# Patient Record
Sex: Female | Born: 1945 | Race: Black or African American | Hispanic: No | Smoking: Former smoker
Health system: Southern US, Community
[De-identification: ages and names within clinical notes are randomized; demographics above are authoritative.]

## PROBLEM LIST (undated history)

## (undated) DIAGNOSIS — E119 Type 2 diabetes mellitus without complications: Secondary | ICD-10-CM

## (undated) DIAGNOSIS — I10 Essential (primary) hypertension: Secondary | ICD-10-CM

## (undated) DIAGNOSIS — R002 Palpitations: Secondary | ICD-10-CM

## (undated) DIAGNOSIS — I499 Cardiac arrhythmia, unspecified: Secondary | ICD-10-CM

## (undated) DIAGNOSIS — M1990 Unspecified osteoarthritis, unspecified site: Secondary | ICD-10-CM

## (undated) DIAGNOSIS — E1149 Type 2 diabetes mellitus with other diabetic neurological complication: Secondary | ICD-10-CM

## (undated) DIAGNOSIS — J9859 Other diseases of mediastinum, not elsewhere classified: Principal | ICD-10-CM

## (undated) DIAGNOSIS — D150 Benign neoplasm of thymus: Secondary | ICD-10-CM

## (undated) DIAGNOSIS — E7800 Pure hypercholesterolemia, unspecified: Secondary | ICD-10-CM

## (undated) HISTORY — DX: Palpitations: R00.2

## (undated) HISTORY — DX: Other diseases of mediastinum, not elsewhere classified: J98.59

## (undated) HISTORY — DX: Type 2 diabetes mellitus without complications: E11.9

## (undated) HISTORY — DX: Type 2 diabetes mellitus with other diabetic neurological complication: E11.49

## (undated) HISTORY — DX: Essential (primary) hypertension: I10

---

## 1999-12-13 ENCOUNTER — Encounter: Admission: RE | Admit: 1999-12-13 | Discharge: 2000-03-12 | Payer: Self-pay | Admitting: Family Medicine

## 2004-05-19 ENCOUNTER — Ambulatory Visit: Payer: Self-pay | Admitting: Internal Medicine

## 2004-06-09 ENCOUNTER — Ambulatory Visit: Payer: Self-pay | Admitting: Internal Medicine

## 2004-06-21 ENCOUNTER — Ambulatory Visit: Payer: Self-pay | Admitting: *Deleted

## 2004-07-10 ENCOUNTER — Ambulatory Visit: Payer: Self-pay | Admitting: Internal Medicine

## 2004-07-14 ENCOUNTER — Ambulatory Visit (HOSPITAL_COMMUNITY): Admission: RE | Admit: 2004-07-14 | Discharge: 2004-07-14 | Payer: Self-pay | Admitting: Internal Medicine

## 2004-07-24 ENCOUNTER — Ambulatory Visit (HOSPITAL_COMMUNITY): Admission: RE | Admit: 2004-07-24 | Discharge: 2004-07-24 | Payer: Self-pay | Admitting: Family Medicine

## 2004-07-27 ENCOUNTER — Ambulatory Visit: Payer: Self-pay | Admitting: Internal Medicine

## 2004-10-20 ENCOUNTER — Ambulatory Visit: Payer: Self-pay | Admitting: Family Medicine

## 2004-11-02 ENCOUNTER — Ambulatory Visit: Payer: Self-pay | Admitting: Internal Medicine

## 2004-12-14 ENCOUNTER — Ambulatory Visit: Payer: Self-pay | Admitting: Internal Medicine

## 2005-02-19 ENCOUNTER — Ambulatory Visit: Payer: Self-pay | Admitting: Internal Medicine

## 2005-09-14 ENCOUNTER — Ambulatory Visit: Payer: Self-pay | Admitting: Internal Medicine

## 2005-10-01 ENCOUNTER — Ambulatory Visit: Payer: Self-pay | Admitting: Internal Medicine

## 2005-10-03 ENCOUNTER — Ambulatory Visit: Payer: Self-pay | Admitting: Internal Medicine

## 2005-10-08 ENCOUNTER — Ambulatory Visit: Payer: Self-pay | Admitting: Internal Medicine

## 2006-01-07 ENCOUNTER — Ambulatory Visit: Payer: Self-pay | Admitting: Internal Medicine

## 2006-03-05 ENCOUNTER — Encounter: Payer: Self-pay | Admitting: Internal Medicine

## 2006-03-05 ENCOUNTER — Ambulatory Visit: Payer: Self-pay | Admitting: Internal Medicine

## 2006-04-16 ENCOUNTER — Ambulatory Visit: Payer: Self-pay | Admitting: Internal Medicine

## 2006-05-01 ENCOUNTER — Ambulatory Visit: Payer: Self-pay | Admitting: Internal Medicine

## 2006-05-06 ENCOUNTER — Ambulatory Visit: Payer: Self-pay | Admitting: Internal Medicine

## 2006-05-09 ENCOUNTER — Ambulatory Visit: Payer: Self-pay | Admitting: Internal Medicine

## 2006-07-02 ENCOUNTER — Ambulatory Visit: Payer: Self-pay | Admitting: Internal Medicine

## 2006-08-19 ENCOUNTER — Ambulatory Visit: Payer: Self-pay | Admitting: Internal Medicine

## 2006-11-01 ENCOUNTER — Ambulatory Visit: Payer: Self-pay | Admitting: Internal Medicine

## 2006-11-12 ENCOUNTER — Ambulatory Visit: Payer: Self-pay | Admitting: Internal Medicine

## 2006-11-14 ENCOUNTER — Ambulatory Visit (HOSPITAL_COMMUNITY): Admission: RE | Admit: 2006-11-14 | Discharge: 2006-11-14 | Payer: Self-pay | Admitting: Internal Medicine

## 2007-02-17 DIAGNOSIS — I10 Essential (primary) hypertension: Secondary | ICD-10-CM

## 2007-02-17 DIAGNOSIS — F17200 Nicotine dependence, unspecified, uncomplicated: Secondary | ICD-10-CM

## 2007-02-17 DIAGNOSIS — E669 Obesity, unspecified: Secondary | ICD-10-CM | POA: Insufficient documentation

## 2007-02-17 DIAGNOSIS — R319 Hematuria, unspecified: Secondary | ICD-10-CM | POA: Insufficient documentation

## 2007-02-17 DIAGNOSIS — E1149 Type 2 diabetes mellitus with other diabetic neurological complication: Secondary | ICD-10-CM | POA: Insufficient documentation

## 2007-02-17 DIAGNOSIS — E119 Type 2 diabetes mellitus without complications: Secondary | ICD-10-CM | POA: Insufficient documentation

## 2007-04-18 ENCOUNTER — Ambulatory Visit: Payer: Self-pay | Admitting: Internal Medicine

## 2007-05-28 ENCOUNTER — Encounter (INDEPENDENT_AMBULATORY_CARE_PROVIDER_SITE_OTHER): Payer: Self-pay | Admitting: *Deleted

## 2007-08-01 ENCOUNTER — Ambulatory Visit: Payer: Self-pay | Admitting: Family Medicine

## 2008-01-07 ENCOUNTER — Ambulatory Visit: Payer: Self-pay | Admitting: Internal Medicine

## 2008-01-07 ENCOUNTER — Encounter: Payer: Self-pay | Admitting: Family Medicine

## 2008-01-07 LAB — CONVERTED CEMR LAB
ALT: 12 units/L (ref 0–35)
AST: 14 units/L (ref 0–37)
Albumin: 3.9 g/dL (ref 3.5–5.2)
Alkaline Phosphatase: 57 units/L (ref 39–117)
BUN: 16 mg/dL (ref 6–23)
CO2: 26 meq/L (ref 19–32)
Calcium: 9.4 mg/dL (ref 8.4–10.5)
Chloride: 104 meq/L (ref 96–112)
Cholesterol: 138 mg/dL (ref 0–200)
Creatinine, Ser: 0.68 mg/dL (ref 0.40–1.20)
Glucose, Bld: 131 mg/dL — ABNORMAL HIGH (ref 70–99)
HDL: 39 mg/dL — ABNORMAL LOW (ref >39)
LDL Cholesterol: 81 mg/dL (ref 0–99)
Potassium: 4.1 meq/L (ref 3.5–5.3)
Sodium: 143 meq/L (ref 135–145)
Total Bilirubin: 0.4 mg/dL (ref 0.3–1.2)
Total CHOL/HDL Ratio: 3.5
Total Protein: 7 g/dL (ref 6.0–8.3)
Triglycerides: 90 mg/dL (ref <150)
VLDL: 18 mg/dL (ref 0–40)

## 2008-02-05 ENCOUNTER — Ambulatory Visit: Payer: Self-pay | Admitting: Internal Medicine

## 2008-02-20 ENCOUNTER — Ambulatory Visit: Payer: Self-pay | Admitting: Internal Medicine

## 2008-04-10 ENCOUNTER — Emergency Department (HOSPITAL_COMMUNITY): Admission: EM | Admit: 2008-04-10 | Discharge: 2008-04-10 | Payer: Self-pay | Admitting: Emergency Medicine

## 2008-04-30 ENCOUNTER — Ambulatory Visit: Payer: Self-pay | Admitting: Internal Medicine

## 2008-09-22 ENCOUNTER — Ambulatory Visit: Payer: Self-pay | Admitting: Internal Medicine

## 2008-09-22 LAB — CONVERTED CEMR LAB
BUN: 15 mg/dL (ref 6–23)
CO2: 22 meq/L (ref 19–32)
Calcium: 9.3 mg/dL (ref 8.4–10.5)
Chloride: 102 meq/L (ref 96–112)
Cholesterol: 147 mg/dL (ref 0–200)
Creatinine, Ser: 0.62 mg/dL (ref 0.40–1.20)
Glucose, Bld: 101 mg/dL — ABNORMAL HIGH (ref 70–99)
HDL: 40 mg/dL (ref >39)
LDL Cholesterol: 89 mg/dL (ref 0–99)
Potassium: 4.2 meq/L (ref 3.5–5.3)
Sodium: 142 meq/L (ref 135–145)
Total CHOL/HDL Ratio: 3.7
Triglycerides: 92 mg/dL (ref <150)
VLDL: 18 mg/dL (ref 0–40)

## 2008-10-07 ENCOUNTER — Ambulatory Visit (HOSPITAL_COMMUNITY): Admission: RE | Admit: 2008-10-07 | Discharge: 2008-10-07 | Payer: Self-pay | Admitting: Ophthalmology

## 2009-01-01 ENCOUNTER — Emergency Department (HOSPITAL_COMMUNITY): Admission: EM | Admit: 2009-01-01 | Discharge: 2009-01-01 | Payer: Self-pay | Admitting: Emergency Medicine

## 2009-03-29 ENCOUNTER — Ambulatory Visit: Payer: Self-pay | Admitting: Internal Medicine

## 2009-04-08 ENCOUNTER — Ambulatory Visit (HOSPITAL_COMMUNITY): Admission: RE | Admit: 2009-04-08 | Discharge: 2009-04-08 | Payer: Self-pay | Admitting: Internal Medicine

## 2009-04-26 ENCOUNTER — Ambulatory Visit (HOSPITAL_COMMUNITY): Admission: RE | Admit: 2009-04-26 | Discharge: 2009-04-26 | Payer: Self-pay | Admitting: Ophthalmology

## 2009-05-09 ENCOUNTER — Ambulatory Visit: Payer: Self-pay | Admitting: Internal Medicine

## 2009-05-09 LAB — CONVERTED CEMR LAB
ALT: 15 units/L (ref 0–35)
AST: 18 units/L (ref 0–37)
Albumin: 4 g/dL (ref 3.5–5.2)
Alkaline Phosphatase: 56 units/L (ref 39–117)
BUN: 24 mg/dL — ABNORMAL HIGH (ref 6–23)
Basophils Absolute: 0 10*3/uL (ref 0.0–0.1)
Basophils Relative: 0 % (ref 0–1)
CO2: 25 meq/L (ref 19–32)
Calcium: 9.6 mg/dL (ref 8.4–10.5)
Chloride: 104 meq/L (ref 96–112)
Cholesterol: 143 mg/dL (ref 0–200)
Creatinine, Ser: 0.76 mg/dL (ref 0.40–1.20)
Eosinophils Absolute: 0.2 10*3/uL (ref 0.0–0.7)
Eosinophils Relative: 3 % (ref 0–5)
Glucose, Bld: 120 mg/dL — ABNORMAL HIGH (ref 70–99)
HCT: 37.9 % (ref 36.0–46.0)
HDL: 38 mg/dL — ABNORMAL LOW (ref >39)
Hemoglobin: 12.2 g/dL (ref 12.0–15.0)
LDL Cholesterol: 87 mg/dL (ref 0–99)
Lymphocytes Relative: 46 % (ref 12–46)
Lymphs Abs: 3 10*3/uL (ref 0.7–4.0)
MCHC: 32.2 g/dL (ref 30.0–36.0)
MCV: 92.2 fL (ref 78.0–100.0)
Monocytes Absolute: 0.5 10*3/uL (ref 0.1–1.0)
Monocytes Relative: 7 % (ref 3–12)
Neutro Abs: 2.8 10*3/uL (ref 1.7–7.7)
Neutrophils Relative %: 44 % (ref 43–77)
Platelets: 316 10*3/uL (ref 150–400)
Potassium: 4.6 meq/L (ref 3.5–5.3)
RBC: 4.11 M/uL (ref 3.87–5.11)
RDW: 15.3 % (ref 11.5–15.5)
Sodium: 141 meq/L (ref 135–145)
Total Bilirubin: 0.4 mg/dL (ref 0.3–1.2)
Total CHOL/HDL Ratio: 3.8
Total Protein: 7.2 g/dL (ref 6.0–8.3)
Triglycerides: 92 mg/dL (ref <150)
VLDL: 18 mg/dL (ref 0–40)
Vit D, 25-Hydroxy: 13 ng/mL — ABNORMAL LOW (ref 30–89)
WBC: 6.5 10*3/uL (ref 4.0–10.5)

## 2009-05-25 ENCOUNTER — Ambulatory Visit: Payer: Self-pay | Admitting: Internal Medicine

## 2009-06-16 ENCOUNTER — Ambulatory Visit (HOSPITAL_COMMUNITY): Admission: RE | Admit: 2009-06-16 | Discharge: 2009-06-16 | Payer: Self-pay | Admitting: Ophthalmology

## 2009-07-28 ENCOUNTER — Ambulatory Visit: Payer: Self-pay | Admitting: Internal Medicine

## 2009-09-10 HISTORY — PX: OTHER SURGICAL HISTORY: SHX169

## 2009-10-24 ENCOUNTER — Ambulatory Visit: Payer: Self-pay | Admitting: Family Medicine

## 2010-05-02 ENCOUNTER — Ambulatory Visit: Payer: Self-pay | Admitting: Internal Medicine

## 2010-07-04 ENCOUNTER — Ambulatory Visit: Payer: Self-pay | Admitting: Internal Medicine

## 2010-07-26 ENCOUNTER — Ambulatory Visit: Payer: Self-pay | Admitting: Internal Medicine

## 2010-12-14 LAB — GLUCOSE, CAPILLARY
Glucose-Capillary: 101 mg/dL — ABNORMAL HIGH (ref 70–99)
Glucose-Capillary: 97 mg/dL (ref 70–99)

## 2010-12-14 LAB — URINALYSIS, ROUTINE W REFLEX MICROSCOPIC
Bilirubin Urine: NEGATIVE
Glucose, UA: NEGATIVE mg/dL
Ketones, ur: NEGATIVE mg/dL
Leukocytes, UA: NEGATIVE
Nitrite: NEGATIVE
Protein, ur: NEGATIVE mg/dL
Specific Gravity, Urine: 1.021 (ref 1.005–1.030)
Urobilinogen, UA: 0.2 mg/dL (ref 0.0–1.0)
pH: 5.5 (ref 5.0–8.0)

## 2010-12-14 LAB — CBC
HCT: 36.7 % (ref 36.0–46.0)
Hemoglobin: 12.3 g/dL (ref 12.0–15.0)
MCHC: 33.4 g/dL (ref 30.0–36.0)
MCV: 93.4 fL (ref 78.0–100.0)
Platelets: 277 10*3/uL (ref 150–400)
RBC: 3.93 MIL/uL (ref 3.87–5.11)
RDW: 14.2 % (ref 11.5–15.5)
WBC: 6.8 10*3/uL (ref 4.0–10.5)

## 2010-12-14 LAB — COMPREHENSIVE METABOLIC PANEL
ALT: 16 U/L (ref 0–35)
AST: 19 U/L (ref 0–37)
Albumin: 3.5 g/dL (ref 3.5–5.2)
Alkaline Phosphatase: 57 U/L (ref 39–117)
BUN: 15 mg/dL (ref 6–23)
CO2: 34 mEq/L — ABNORMAL HIGH (ref 19–32)
Calcium: 10.2 mg/dL (ref 8.4–10.5)
Chloride: 102 mEq/L (ref 96–112)
Creatinine, Ser: 0.73 mg/dL (ref 0.4–1.2)
GFR calc Af Amer: >60 mL/min (ref >60)
GFR calc non Af Amer: >60 mL/min (ref >60)
Glucose, Bld: 191 mg/dL — ABNORMAL HIGH (ref 70–99)
Potassium: 4.3 mEq/L (ref 3.5–5.1)
Sodium: 140 mEq/L (ref 135–145)
Total Bilirubin: 0.6 mg/dL (ref 0.3–1.2)
Total Protein: 6.8 g/dL (ref 6.0–8.3)

## 2010-12-14 LAB — URINE MICROSCOPIC-ADD ON

## 2010-12-14 LAB — DIFFERENTIAL
Basophils Absolute: 0 10*3/uL (ref 0.0–0.1)
Basophils Relative: 0 % (ref 0–1)
Eosinophils Absolute: 0.2 10*3/uL (ref 0.0–0.7)
Eosinophils Relative: 3 % (ref 0–5)
Lymphocytes Relative: 38 % (ref 12–46)
Lymphs Abs: 2.6 10*3/uL (ref 0.7–4.0)
Monocytes Absolute: 0.5 10*3/uL (ref 0.1–1.0)
Monocytes Relative: 8 % (ref 3–12)
Neutro Abs: 3.4 10*3/uL (ref 1.7–7.7)
Neutrophils Relative %: 51 % (ref 43–77)

## 2010-12-25 LAB — URINALYSIS, ROUTINE W REFLEX MICROSCOPIC
Bilirubin Urine: NEGATIVE
Glucose, UA: NEGATIVE mg/dL
Ketones, ur: NEGATIVE mg/dL
Leukocytes, UA: NEGATIVE
Nitrite: NEGATIVE
Protein, ur: NEGATIVE mg/dL
Specific Gravity, Urine: 1.018 (ref 1.005–1.030)
Urobilinogen, UA: 1 mg/dL (ref 0.0–1.0)
pH: 5.5 (ref 5.0–8.0)

## 2010-12-25 LAB — BASIC METABOLIC PANEL
BUN: 16 mg/dL (ref 6–23)
CO2: 31 mEq/L (ref 19–32)
Calcium: 10 mg/dL (ref 8.4–10.5)
Chloride: 103 mEq/L (ref 96–112)
Creatinine, Ser: 0.64 mg/dL (ref 0.4–1.2)
GFR calc Af Amer: >60 mL/min (ref >60)
GFR calc non Af Amer: >60 mL/min (ref >60)
Glucose, Bld: 123 mg/dL — ABNORMAL HIGH (ref 70–99)
Potassium: 4.9 mEq/L (ref 3.5–5.1)
Sodium: 141 mEq/L (ref 135–145)

## 2010-12-25 LAB — CBC
HCT: 40.7 % (ref 36.0–46.0)
Hemoglobin: 13.2 g/dL (ref 12.0–15.0)
MCHC: 32.4 g/dL (ref 30.0–36.0)
MCV: 91.3 fL (ref 78.0–100.0)
Platelets: 285 10*3/uL (ref 150–400)
RBC: 4.45 MIL/uL (ref 3.87–5.11)
RDW: 14.3 % (ref 11.5–15.5)
WBC: 7.6 10*3/uL (ref 4.0–10.5)

## 2010-12-25 LAB — URINE MICROSCOPIC-ADD ON

## 2010-12-25 LAB — GLUCOSE, CAPILLARY
Glucose-Capillary: 103 mg/dL — ABNORMAL HIGH (ref 70–99)
Glucose-Capillary: 115 mg/dL — ABNORMAL HIGH (ref 70–99)
Glucose-Capillary: 94 mg/dL (ref 70–99)

## 2011-01-26 NOTE — Op Note (Signed)
Samantha [name], Samantha [name]              ACCOUNT NO.:  [id number]   MEDICAL RECORD NO.:  [id number]          PATIENT TYPE:  AMB   LOCATION:  SDS                          FACILITY:  [facility]   PHYSICIAN:  Salley Scarlet., M.D.DATE OF BIRTH:  09-25-1945   DATE OF PROCEDURE:  DATE OF DISCHARGE:  10/07/2008                               OPERATIVE REPORT   PREOPERATIVE DIAGNOSIS:  Immature cataract, right eye.   POSTOPERATIVE DIAGNOSIS:  Immature cataract, right eye.   OPERATION:  Kelman phacoemulsification cataract, right eye.   ANESTHESIA:  Local using Xylocaine, 2% with Marcaine, 0.75% [place].   JUSTIFICATION OF PROCEDURE:  This is a 65 year old diabetic lady who  complains of blurring of vision with difficulty seeing to read and  drive.  She was evaluated and found to have bilateral posterior  subcapsular cataracts, slightly worse on the right than the left with a  visual acuity best corrected to 20/200 in each eye.  She has a history  of diabetic eye disease and she has had laser surgery for the treatment  of diabetic retinopathy.  Because of worsening in her visual acuity,  cataract extraction with intraocular lens implantation was recommended.  She is admitted at this time for that purpose.   PROCEDURE:  Under the influence of IV sedation, a Van Lint akinesia and  retrobulbar anesthesia was given.  The patient was prepped and draped in  the usual manner.  The lid speculum was inserted under the upper and  lower lid of the right eye and a 4-0 silk traction suture was passed  through the belly of the superior rectus muscle for traction.  A fornix-  based conjunctival flap was turned and hemostasis achieved by using  cautery.  An incision was made in the sclera at the limbus.  This  incision was dissected down into the cornea using a crescent blade.  A  sideport incision was made at the 1:30 o'clock position.  OcuCoat was  injected into the eye through the sideport incision.  The  anterior  chamber was then entered through the corneoscleral tunnel incision at  the 11:30 o'clock position using a 2.75-mm keratome.  An anterior  capsulotomy was done using a bent 25-gauge needle.  The nucleus was  hydrodissected using Xylocaine.  The KPE handpiece was passed into the  eye and the nucleus was emulsified without difficulty.  The residual  cortical material was aspirated.  The posterior capsule was polished  using olive-tip polisher.  The wound was widened slightly to accommodate  a foldable silicone lens.  The lens was seated into the eye behind the  iris without difficulty.  The anterior chamber was reformed and the  pupils was constricted using Miochol.  The lips of the wound were  hydrated and tested to make sure that there was no leak.  After  ascertaining that there is no leak, the conjunctiva was closed over the  wound using thermal cautery.  A 1 mL of Celestone, 0.5 mL of gentamicin  were then injected subconjunctivally.  Maxitrol ophthalmic ointment and  prilocaine ointment were applied along with a patch and  Fox shield.  The  patient tolerated the procedure well and was discharged to the  post anesthesia recovery in a satisfactory condition.  She was  instructed to rest today, to take Tylenol every 4 hours as needed for  pain, and see me in office tomorrow for further evaluation.   DISCHARGE DIAGNOSIS:  Immature cataract, right eye.      Salley Scarlet., M.D.  Electronically Signed     TB/MEDQ  D:  10/18/2008  T:  10/19/2008  Job:  [id number]

## 2011-05-03 ENCOUNTER — Emergency Department (HOSPITAL_COMMUNITY): Payer: Self-pay

## 2011-05-03 ENCOUNTER — Emergency Department (HOSPITAL_COMMUNITY)
Admission: EM | Admit: 2011-05-03 | Discharge: 2011-05-04 | Disposition: A | Discharge status: Home / Self Care | Payer: Self-pay | Attending: Emergency Medicine | Admitting: Emergency Medicine

## 2011-05-03 DIAGNOSIS — E119 Type 2 diabetes mellitus without complications: Secondary | ICD-10-CM | POA: Insufficient documentation

## 2011-05-03 DIAGNOSIS — E7800 Pure hypercholesterolemia, unspecified: Secondary | ICD-10-CM | POA: Insufficient documentation

## 2011-05-03 DIAGNOSIS — I10 Essential (primary) hypertension: Secondary | ICD-10-CM | POA: Insufficient documentation

## 2011-05-03 DIAGNOSIS — R002 Palpitations: Secondary | ICD-10-CM | POA: Insufficient documentation

## 2011-05-03 DIAGNOSIS — I498 Other specified cardiac arrhythmias: Secondary | ICD-10-CM | POA: Insufficient documentation

## 2011-05-03 DIAGNOSIS — R350 Frequency of micturition: Secondary | ICD-10-CM | POA: Insufficient documentation

## 2011-05-03 LAB — COMPREHENSIVE METABOLIC PANEL
ALT: 16 U/L (ref 0–35)
AST: 17 U/L (ref 0–37)
Alkaline Phosphatase: 65 U/L (ref 39–117)
GFR calc Af Amer: >60 mL/min (ref >60)
Glucose, Bld: 153 mg/dL — ABNORMAL HIGH (ref 70–99)
Potassium: 4.4 mEq/L (ref 3.5–5.1)
Sodium: 135 mEq/L (ref 135–145)
Total Protein: 7.5 g/dL (ref 6.0–8.3)

## 2011-05-03 LAB — DIFFERENTIAL
Eosinophils Absolute: 0.1 10*3/uL (ref 0.0–0.7)
Lymphocytes Relative: 25 % (ref 12–46)
Lymphs Abs: 2.8 10*3/uL (ref 0.7–4.0)
Neutrophils Relative %: 66 % (ref 43–77)

## 2011-05-03 LAB — URINALYSIS, ROUTINE W REFLEX MICROSCOPIC
Bilirubin Urine: NEGATIVE
Glucose, UA: NEGATIVE mg/dL
Ketones, ur: NEGATIVE mg/dL
Nitrite: NEGATIVE
pH: 5.5 (ref 5.0–8.0)

## 2011-05-03 LAB — CBC
HCT: 35.8 % — ABNORMAL LOW (ref 36.0–46.0)
MCV: 88.6 fL (ref 78.0–100.0)
Platelets: 309 10*3/uL (ref 150–400)
RBC: 4.04 MIL/uL (ref 3.87–5.11)
WBC: 10.9 10*3/uL — ABNORMAL HIGH (ref 4.0–10.5)

## 2011-05-03 LAB — URINE MICROSCOPIC-ADD ON

## 2011-05-03 LAB — POCT I-STAT TROPONIN I

## 2011-06-27 ENCOUNTER — Encounter: Payer: Self-pay | Admitting: *Deleted

## 2011-06-27 ENCOUNTER — Encounter: Payer: Self-pay | Admitting: Cardiovascular Disease

## 2011-06-28 ENCOUNTER — Ambulatory Visit (INDEPENDENT_AMBULATORY_CARE_PROVIDER_SITE_OTHER): Payer: Self-pay | Admitting: Cardiovascular Disease

## 2011-06-28 ENCOUNTER — Encounter: Payer: Self-pay | Admitting: Cardiovascular Disease

## 2011-06-28 VITALS — BP 127/74 | HR 99 | Ht 62.0 in | Wt 233.0 lb

## 2011-06-28 DIAGNOSIS — I10 Essential (primary) hypertension: Secondary | ICD-10-CM

## 2011-06-28 DIAGNOSIS — R002 Palpitations: Secondary | ICD-10-CM | POA: Insufficient documentation

## 2011-06-28 DIAGNOSIS — R0600 Dyspnea, unspecified: Secondary | ICD-10-CM | POA: Insufficient documentation

## 2011-06-28 DIAGNOSIS — R0989 Other specified symptoms and signs involving the circulatory and respiratory systems: Secondary | ICD-10-CM

## 2011-06-28 DIAGNOSIS — E119 Type 2 diabetes mellitus without complications: Secondary | ICD-10-CM

## 2011-06-28 NOTE — Assessment & Plan Note (Signed)
Likely functional from obesity  Echo

## 2011-06-28 NOTE — Patient Instructions (Signed)
Your physician recommends that you schedule a follow-up appointment in: AS NEEDED Your physician recommends that you continue on your current medications as directed. Please refer to the Current Medication list given to you today.  Your physician has requested that you have an echocardiogram. Echocardiography is a painless test that uses sound waves to create images of your heart. It provides your doctor with information about the size and shape of your heart and how well your heart's chambers and valves are working. This procedure takes approximately one hour. There are no restrictions for this procedure. DX. PALPITATIONS

## 2011-06-28 NOTE — Progress Notes (Signed)
65 yo obese diabetic referred by [facility].  Isolated episode of palpitations in August.  Lasted a few minutes.  No associated SSCP syncope or dyspnea.  Mild chronic dyspnea that sounds functional.  Obese with poor diet and sedentary.  Doesn't do much but watch t.v.  She has no previous history of heart disease.  No previous w/u.  Does not need monitor since she has not had any recurrent episodes of palpitations since August.  Mostly compliant with meds.  Does not check her BS everyday.    ROS: Denies fever, malais, weight loss, blurry vision, decreased visual acuity, cough, sputum, SOB, hemoptysis, pleuritic pain, palpitaitons, heartburn, abdominal pain, melena, lower extremity edema, claudication, or rash.  All other systems reviewed and negative   General: Affect appropriate Healthy:  appears stated age HEENT: normal Neck supple with no adenopathy JVP normal no bruits no thyromegaly Lungs clear with no wheezing and good diaphragmatic motion Heart:  S1/S2 no murmur,rub, gallop or click PMI normal Abdomen: benighn, BS positve, no tenderness, no AAA no bruit.  No HSM or HJR Distal pulses intact with no bruits No edema Neuro non-focal Skin warm and dry No muscular weakness  Medications Current Outpatient Prescriptions  Medication Sig Dispense Refill  . acetaminophen (TYLENOL) 500 MG tablet Take 500 mg by mouth every 6 (six) hours as needed.        Marland Kitchen aspirin 81 MG tablet Take 81 mg by mouth daily.        Marland Kitchen atorvastatin (LIPITOR) 10 MG tablet Take 10 mg by mouth daily.        Marland Kitchen glipiZIDE (GLUCOTROL) 10 MG tablet Take 10 mg by mouth daily.        Marland Kitchen lisinopril-hydrochlorothiazide (PRINZIDE,ZESTORETIC) 20-25 MG per tablet Take 1 tablet by mouth daily.        Marland Kitchen loratadine (CLARITIN) 10 MG tablet Take 10 mg by mouth daily.        . metFORMIN (GLUCOPHAGE) 1000 MG tablet Take 1,000 mg by mouth 2 (two) times daily with a meal.          Allergies Review of patient's allergies  indicates no known allergies.  Family History: No family history on file.  Social History: History   Social History  . Marital Status: Married    Spouse Name: N/A    Number of Children: N/A  . Years of Education: N/A   Occupational History  . Not on file.   Social History Main Topics  . Smoking status: Former Games developer  . Smokeless tobacco: Not on file  . Alcohol Use: Not on file  . Drug Use: Not on file  . Sexually Active: Not on file   Other Topics Concern  . Not on file   Social History Narrative  . No narrative on file    Electrocardiogram:  :04/18/11  Primary office.  ST 100 borderline LVH  Today  NSR 99 [facility]  Assessment and Plan

## 2011-06-28 NOTE — Assessment & Plan Note (Signed)
Functional check echo to make sure EF normal

## 2011-06-28 NOTE — Assessment & Plan Note (Signed)
Isolated episode.  No need for monitor. Echo given dyspnea and diabetes

## 2011-06-28 NOTE — Assessment & Plan Note (Signed)
Discussed low carb diet.  Obese.  Not motivated to loose weight.  Sedentary.  Continue current meds.  Target A1c 6.5 or less

## 2011-06-28 NOTE — Assessment & Plan Note (Signed)
Well controlled.  Continue current medications and low sodium Dash type diet.    

## 2011-07-05 ENCOUNTER — Ambulatory Visit (HOSPITAL_COMMUNITY): Payer: Self-pay | Attending: Cardiovascular Disease | Admitting: Radiology

## 2011-07-05 DIAGNOSIS — E119 Type 2 diabetes mellitus without complications: Secondary | ICD-10-CM | POA: Insufficient documentation

## 2011-07-05 DIAGNOSIS — I10 Essential (primary) hypertension: Secondary | ICD-10-CM | POA: Insufficient documentation

## 2011-07-05 DIAGNOSIS — I059 Rheumatic mitral valve disease, unspecified: Secondary | ICD-10-CM | POA: Insufficient documentation

## 2011-07-05 DIAGNOSIS — R002 Palpitations: Secondary | ICD-10-CM | POA: Insufficient documentation

## 2011-07-10 ENCOUNTER — Other Ambulatory Visit (HOSPITAL_COMMUNITY): Payer: Self-pay | Admitting: Radiology

## 2011-08-06 ENCOUNTER — Encounter: Payer: Self-pay | Admitting: Cardiovascular Disease

## 2011-10-18 ENCOUNTER — Emergency Department (HOSPITAL_COMMUNITY): Payer: Self-pay

## 2011-10-18 ENCOUNTER — Other Ambulatory Visit: Payer: Self-pay

## 2011-10-18 ENCOUNTER — Encounter (HOSPITAL_COMMUNITY): Payer: Self-pay | Admitting: Emergency Medicine

## 2011-10-18 ENCOUNTER — Inpatient Hospital Stay (HOSPITAL_COMMUNITY)
Admission: EM | Admit: 2011-10-18 | Discharge: 2011-10-19 | DRG: 641 | Disposition: A | Discharge status: Home / Self Care | Payer: Self-pay | Attending: Family Medicine | Admitting: Family Medicine

## 2011-10-18 DIAGNOSIS — R002 Palpitations: Secondary | ICD-10-CM | POA: Diagnosis present

## 2011-10-18 DIAGNOSIS — I10 Essential (primary) hypertension: Secondary | ICD-10-CM

## 2011-10-18 DIAGNOSIS — E871 Hypo-osmolality and hyponatremia: Principal | ICD-10-CM | POA: Diagnosis present

## 2011-10-18 DIAGNOSIS — J9859 Other diseases of mediastinum, not elsewhere classified: Secondary | ICD-10-CM

## 2011-10-18 DIAGNOSIS — E861 Hypovolemia: Secondary | ICD-10-CM | POA: Diagnosis present

## 2011-10-18 DIAGNOSIS — R222 Localized swelling, mass and lump, trunk: Secondary | ICD-10-CM

## 2011-10-18 DIAGNOSIS — E118 Type 2 diabetes mellitus with unspecified complications: Secondary | ICD-10-CM | POA: Diagnosis present

## 2011-10-18 DIAGNOSIS — Z79899 Other long term (current) drug therapy: Secondary | ICD-10-CM

## 2011-10-18 DIAGNOSIS — Z87891 Personal history of nicotine dependence: Secondary | ICD-10-CM

## 2011-10-18 DIAGNOSIS — E119 Type 2 diabetes mellitus without complications: Secondary | ICD-10-CM

## 2011-10-18 HISTORY — DX: Other diseases of mediastinum, not elsewhere classified: J98.59

## 2011-10-18 HISTORY — DX: Cardiac arrhythmia, unspecified: I49.9

## 2011-10-18 LAB — HEMOGLOBIN A1C: Hgb A1c MFr Bld: 6.6 % — ABNORMAL HIGH (ref <5.7)

## 2011-10-18 LAB — URINALYSIS, ROUTINE W REFLEX MICROSCOPIC
Bilirubin Urine: NEGATIVE
Nitrite: NEGATIVE
Specific Gravity, Urine: 1.006 (ref 1.005–1.030)
Urobilinogen, UA: 0.2 mg/dL (ref 0.0–1.0)
pH: 6 (ref 5.0–8.0)

## 2011-10-18 LAB — BASIC METABOLIC PANEL
BUN: 24 mg/dL — ABNORMAL HIGH (ref 6–23)
Calcium: 10 mg/dL (ref 8.4–10.5)
Chloride: 88 mEq/L — ABNORMAL LOW (ref 96–112)
GFR calc Af Amer: 62 mL/min — ABNORMAL LOW (ref >90)
GFR calc non Af Amer: 53 mL/min — ABNORMAL LOW (ref >90)
GFR calc non Af Amer: 56 mL/min — ABNORMAL LOW (ref >90)
Glucose, Bld: 152 mg/dL — ABNORMAL HIGH (ref 70–99)
Glucose, Bld: 112 mg/dL — ABNORMAL HIGH (ref 70–99)
Potassium: 4.9 mEq/L (ref 3.5–5.1)
Sodium: 123 mEq/L — ABNORMAL LOW (ref 135–145)
Sodium: 131 mEq/L — ABNORMAL LOW (ref 135–145)

## 2011-10-18 LAB — GLUCOSE, CAPILLARY
Glucose-Capillary: 87 mg/dL (ref 70–99)
Glucose-Capillary: 108 mg/dL — ABNORMAL HIGH (ref 70–99)
Glucose-Capillary: 93 mg/dL (ref 70–99)
Glucose-Capillary: 88 mg/dL (ref 70–99)

## 2011-10-18 LAB — DIFFERENTIAL
Lymphs Abs: 2.2 10*3/uL (ref 0.7–4.0)
Monocytes Relative: 9 % (ref 3–12)
Neutro Abs: 4.2 10*3/uL (ref 1.7–7.7)
Neutrophils Relative %: 59 % (ref 43–77)

## 2011-10-18 LAB — CREATININE, SERUM
Creatinine, Ser: 1.02 mg/dL (ref 0.50–1.10)
GFR calc Af Amer: 66 mL/min — ABNORMAL LOW (ref >90)

## 2011-10-18 LAB — CBC
Hemoglobin: 12.1 g/dL (ref 12.0–15.0)
Platelets: 305 10*3/uL (ref 150–400)
RBC: 4.02 MIL/uL (ref 3.87–5.11)
WBC: 7 10*3/uL (ref 4.0–10.5)

## 2011-10-18 LAB — TSH: TSH: 1.247 u[IU]/mL (ref 0.350–4.500)

## 2011-10-18 LAB — OSMOLALITY, URINE: Osmolality, Ur: 166 mOsm/kg — ABNORMAL LOW (ref 390–1090)

## 2011-10-18 LAB — URINE MICROSCOPIC-ADD ON

## 2011-10-18 MED ORDER — IOHEXOL 300 MG/ML  SOLN
100.0000 mL | Freq: Once | INTRAMUSCULAR | Status: AC | PRN
  Administered 2011-10-18: 100 mL via INTRAVENOUS

## 2011-10-18 MED ORDER — ROSUVASTATIN CALCIUM 10 MG PO TABS
10.0000 mg | ORAL_TABLET | Freq: Every day | ORAL | Status: DC
  Administered 2011-10-18: 10 mg via ORAL
  Filled 2011-10-18 (×2): qty 1

## 2011-10-18 MED ORDER — LORATADINE 10 MG PO TABS
10.0000 mg | ORAL_TABLET | Freq: Every day | ORAL | Status: DC
  Administered 2011-10-18 – 2011-10-19 (×2): 10 mg via ORAL
  Filled 2011-10-18 (×2): qty 1

## 2011-10-18 MED ORDER — SODIUM CHLORIDE 0.9 % IV SOLN
INTRAVENOUS | Status: DC
  Administered 2011-10-18: 125 mL/h via INTRAVENOUS
  Administered 2011-10-18: 75 mL/h via INTRAVENOUS
  Administered 2011-10-19: 03:00:00 via INTRAVENOUS

## 2011-10-18 MED ORDER — HEPARIN SODIUM (PORCINE) 5000 UNIT/ML IJ SOLN
5000.0000 [IU] | Freq: Three times a day (TID) | INTRAMUSCULAR | Status: DC
  Administered 2011-10-18 – 2011-10-19 (×5): 5000 [IU] via SUBCUTANEOUS
  Filled 2011-10-18 (×7): qty 1

## 2011-10-18 MED ORDER — ACETAMINOPHEN 325 MG PO TABS
650.0000 mg | ORAL_TABLET | Freq: Four times a day (QID) | ORAL | Status: DC | PRN
  Administered 2011-10-18: 650 mg via ORAL
  Filled 2011-10-18: qty 2

## 2011-10-18 MED ORDER — ACETAMINOPHEN 500 MG PO TABS: 500.0000 mg | ORAL_TABLET | Freq: Four times a day (QID) | ORAL | Status: DC | PRN

## 2011-10-18 MED ORDER — ACETAMINOPHEN 650 MG RE SUPP: 650.0000 mg | Freq: Four times a day (QID) | RECTAL | Status: DC | PRN

## 2011-10-18 MED ORDER — ASPIRIN 81 MG PO TABS: 81.0000 mg | ORAL_TABLET | Freq: Every day | ORAL | Status: DC

## 2011-10-18 MED ORDER — SODIUM CHLORIDE 0.9 % IV BOLUS (SEPSIS)
500.0000 mL | Freq: Once | INTRAVENOUS | Status: AC
  Administered 2011-10-18: 500 mL via INTRAVENOUS

## 2011-10-18 MED ORDER — TRIAMCINOLONE ACETONIDE 0.1 % EX CREA
1.0000 "application " | TOPICAL_CREAM | Freq: Two times a day (BID) | CUTANEOUS | Status: DC
  Administered 2011-10-18 – 2011-10-19 (×3): 1 via TOPICAL
  Filled 2011-10-18: qty 15

## 2011-10-18 MED ORDER — INSULIN ASPART 100 UNIT/ML ~~LOC~~ SOLN
0.0000 [IU] | Freq: Three times a day (TID) | SUBCUTANEOUS | Status: DC
  Administered 2011-10-19: 2 [IU] via SUBCUTANEOUS
  Filled 2011-10-18: qty 3

## 2011-10-18 MED ORDER — ASPIRIN 81 MG PO CHEW
324.0000 mg | CHEWABLE_TABLET | Freq: Once | ORAL | Status: AC
  Administered 2011-10-18: 324 mg via ORAL
  Filled 2011-10-18: qty 4

## 2011-10-18 MED ORDER — ASPIRIN EC 81 MG PO TBEC
81.0000 mg | DELAYED_RELEASE_TABLET | Freq: Every day | ORAL | Status: DC
  Administered 2011-10-18 – 2011-10-19 (×2): 81 mg via ORAL
  Filled 2011-10-18 (×2): qty 1

## 2011-10-18 NOTE — H&P (Signed)
Samantha [name] is an 66 y.o. female.    Chief Complaint: palpitations, hyponatremia, mediastinal mass  HPI:   Patient is a 66 yo AAF who presents to [facility] with palpitations.  Started yesterday afternoon around 3PM.  Patient was sitting down when palpitations started.  Patient describes it was "rapid heart beat" but denies any associated CP, SOB, diaphoresis, nausea or vomiting.  These palpitations have occurred before and she has been evaluated by Cardiology outpatient.   According to patient's daughter, stress test and Echo were both normal.  ED course: Per EDP, patient was tachycardic initially but this resolved after IVF bolus.  A CTA was ordered which was negative for PE but incidental finding: lobular mediastinal mass.  Additionally, patient found to have hyponatremia (Na 123).  EKG was non-ischemic, troponin negative x 1.  Urine was negative, except moderate blood.  FPTS called to admit for hyponatremia.   Past Medical History  Diagnosis Date  . HYPERTENSION, BENIGN ESSENTIAL   . HEMATURIA, MICROSCOPIC   . DM   . DM W/NEURO MNFST, TYPE II, UNCONTROLLED   . Palpitations     Past Surgical History  Procedure Date  . Kelman phacoemulsification cataract    Family History: patient and daughter think an uncle had cancer, but do not know further details.  Otherwise, DM, HTN, and dementia run in immediate family.  Social History:  Reports that she has quit smoking one year ago, but used to smoke 1/2 PPD.  She reports that she does not use illicit drugs or alcohol.  She lives alone at home, husband recently passed away in 2011/04/20.  Has a daughter who lives in [place].  Allergies: No Known Allergies  Medications Prior to Admission  Medication Dose Route Frequency Provider Last Rate Last Dose  . 0.9 %  sodium chloride infusion   Intravenous Continuous Ivy Tye Savoy, MD      . acetaminophen (TYLENOL) tablet 650 mg  650 mg Oral Q6H PRN Ivy Tye Savoy, MD       Or  . acetaminophen  (TYLENOL) suppository 650 mg  650 mg Rectal Q6H PRN Ivy Tye Savoy, MD      . acetaminophen (TYLENOL) tablet 500 mg  500 mg Oral Q6H PRN Ivy Tye Savoy, MD      . aspirin chewable tablet 324 mg  324 mg Oral Once Cyndra Numbers, MD   324 mg at 10/18/11 0252  . aspirin tablet 81 mg  81 mg Oral Daily Ivy de Lawson Radar, MD      . heparin injection 5,000 Units  5,000 Units Subcutaneous Q8H Ivy de Lawson Radar, MD      . insulin aspart (novoLOG) injection 0-15 Units  0-15 Units Subcutaneous TID WC Ivy Tye Savoy, MD      . iohexol (OMNIPAQUE) 300 MG/ML solution 100 mL  100 mL Intravenous Once PRN Medication Radiologist, MD   100 mL at 10/18/11 0428  . loratadine (CLARITIN) tablet 10 mg  10 mg Oral Daily Ivy de Lawson Radar, MD      . rosuvastatin (CRESTOR) tablet 10 mg  10 mg Oral Daily Ivy de Lawson Radar, MD      . sodium chloride 0.9 % bolus 500 mL  500 mL Intravenous Once Cyndra Numbers, MD   500 mL at 10/18/11 0304  . triamcinolone cream (KENALOG) 0.1 % 1 application  1 application Topical BID Ivy Tye Savoy, MD       Medications Prior to  Admission  Medication Sig Dispense Refill  . acetaminophen (TYLENOL) 500 MG tablet Take 500 mg by mouth every 6 (six) hours as needed. For pain      . aspirin 81 MG tablet Take 81 mg by mouth daily.        Marland Kitchen glipiZIDE (GLUCOTROL) 10 MG tablet Take 10 mg by mouth daily.        Marland Kitchen lisinopril-hydrochlorothiazide (PRINZIDE,ZESTORETIC) 20-25 MG per tablet Take 1 tablet by mouth daily.        Marland Kitchen loratadine (CLARITIN) 10 MG tablet Take 10 mg by mouth daily.        . metFORMIN (GLUCOPHAGE) 1000 MG tablet Take 1,000 mg by mouth 2 (two) times daily with a meal.          Results for orders placed during the hospital encounter of 10/18/11 (from the past 48 hour(s))  CBC     Status: Abnormal   Collection Time   10/18/11  1:52 AM      Component Value Range Comment   WBC 7.0  4.0 - 10.5 (K/uL)    RBC 4.02  3.87 - 5.11 (MIL/uL)    Hemoglobin 12.1  12.0 - 15.0 (g/dL)    HCT 16.1 (*) 09.6 - 46.0  (%)    MCV 86.6  78.0 - 100.0 (fL)    MCH 30.1  26.0 - 34.0 (pg)    MCHC 34.8  30.0 - 36.0 (g/dL)    RDW 04.5  40.9 - 81.1 (%)    Platelets 305  150 - 400 (K/uL)   DIFFERENTIAL     Status: Normal   Collection Time   10/18/11  1:52 AM      Component Value Range Comment   Neutrophils Relative 59  43 - 77 (%)    Neutro Abs 4.2  1.7 - 7.7 (K/uL)    Lymphocytes Relative 31  12 - 46 (%)    Lymphs Abs 2.2  0.7 - 4.0 (K/uL)    Monocytes Relative 9  3 - 12 (%)    Monocytes Absolute 0.6  0.1 - 1.0 (K/uL)    Eosinophils Relative 0  0 - 5 (%)    Eosinophils Absolute 0.0  0.0 - 0.7 (K/uL)    Basophils Relative 0  0 - 1 (%)    Basophils Absolute 0.0  0.0 - 0.1 (K/uL)   BASIC METABOLIC PANEL     Status: Abnormal   Collection Time   10/18/11  1:52 AM      Component Value Range Comment   Sodium 123 (*) 135 - 145 (mEq/L)    Potassium 4.9  3.5 - 5.1 (mEq/L)    Chloride 88 (*) 96 - 112 (mEq/L)    CO2 23  19 - 32 (mEq/L)    Glucose, Bld 152 (*) 70 - 99 (mg/dL)    BUN 24 (*) 6 - 23 (mg/dL)    Creatinine, Ser 9.14  0.50 - 1.10 (mg/dL)    Calcium 78.2  8.4 - 10.5 (mg/dL)    GFR calc non Af Amer 53 (*) >90 (mL/min)    GFR calc Af Amer 62 (*) >90 (mL/min)   POCT I-STAT TROPONIN I     Status: Normal   Collection Time   10/18/11  2:10 AM      Component Value Range Comment   Troponin i, poc 0.00  0.00 - 0.08 (ng/mL)    Comment 3            URINALYSIS, ROUTINE W REFLEX MICROSCOPIC  Status: Abnormal   Collection Time   10/18/11  3:18 AM      Component Value Range Comment   Color, Urine YELLOW  YELLOW     APPearance CLEAR  CLEAR     Specific Gravity, Urine 1.006  1.005 - 1.030     pH 6.0  5.0 - 8.0     Glucose, UA NEGATIVE  NEGATIVE (mg/dL)    Hgb urine dipstick MODERATE (*) NEGATIVE     Bilirubin Urine NEGATIVE  NEGATIVE     Ketones, ur NEGATIVE  NEGATIVE (mg/dL)    Protein, ur NEGATIVE  NEGATIVE (mg/dL)    Urobilinogen, UA 0.2  0.0 - 1.0 (mg/dL)    Nitrite NEGATIVE  NEGATIVE     Leukocytes, UA  NEGATIVE  NEGATIVE    URINE MICROSCOPIC-ADD ON     Status: Normal   Collection Time   10/18/11  3:18 AM      Component Value Range Comment   Squamous Epithelial / LPF RARE  RARE     WBC, UA 0-2  <3 (WBC/hpf)    RBC / HPF 3-6  <3 (RBC/hpf)    Bacteria, UA RARE  RARE    GLUCOSE, CAPILLARY     Status: Normal   Collection Time   10/18/11  5:31 AM      Component Value Range Comment   Glucose-Capillary 87  70 - 99 (mg/dL)   GLUCOSE, CAPILLARY     Status: Normal   Collection Time   10/18/11  7:58 AM      Component Value Range Comment   Glucose-Capillary 88  70 - 99 (mg/dL)    Dg Chest 2 View  01/15/8468  *RADIOLOGY REPORT*  Clinical Data: Palpitations  CHEST - 2 VIEW  Comparison: 05/03/2011 IMPRESSION: No acute process identified.  Original Report Authenticated By: Waneta Martins, M.D.   Ct Angio Chest W/cm &/or Wo Cm  10/18/2011  *RADIOLOGY REPORT*  Clinical Data: Chest pain  CT ANGIOGRAPHY CHEST IMPRESSION: No main or lobar branch pulmonary arterial filling defect. Suboptimal evaluation of the more peripheral branches.  Lobular anterior mediastinal mass measures 3.1 cm.  Differential includes thymic masses, lymphoma, and germ cell tumor.  Tissue sampling should be considered.  Original Report Authenticated By: Waneta Martins, M.D.   ROS  Per HPI  Blood pressure 130/76, pulse 84, temperature 98.3 F (36.8 C), temperature source Oral, resp. rate 20, height 5' 2.5" (1.588 m), weight 229 lb 4.5 oz (104 kg), SpO2 96.00%.  Physical Exam  Constitutional: No distress.  HENT:  Head: Normocephalic and atraumatic.       MM dry  Eyes: Conjunctivae are normal. Pupils are equal, round, and reactive to light.  Neck: Normal range of motion. Neck supple. No JVD present.  Cardiovascular: Normal rate and normal heart sounds.        4/6 SEM  Respiratory: Effort normal and breath sounds normal. She has no wheezes. She has no rales.  GI: Soft. Bowel sounds are normal. She exhibits no distension.  There is no tenderness.  Musculoskeletal:       Trace non-pitting pedal edema  Neurological: She is alert.  Skin: Skin is warm.     Assessment/Plan 66 y/o female with history of palpitations, HTN, DM, found to be hyponatremic:  1) Hyponatremia: likely hypovolemic hyponatremia but concern for SIADH in setting of mediastinal mass found on CTA. - Hydrate with NS @ 75 cc/hour - Calculate FeNa - Check serum and urine osm - Hold Lisinopril-HCTZ - Recheck  BMET at noon  2) DM 2: - Check A1c - SSI  3) HTN: BP well controlled - Will hold Lisinopril-HCTZ  4) Palpitations:  - resolved - Continue to monitor - Will need follow up with outpatient Cardiology  5) Mediastinal mass: no significant weight loss, rashes, fevers, or abnormal CBC to suggest malignancy - Will discuss work up with primary team - Likely defer to PCP for further monitoring  6) PPX: Lovenox SQ  7) FEN/GI: carb-modified diet and NS @ 75 cc/hr.  Consult Nutrition and DM educator.  8) Dispo: pending clinical improvement   DE LA CRUZ,IVY 10/18/2011, 8:37 AM

## 2011-10-18 NOTE — Progress Notes (Signed)
Pt arrived to the unit with admitting dx.palpitations that have resolved prior to arriving to the unit. Pt is alert and oriented x3. She is ambulatory with stand by assist. No skin break down noted. Pt is continent of bowel and bladder. Last BM 2/7. Pt is from home with her daughter. She denies any pain. VS stable. No distress noted. Pt oriented to staff and unit. Will cont to monitor.

## 2011-10-18 NOTE — Progress Notes (Signed)
Patient admitted with hyponatremia & palpitations.  History of DM2.  Takes Glipizide & Metformin at home.  Currently on Novolog Moderate SSI coverage while in hospital.  Patient had concerns about the sores on her legs.  May have bumped her left LE on something.  Was supposed to be taking an oral antibiotic and using a cream on her sores.  Will patient benefit from a wound care consult?  Patient states she checks her CBGs daily at home.  Have encouraged her to continue this practice.  Explained why we have placed patient on SSI while in the hospital.  Patient told me she does not want to take insulin at home.  Reminded patient that insulin is usually started on all patients with Diabetes in the hospital.  Patient agreeable to insulin while in the hospital.  Also told patient that we have drawn an A1C to check her home glucose control.  Patient stated she knows that her goal is 7% or less.    Will follow. Ambrose Finland RN, MSN, CDE Diabetes Coordinator Inpatient Diabetes Program [phone]

## 2011-10-18 NOTE — ED Notes (Signed)
CBG CHECKED BY EMT R Dionisia [id number] 

## 2011-10-18 NOTE — H&P (Signed)
[facility] Attending Admit Note  Patient seen and examined by me, discussed with Dr. Tye Savoy and rest of resident team and I agree with assessment and plan. Patient is a 65yoF who presented to ED with complaint of palpitations and malaise, was found to be hyponatremic and tachycardic; had incidental finding of mediastinal mass on CTA chest which was initially done to exclude PE (negative for PE).  The patient received 500cc IV bolus in the ED.  By my exam the patient does not appear to be volume-depleted, in no apparent distress.  She has moist mucus membranes and no tenting on skin exam.  She does not have peripheral edema or rales on her lung exam.    Assess/Plan: 65yoF admitted with mild hyponatremia and new finding of mediastinal mass on CT scan.  Possible causes include SIADH from intrathoracic mass; polydipsia (patient reports drinking large amounts of water); additional salt wasting from thiazide, which we will hold. Reassessment of lytes later today; gentle hydration with NS solution.  To develop a plan for accessing tissue from mediastinal mass; to speak with cardiothoracic surgery regarding this.  Paula Compton, MD

## 2011-10-18 NOTE — ED Notes (Signed)
PT. REPORTS PALPITATIONS AND SLIGHT CHEST DISCOMFORT ONSET THIS EVENING , NO SOB  , OCCASIONAL DRY COUGH , DENIES NAUSEA OR DIAPHORESIS.

## 2011-10-18 NOTE — ED Provider Notes (Signed)
History     CSN: [id number]  Arrival date & time 10/18/11  0140   First MD Initiated Contact with Patient 10/18/11 [phone]      Chief Complaint  Patient presents with  . Palpitations    (Consider location/radiation/quality/duration/timing/severity/associated sxs/prior treatment) HPI Patient is a 89 ear old female who presents this evening complaining of palpitations. She was sitting still around 8:00 when she developed this. She denies any chest pain with this. Initial heart rate is between 101 10. Patient denies any shortness of breath with this. She's not had any nausea or vomiting. Patient has no history of coronary artery disease, DVT, or pulmonary embolus. Patient denies any variation in her symptoms with deep breaths. She does not have any history of recent coughs or fevers. There are no other associated or modifying factors. Past Medical History  Diagnosis Date  . HYPERTENSION, BENIGN ESSENTIAL   . HEMATURIA, MICROSCOPIC   . DM   . DM W/NEURO MNFST, TYPE II, UNCONTROLLED   . Palpitations     Past Surgical History  Procedure Date  . Kelman phacoemulsification cataract     No family history on file.  History  Substance Use Topics  . Smoking status: Former Games developer  . Smokeless tobacco: Not on file  . Alcohol Use: Not on file    OB History    Grav Para Term Preterm Abortions TAB SAB Ect Mult Living                  Review of Systems  Constitutional: Negative.   HENT: Negative.   Eyes: Negative.   Respiratory: Negative.   Cardiovascular: Positive for palpitations.  Gastrointestinal: Negative.   Genitourinary: Negative.   Musculoskeletal: Negative.   Skin: Negative.   Neurological: Negative.   Hematological: Negative.   Psychiatric/Behavioral: Negative.   All other systems reviewed and are negative.    Allergies  Review of patient's allergies indicates no known allergies.  Home Medications   Current Outpatient Rx  Name Route Sig Dispense Refill  .  ACETAMINOPHEN 500 MG PO TABS Oral Take 500 mg by mouth every 6 (six) hours as needed. For pain    . ASPIRIN 81 MG PO TABS Oral Take 81 mg by mouth daily.      Marland Kitchen GLIPIZIDE 10 MG PO TABS Oral Take 10 mg by mouth daily.      Marland Kitchen LISINOPRIL-HYDROCHLOROTHIAZIDE 20-25 MG PO TABS Oral Take 1 tablet by mouth daily.      Marland Kitchen LORATADINE 10 MG PO TABS Oral Take 10 mg by mouth daily.      Marland Kitchen METFORMIN HCL 1000 MG PO TABS Oral Take 1,000 mg by mouth 2 (two) times daily with a meal.      . ROSUVASTATIN CALCIUM 10 MG PO TABS Oral Take 10 mg by mouth daily.    . SULFAMETHOXAZOLE-TRIMETHOPRIM 800-160 MG PO TABS Oral Take 1 tablet by mouth 2 (two) times daily. For infection on left leg started 10/11/2011    . TRIAMCINOLONE ACETONIDE 0.1 % EX CREA Topical Apply 1 application topically 2 (two) times daily.      BP 134/58  Pulse 102  Temp(Src) 98.3 F (36.8 C) (Oral)  Resp 15  SpO2 100%  Physical Exam  Nursing note and vitals reviewed. GEN: Well-developed, well-nourished female in no distress HEENT: Atraumatic, normocephalic. Oropharynx clear without erythema EYES: PERRLA BL, no scleral icterus. NECK: Trachea midline, no meningismus CV: Tachycardic with regular  rhythm. No murmurs, rubs, or gallops. No peripheral edema noted  PULM: No respiratory distress.  No crackles, wheezes, or rales. GI: soft, non-tender. No guarding, rebound, or tenderness. + bowel sounds  Neuro: cranial nerves 2-12 intact, no abnormalities of strength or sensation, A and O x 3 MSK: Patient moves all 4 extremities symmetrically, no deformity, edema, or injury noted Psych: no abnormality of mood  ED Course  Procedures (including critical care time)   Date: 10/18/2011  Rate: 109  Rhythm: sinus tachycardia  QRS Axis: normal  Intervals: normal  ST/T Wave abnormalities: normal  Conduction Disutrbances:none  Narrative Interpretation:   Old EKG Reviewed: none available  Labs Reviewed  CBC - Abnormal; Notable for the following:     HCT 34.8 (*)    All other components within normal limits  BASIC METABOLIC PANEL - Abnormal; Notable for the following:    Sodium 123 (*)    Chloride 88 (*)    Glucose, Bld 152 (*)    BUN 24 (*)    GFR calc non Af Amer 53 (*)    GFR calc Af Amer 62 (*)    All other components within normal limits  URINALYSIS, ROUTINE W REFLEX MICROSCOPIC - Abnormal; Notable for the following:    Hgb urine dipstick MODERATE (*)    All other components within normal limits  DIFFERENTIAL  POCT I-STAT TROPONIN I  URINE MICROSCOPIC-ADD ON  GLUCOSE, CAPILLARY   Dg Chest 2 View  10/18/2011  *RADIOLOGY REPORT*  Clinical Data: Palpitations  CHEST - 2 VIEW  Comparison: 05/03/2011  Findings: Mild hypoaeration, results in interstitial crowding.  No focal consolidation.  No pleural effusion or pneumothorax. Cardiomediastinal contours within normal limits.  Slight right hilar prominence is unchanged. Multilevel degenerative changes.  No acute osseous abnormality.  IMPRESSION: No acute process identified.  Original Report Authenticated By: Waneta Martins, M.D.   Ct Angio Chest W/cm &/or Wo Cm  10/18/2011  *RADIOLOGY REPORT*  Clinical Data: Chest pain  CT ANGIOGRAPHY CHEST  Technique:  Multidetector CT imaging of the chest using the standard protocol during bolus administration of intravenous contrast. Multiplanar reconstructed images including MIPs were obtained and reviewed to evaluate the vascular anatomy.  Contrast:  100 ml Omnipaque-300  Comparison: 06/16/2012 radiograph  Findings: Suboptimal contrast bolus timing and streak artifact secondary to patient body habitus limits evaluation of the peripheral branches.  No main or lobar branch pulmonary arterial filling defect identified.  Normal caliber aorta with scattered atherosclerotic calcification.  Lobular anterior mediastinal mass, measures 3.1 cm.  No internal fat or calcification identified.  No invasion of the adjacent structures however it does abut the costal  cartilage of the left second rib.  Normal heart size.  Coronary artery calcification.  No pleural or pericardial effusion.  No intrathoracic lymphadenopathy elsewhere. Enlarged, lobular thyroid gland.  Limited images through the upper abdomen show no acute abnormality.  Central airways are patent.  No focal consolidation.  No pneumothorax. No suspicious nodularity.  Multilevel degenerative changes of the imaged spine.  The marrow is somewhat heterogeneous however a focal lesion is not definitively identified.  No acute fracture.  IMPRESSION: No main or lobar branch pulmonary arterial filling defect. Suboptimal evaluation of the more peripheral branches.  Lobular anterior mediastinal mass measures 3.1 cm.  Differential includes thymic masses, lymphoma, and germ cell tumor.  Tissue sampling should be considered.  Original Report Authenticated By: Waneta Martins, M.D.     1. Hyponatremia   2. Mediastinal mass       MDM Patient was evaluated by myself for  her palpitations. Patient did not have any chest pain however she was a diabetic in her 63s with this sensation. Sinus tachycardia was shown on EKG with no acute ischemic changes. Patient was given an aspirin or main her workup is completed. CBC was unremarkable with no signs of anemia or leukocytosis. Patient did have a new hyponatremia with sodium of 123. Chest x-ray was unremarkable. Given that patient had a sinus tachycardia in conjunction with hyponatremia I did perform CT and geode to ensure that she did not have a PE. No pulmonary embolus was noted the patient does have an anterior mediastinal mass which is new today. Patient did receive 1 L normal saline IV bolus and her sinus tachycardia resolved. However, in light of her hyponatremia as well as this new finding of mass patient will be admitted to family medicine for further management.          Cyndra Numbers, MD 10/18/11 [phone]

## 2011-10-18 NOTE — ED Notes (Addendum)
Pt stated that she felt like hear was racing earlier this evening. Currently HR was 90 manual. No chest pain, SOB, N/V, or dizziness. Pt also stated that abx were making he stomach upset. Pt is on abx because of left leg wound. No respiratory distress. Will continue to monitor.

## 2011-10-18 NOTE — Progress Notes (Signed)
CARDIOTHORACIC SURGERY CONSULTATION REPORT  PCP is No primary provider on file. Attending physician is Barbaraann Barthel, MD Referring Provider is Edd Arbour, MD   Reason for consultation:  Anterior mediastinal mass  HPI:  Patient is a 66 year old morbidly obese African American female followed at [facility] with history of hypertension and type 2 diabetes mellitus. The patient reportedly also has history of palpitations in the past. She presented to [facility] emergency department with palpitations earlier today. Patient denies any associated chest discomfort or shortness of breath. EKG performed in the emergency room Reveals sinus tachycardia without acute ischemic changes. The patient apparently has had some premature ventricular contractions. Cardiac enzymes were negative. The patient underwent CT angiogram of the chest to rule out pulmonary embolus. There was no sign of pulmonary embolus but a small anterior mediastinal mass was identified. Cardiothoracic surgical consultation has been requested.  Past Medical History  Diagnosis Date  . HYPERTENSION, BENIGN ESSENTIAL   . HEMATURIA, MICROSCOPIC   . DM   . DM W/NEURO MNFST, TYPE II, UNCONTROLLED   . Palpitations   . Dysrhythmia     " heart Palpatations "  . Lung mass     Past Surgical History  Procedure Date  . Kelman phacoemulsification cataract     History reviewed. No pertinent family history.  Social History History  Substance Use Topics  . Smoking status: Former Smoker    Quit date: 09/16/2010  . Smokeless tobacco: Never Used  . Alcohol Use: Yes     social    Prior to Admission medications   Medication Sig Start Date End Date Taking? Authorizing Provider  acetaminophen (TYLENOL) 500 MG tablet Take 500 mg by mouth every 6 (six) hours as needed. For pain   Yes Historical Provider, MD  aspirin 81 MG tablet Take 81 mg by mouth daily.     Yes Historical Provider, MD  glipiZIDE (GLUCOTROL) 10 MG  tablet Take 10 mg by mouth daily.     Yes Historical Provider, MD  lisinopril-hydrochlorothiazide (PRINZIDE,ZESTORETIC) 20-25 MG per tablet Take 1 tablet by mouth daily.     Yes Historical Provider, MD  loratadine (CLARITIN) 10 MG tablet Take 10 mg by mouth daily.     Yes Historical Provider, MD  metFORMIN (GLUCOPHAGE) 1000 MG tablet Take 1,000 mg by mouth 2 (two) times daily with a meal.     Yes Historical Provider, MD  rosuvastatin (CRESTOR) 10 MG tablet Take 10 mg by mouth daily.   Yes Historical Provider, MD  sulfamethoxazole-trimethoprim (BACTRIM DS,SEPTRA DS) 800-160 MG per tablet Take 1 tablet by mouth 2 (two) times daily. For infection on left leg started 10/11/2011   Yes Historical Provider, MD  triamcinolone cream (KENALOG) 0.1 % Apply 1 application topically 2 (two) times daily.   Yes Historical Provider, MD    Current Facility-Administered Medications  Medication Dose Route Frequency Provider Last Rate Last Dose  . 0.9 %  sodium chloride infusion   Intravenous Continuous Edd Arbour, MD 125 mL/hr at 10/18/11 1926 125 mL/hr at 10/18/11 1926  . acetaminophen (TYLENOL) tablet 650 mg  650 mg Oral Q6H PRN Ivy Tye Savoy, MD       Or  . acetaminophen (TYLENOL) suppository 650 mg  650 mg Rectal Q6H PRN Ivy Tye Savoy, MD      . aspirin chewable tablet 324 mg  324 mg Oral Once Cyndra Numbers, MD   324 mg at 10/18/11 0252  . aspirin  EC tablet 81 mg  81 mg Oral Daily Christella Hartigan, PHARMD   81 mg at 10/18/11 1042  . heparin injection 5,000 Units  5,000 Units Subcutaneous Q8H Ivy Tye Savoy, MD   5,000 Units at 10/18/11 1633  . insulin aspart (novoLOG) injection 0-15 Units  0-15 Units Subcutaneous TID WC Ivy de Lawson Radar, MD      . iohexol (OMNIPAQUE) 300 MG/ML solution 100 mL  100 mL Intravenous Once PRN Medication Radiologist, MD   100 mL at 10/18/11 0428  . loratadine (CLARITIN) tablet 10 mg  10 mg Oral Daily Barnabas Lister, MD   10 mg at 10/18/11 1043  . rosuvastatin (CRESTOR) tablet 10 mg  10  mg Oral Daily Ivy de Lawson Radar, MD      . sodium chloride 0.9 % bolus 500 mL  500 mL Intravenous Once Cyndra Numbers, MD   500 mL at 10/18/11 0304  . triamcinolone cream (KENALOG) 0.1 % 1 application  1 application Topical BID Barnabas Lister, MD   1 application at 10/18/11 1044  . DISCONTD: acetaminophen (TYLENOL) tablet 500 mg  500 mg Oral Q6H PRN Ivy Tye Savoy, MD      . DISCONTD: aspirin tablet 81 mg  81 mg Oral Daily Ivy de Lawson Radar, MD        No Known Allergies  Review of Systems:  General:  normal appetite, normal energy   Respiratory:  no cough, no wheezing, no hemoptysis, no pain with inspiration or cough, no shortness of breath   Cardiac:   no chest pain or tightness, + exertional SOB, no resting SOB, no PND, no orthopnea, + LE edema, + palpitations, no syncope  GI:   no difficulty swallowing, no hematochezia, no hematemesis, no melena, no constipation, no diarrhea   GU:   no dysuria, no urgency, no frequency   Musculoskeletal: + arthralgia right knee, + recent skin infection left lower leg, doesn't ambulate much at all but can walk without assistance  Vascular:  no pain suggestive of claudication   Neuro:   no symptoms suggestive of TIA's, no seizures, no headaches, + peripheral neuropathy   Endocrine:  Type II DM, checks CBG's at home and claims to be in good control  HEENT:   no recent vision changes  Psych:   no anxiety, no depression    Physical Exam:   BP 118/73  Pulse 80  Temp(Src) 98.2 F (36.8 C) (Oral)  Resp 20  Ht 5' 2.5" (1.588 m)  Wt 104 kg (229 lb 4.5 oz)  BMI 41.27 kg/m2  SpO2 98%  General:  Morbidly obese female in no distress  HEENT:  Poor dentition  Neck:   no JVD, no bruits, no adenopathy   Chest:   clear to auscultation, symmetrical breath sounds, no wheezes, no rhonchi   CV:   RRR, no murmur   Abdomen:  soft, non-tender, no masses   Extremities:  warm, well-perfused, pulses not palpable, no LE edema, chronic skin changes both LE's with skin breakdown  left anterior leg  Rectal/GU  Deferred  Neuro:   Grossly non-focal and symmetrical throughout  Skin:   Clean and dry, no rashes, no breakdown  Diagnostic Tests:  CT angiogram of the chest performed earlier today is reviewed. This demonstrates a lobulated 3 cm anterior mediastinal mass that has benign appearing characteristics and is most consistent with benign thymoma. There is no radiographic sign of direct invasion into associated structures nearby.  There is no internal fat nor calcification within the mass. There is no associated lymphadenopathy. There no pulmonary parenchymal lesions.  There was no pulmonary embolism.   Impression:  3 cm anterior mediastinal mass with radiographic characteristics on CT scan most consistent with benign thymoma. This patient presents with palpitations and was noted to have hyponatremia on routine laboratory studies. There are no other abnormalities on the chest CT scan suspicious for the presence of an underlying malignant process.  This patient is morbidly obese and suffers from long-standing hypertension and type 2 diabetes mellitus. Risks of any type of surgical intervention would be increased because of her comorbid medical problems, but I suspect she would do fine with elective surgery for resection of anterior mediastinal mass once her electrolyte abnormalities have been eliminated. If surgery is to be planned I do think she should have formal cardiology consultation for preoperative clearance. Alternatively, if the patient is reluctant to consider surgical resection of her anterior mediastinal mass, we could obtain PET CT scan to make certain the mass is not hypermetabolic and/or proceed with transthoracic needle biopsy for tissue diagnosis. Finally, simple repeat follow up CT scan in 3 months could be performed as a least aggressive approach to further management of the mass.   I would favor surgical resection as the most definitive means to establish  diagnosis and definitively treat this mass.   Plan:   For completeness sake we will order quantitative beta hCG and alpha-fetoprotein. We will continue to follow and discuss options further with Ms. Mehlman. At this point she seems reluctant to consider elective surgery. If she desires to proceed with surgery she would need cardiology consultation for preoperative clearance. Alternatively, once her hyponatremia has been sorted out and resolved we could follow her as an outpatient and schedule PET CT scan as an outpatient. All of her questions been addressed.   Salvatore Decent. Cornelius Moras, MD 10/18/2011 7:39 PM

## 2011-10-18 NOTE — ED Notes (Signed)
Gave Report to Gaylene Brooks. No questions or concerns.

## 2011-10-18 NOTE — Progress Notes (Signed)
12:41 PM 10/18/2011 Samantha Arbour MD  Patient seen and examined.  She is asymptomatic at this time.  No complaints, does not say she has palpitations at this time.  Filed Vitals:   10/18/11 0615 10/18/11 0630 10/18/11 0657 10/18/11 0801  BP: 135/65 138/71 138/71 130/76  Pulse: 98 102 96 84  Temp:    98.3 F (36.8 C)  TempSrc:    Oral  Resp:  19 22 20   Height:    5' 2.5" (1.588 m)  Weight:    229 lb 4.5 oz (104 kg)  SpO2: 98% 94% 97% 96%  Lungs:  Normal respiratory effort, chest expands symmetrically. Lungs are clear to auscultation, no crackles or wheezes. Heart - Regular rate and rhythm.  No murmurs, gallops or rubs.    Extremities:  No cyanosis, edema, or deformity noted. Abdomen: soft and non-tender without masses, organomegaly or hernias noted.  No guarding or rebound  A/P Patient is a 66 y/o aaf with palpitations and negative EKG as well as hyponatremia.  1. Palpitaitons: EKG normal sinus.  Vital signs normal.  troponins negative.  No telemetry changes. PVC's vs. psvt  2.  Hyponatremia Patient has corrected hypoosmolar serum level of 245 -> urine osmol >100 -> will exclude hypothyroisism and adrenal insufficiency with TSH and Cortisol -> urine sodium 32 -> hypovolemia vs. SIADH vs. Osmol reset syndrome - will follow change in sodium after 2 liters of NS 0.9% IVF.  3. Anterior thoracic mass - Terrible T's - thymoma, teratoma, thyroid, lymphoma - will discuss planning with CTS.  Samantha Arbour MD 12:49 PM

## 2011-10-19 DIAGNOSIS — J9859 Other diseases of mediastinum, not elsewhere classified: Secondary | ICD-10-CM

## 2011-10-19 DIAGNOSIS — E871 Hypo-osmolality and hyponatremia: Secondary | ICD-10-CM

## 2011-10-19 LAB — COMPREHENSIVE METABOLIC PANEL
AST: 22 U/L (ref 0–37)
Albumin: 3.4 g/dL — ABNORMAL LOW (ref 3.5–5.2)
Calcium: 9.5 mg/dL (ref 8.4–10.5)
Chloride: 107 mEq/L (ref 96–112)
Creatinine, Ser: 0.92 mg/dL (ref 0.50–1.10)
Total Bilirubin: 0.2 mg/dL — ABNORMAL LOW (ref 0.3–1.2)
Total Protein: 6.7 g/dL (ref 6.0–8.3)

## 2011-10-19 LAB — CORTISOL: Cortisol, Plasma: 13.9 ug/dL

## 2011-10-19 LAB — HCG, QUANTITATIVE, PREGNANCY: hCG, Beta Chain, Quant, S: 1 m[IU]/mL (ref <5)

## 2011-10-19 LAB — GLUCOSE, CAPILLARY
Glucose-Capillary: 87 mg/dL (ref 70–99)
Glucose-Capillary: 97 mg/dL (ref 70–99)

## 2011-10-19 NOTE — Discharge Summary (Signed)
Physician Discharge Summary  Patient ID: Samantha [name] MRN: [id number] DOB: Jul 22, 1946 Age: 66 y.o.  Admit date: 10/18/2011 Discharge date: 10/19/2011  PCP: [facility]  Consultants:Clarence Cornelius Moras, MD. [facility].   Discharge Diagnosis: Active Problems:  Palpitations (resolved)  Mediastinal mass  Hyponatremia (resolved) Secondary Problems  HTN  Type 2 DM  History of Tobacco Use   Hospital Course 66 yo AAF followed at [facility] with history of hypertension and type 2 diabetes mellitus who presents to ED with palpitations, but no dyspnea or CP. Was found to be hyponatremic which resolved with fluids and was also had CT to rule out PE and incidental mediastinal mass was found.  1. Palpitations: Had palpitations on admission., but denied SOB, CP or dyspnea. EKG showed sinus tachycardia without acute ischemic changes, troponins were negative x 1 and there was no telemetry changes. Also had a CT to rule out PE and a mediastinal mass was found. She was given fluids and her palpitations resolved.   2. Hyponatremia: Patient had serum sodium of 123 on admission with corrected hypoosmolar serum level of 250 and urine osmolality over 100. Differential at that point included hypothyroidism, adrenal insufficiency, hypovolemia or SIADH. TSH and Cortisol were normal. Urine sodium was 32. Patient was given NS at a rate of 2 L per day. On HD1 her serum sodium had risen to 132 and at discharge sodium was 140 indicating hypovolemia as the likely etiology of her initial hyponatremia.   3. Anterior thoracic mass (3.1 cm): Incidental finding on CT. Concerns are for thymoma, teratoma, thyroid cancer or lymphoma. BHcg and a-fetoprotein were normal. CT surgery was consulted and would prefer complete excision for diagnosis as next step. Patient was not amenable to surgery at this time, but further conversation would be advised. Other options per CT surgery would be need biopsy to attempt  diagnosis, PET scan to check for hypermetabolic state or most conservative repeat CT in three months to reevaluate for change. Patient will follow-up with CT surgery later this month. CT surgery desires cardiology evaluation for patient if patient is to go to surgery.  4. Type 2 DM: HbA1c was 6.6. Patient was placed on SSI. Was noted to have skin changes on lower left leg. Appeared well-demarcated, shiny and atrophic. No erythema, itching or swelling. 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 Patient was initially on Bactrim and Triamcinolone for this. Bactrim was stopped as no sign of infection at this time. Triamcinolone was continued.   Procedures/Imaging:  Dg Chest 2 View  10/18/2011  *RADIOLOGY REPORT*  Clinical Data: Palpitations  CHEST - 2 VIEW  Comparison: 05/03/2011  Findings: Mild hypoaeration, results in interstitial crowding.  No focal consolidation.  No pleural effusion or pneumothorax. Cardiomediastinal contours within normal limits.  Slight right hilar prominence is unchanged. Multilevel degenerative changes.  No acute osseous abnormality.  IMPRESSION: No acute process identified.  Original Report Authenticated By: Waneta Martins, M.D.   Ct Angio Chest W/cm &/or Wo Cm  10/18/2011  *RADIOLOGY REPORT*  Clinical Data: Chest pain  CT ANGIOGRAPHY CHEST  Technique:  Multidetector CT imaging of the chest using the standard protocol during bolus administration of intravenous contrast. Multiplanar reconstructed images including MIPs were obtained and reviewed to evaluate the vascular anatomy.  Contrast:  100 ml Omnipaque-300  Comparison: 06/16/2012 radiograph  Findings: Suboptimal contrast bolus timing and streak artifact secondary to patient body habitus limits evaluation of the peripheral branches.  No main or lobar branch pulmonary arterial filling defect identified.  Normal caliber aorta with scattered atherosclerotic calcification.  Lobular anterior mediastinal mass,  measures 3.1 cm.  No internal fat or calcification identified.  No invasion of the adjacent structures however it does abut the costal cartilage of the left second rib.  Normal heart size.  Coronary artery calcification.  No pleural or pericardial effusion.  No intrathoracic lymphadenopathy elsewhere. Enlarged, lobular thyroid gland.  Limited images through the upper abdomen show no acute abnormality.  Central airways are patent.  No focal consolidation.  No pneumothorax. No suspicious nodularity.  Multilevel degenerative changes of the imaged spine.  The marrow is somewhat heterogeneous however a focal lesion is not definitively identified.  No acute fracture.  IMPRESSION: No main or lobar branch pulmonary arterial filling defect. Suboptimal evaluation of the more peripheral branches.  Lobular anterior mediastinal mass measures 3.1 cm.  Differential includes thymic masses, lymphoma, and germ cell tumor.  Tissue sampling should be considered.  Original Report Authenticated By: Waneta Martins, M.D.    Labs  CBC  Lab 10/18/11 0152  WBC 7.0  HGB 12.1  HCT 34.8*  PLT 305   BMET  Lab 10/19/11 0620 10/18/11 1215 10/18/11 0845 10/18/11 0152  NA 140 131* -- 123*  K 4.8 4.8 -- 4.9  CL 107 96 -- 88*  CO2 24 25 -- 23  BUN 16 19 -- 24*  CREATININE 0.92 1.03 1.02 --  CALCIUM 9.5 10.0 -- 10.2  PROT 6.7 -- -- --  BILITOT 0.2* -- -- --  ALKPHOS 59 -- -- --  ALT 15 -- -- --  AST 22 -- -- --  GLUCOSE 87 112* -- 152*   Results for orders placed during the hospital encounter of 10/18/11 (from the past 72 hour(s))  CBC     Status: Abnormal   Collection Time   10/18/11  1:52 AM      Component Value Range Comment   WBC 7.0  4.0 - 10.5 (K/uL)    RBC 4.02  3.87 - 5.11 (MIL/uL)    Hemoglobin 12.1  12.0 - 15.0 (g/dL)    HCT 82.9 (*) 56.2 - 46.0 (%)    MCV 86.6  78.0 - 100.0 (fL)    MCH 30.1  26.0 - 34.0 (pg)    MCHC 34.8  30.0 - 36.0 (g/dL)    RDW 13.0  86.5 - 78.4 (%)    Platelets 305  150 - 400  (K/uL)   DIFFERENTIAL     Status: Normal   Collection Time   10/18/11  1:52 AM      Component Value Range Comment   Neutrophils Relative 59  43 - 77 (%)    Neutro Abs 4.2  1.7 - 7.7 (K/uL)    Lymphocytes Relative 31  12 - 46 (%)    Lymphs Abs 2.2  0.7 - 4.0 (K/uL)    Monocytes Relative 9  3 - 12 (%)    Monocytes Absolute 0.6  0.1 - 1.0 (K/uL)    Eosinophils Relative 0  0 - 5 (%)    Eosinophils Absolute 0.0  0.0 - 0.7 (K/uL)    Basophils Relative 0  0 - 1 (%)    Basophils Absolute 0.0  0.0 - 0.1 (K/uL)   BASIC METABOLIC PANEL     Status: Abnormal   Collection Time   10/18/11  1:52 AM      Component Value Range Comment   Sodium 123 (*) 135 - 145 (mEq/L)    Potassium 4.9  3.5 - 5.1 (  mEq/L)    Chloride 88 (*) 96 - 112 (mEq/L)    CO2 23  19 - 32 (mEq/L)    Glucose, Bld 152 (*) 70 - 99 (mg/dL)    BUN 24 (*) 6 - 23 (mg/dL)    Creatinine, Ser 1.61  0.50 - 1.10 (mg/dL)    Calcium 09.6  8.4 - 10.5 (mg/dL)    GFR calc non Af Amer 53 (*) >90 (mL/min)    GFR calc Af Amer 62 (*) >90 (mL/min)   POCT I-STAT TROPONIN I     Status: Normal   Collection Time   10/18/11  2:10 AM      Component Value Range Comment   Troponin i, poc 0.00  0.00 - 0.08 (ng/mL)    Comment 3            URINALYSIS, ROUTINE W REFLEX MICROSCOPIC     Status: Abnormal   Collection Time   10/18/11  3:18 AM      Component Value Range Comment   Color, Urine YELLOW  YELLOW     APPearance CLEAR  CLEAR     Specific Gravity, Urine 1.006  1.005 - 1.030     pH 6.0  5.0 - 8.0     Glucose, UA NEGATIVE  NEGATIVE (mg/dL)    Hgb urine dipstick MODERATE (*) NEGATIVE     Bilirubin Urine NEGATIVE  NEGATIVE     Ketones, ur NEGATIVE  NEGATIVE (mg/dL)    Protein, ur NEGATIVE  NEGATIVE (mg/dL)    Urobilinogen, UA 0.2  0.0 - 1.0 (mg/dL)    Nitrite NEGATIVE  NEGATIVE     Leukocytes, UA NEGATIVE  NEGATIVE    URINE MICROSCOPIC-ADD ON     Status: Normal   Collection Time   10/18/11  3:18 AM      Component Value Range Comment   Squamous  Epithelial / LPF RARE  RARE     WBC, UA 0-2  <3 (WBC/hpf)    RBC / HPF 3-6  <3 (RBC/hpf)    Bacteria, UA RARE  RARE    GLUCOSE, CAPILLARY     Status: Normal   Collection Time   10/18/11  5:31 AM      Component Value Range Comment   Glucose-Capillary 87  70 - 99 (mg/dL)   GLUCOSE, CAPILLARY     Status: Normal   Collection Time   10/18/11  7:58 AM      Component Value Range Comment   Glucose-Capillary 88  70 - 99 (mg/dL)   HEMOGLOBIN E4V     Status: Abnormal   Collection Time   10/18/11  8:45 AM      Component Value Range Comment   Hemoglobin A1C 6.6 (*) <5.7 (%)    Mean Plasma Glucose 143 (*) <117 (mg/dL)   CREATININE, SERUM     Status: Abnormal   Collection Time   10/18/11  8:45 AM      Component Value Range Comment   Creatinine, Ser 1.02  0.50 - 1.10 (mg/dL)    GFR calc non Af Amer 57 (*) >90 (mL/min)    GFR calc Af Amer 66 (*) >90 (mL/min)   OSMOLALITY     Status: Normal   Collection Time   10/18/11  8:45 AM      Component Value Range Comment   Osmolality 275  275 - 300 (mOsm/kg)   TSH     Status: Normal   Collection Time   10/18/11  8:45 AM  Component Value Range Comment   TSH 1.247  0.350 - 4.500 (uIU/mL)   T4, FREE     Status: Normal   Collection Time   10/18/11  8:45 AM      Component Value Range Comment   Free T4 1.10  0.80 - 1.80 (ng/dL)   OSMOLALITY, URINE     Status: Abnormal   Collection Time   10/18/11  9:52 AM      Component Value Range Comment   Osmolality, Ur 166 (*) 390 - 1090 (mOsm/kg)   SODIUM, URINE, RANDOM     Status: Normal   Collection Time   10/18/11  9:52 AM      Component Value Range Comment   Sodium, Ur 32     CREATININE, URINE, RANDOM     Status: Normal   Collection Time   10/18/11  9:52 AM      Component Value Range Comment   Creatinine, Urine 12.40     GLUCOSE, CAPILLARY     Status: Abnormal   Collection Time   10/18/11 11:45 AM      Component Value Range Comment   Glucose-Capillary 108 (*) 70 - 99 (mg/dL)   BASIC METABOLIC PANEL     Status:  Abnormal   Collection Time   10/18/11 12:15 PM      Component Value Range Comment   Sodium 131 (*) 135 - 145 (mEq/L)    Potassium 4.8  3.5 - 5.1 (mEq/L)    Chloride 96  96 - 112 (mEq/L)    CO2 25  19 - 32 (mEq/L)    Glucose, Bld 112 (*) 70 - 99 (mg/dL)    BUN 19  6 - 23 (mg/dL)    Creatinine, Ser 1.61  0.50 - 1.10 (mg/dL)    Calcium 09.6  8.4 - 10.5 (mg/dL)    GFR calc non Af Amer 56 (*) >90 (mL/min)    GFR calc Af Amer 65 (*) >90 (mL/min)   GLUCOSE, CAPILLARY     Status: Normal   Collection Time   10/18/11  4:32 PM      Component Value Range Comment   Glucose-Capillary 93  70 - 99 (mg/dL)   GLUCOSE, CAPILLARY     Status: Normal   Collection Time   10/18/11 10:07 PM      Component Value Range Comment   Glucose-Capillary 88  70 - 99 (mg/dL)    Comment 1 Notify RN      Comment 2 Documented in Chart     COMPREHENSIVE METABOLIC PANEL     Status: Abnormal   Collection Time   10/19/11  6:20 AM      Component Value Range Comment   Sodium 140  135 - 145 (mEq/L) DELTA CHECK NOTED   Potassium 4.8  3.5 - 5.1 (mEq/L)    Chloride 107  96 - 112 (mEq/L)    CO2 24  19 - 32 (mEq/L)    Glucose, Bld 87  70 - 99 (mg/dL)    BUN 16  6 - 23 (mg/dL)    Creatinine, Ser 0.45  0.50 - 1.10 (mg/dL)    Calcium 9.5  8.4 - 10.5 (mg/dL)    Total Protein 6.7  6.0 - 8.3 (g/dL)    Albumin 3.4 (*) 3.5 - 5.2 (g/dL)    AST 22  0 - 37 (U/L)    ALT 15  0 - 35 (U/L)    Alkaline Phosphatase 59  39 - 117 (U/L)    Total Bilirubin  0.2 (*) 0.3 - 1.2 (mg/dL)    GFR calc non Af Amer 64 (*) >90 (mL/min)    GFR calc Af Amer 75 (*) >90 (mL/min)   HCG, QUANTITATIVE, PREGNANCY     Status: Normal   Collection Time   10/19/11  6:20 AM      Component Value Range Comment   hCG, Beta Chain, Quant, S 1  <5 (mIU/mL)   GLUCOSE, CAPILLARY     Status: Normal   Collection Time   10/19/11  7:48 AM      Component Value Range Comment   Glucose-Capillary 87  70 - 99 (mg/dL)    Comment 1 Documented in Chart      Comment 2 Notify RN       Discharge Exam Filed Vitals:   10/19/11 0933  BP: 98/60  Pulse: 69  Temp: 97.9 F (36.6 C)  Resp: 18  General: No apparent distress. Sitting comfortably on bed. Cooperative. HEENT: Normocephalic, atraumatic. EOMI. Mouth mucosa moist. Poor dentition. No JVD. Pulm: Normal WOB. CTAB. CV: RRR. No murmur, rubs or gallops. Abdomen: Soft. NT. ND. No masses. No rebound or guarding.  Extremities: Warm. Skin changes left lower extremity - well demarcated, shiny, nonerythematous. Neuro: CN2-12 grossly intact. AOx3.    Patient condition at time of discharge/disposition: stable Disposition - home   Follow up issues: 1. Mediastinal mass - Should follow-up with PCP and CT surgery to have further conversation regarding management of this mass and potential steps for diagnosis.  2. Hyponatremia - May recheck BMP outpatient to ensure sodium within normal limits. 3. Diabetes - Follow-up for outpatient monitoring and improvement in skin changes on legs.  Discharge follow up:  Follow-up Information    Follow up with Edd Arbour, MD. Schedule an appointment as soon as possible for a visit in 1 week.   Contact information:   [address] [place] [address] [phone]       Follow up with Purcell Nails, MD on 11/05/2011. (12:15 pm)    Contact information:   [address] [place] [address] [phone]         Discharge Orders    Future Appointments: Provider: Department: Dept Phone: Center:   11/05/2011 12:30 PM Purcell Nails, MD [facility] [phone] [id number]      Discharge Medications   Beverly Milch, MS4 Family Practice Teaching Service 10/19/2011 10:21 AM  [facility] Attending Note  Patient seen and examined by me, discussed with resident team and agree with plans to discharge patient.  We are holding her HCTZ and recommend recheck of electrolytes at follow up appointment at [facility].  She is uncertain about her  decision regarding resection of the mediastinal mass; plans to follow up with Dr. Cornelius Moras to further discuss surgical resection vs. Serial CT follow up.  Paula Compton, MD

## 2011-10-19 NOTE — Progress Notes (Signed)
Subjective: Feels much better this morning. Denies any further palpitations. Plans to discharge with follow-up for mediastinal mass outpatient, but after conversation with sister and friend has decided that she would prefer to continue with original recommendation for complete excision by CT surgery while inpatient.  Objective: Vital signs in last 24 hours: Temp:  [97.7 F (36.5 C)-98.7 F (37.1 C)] 97.9 F (36.6 C) (02/08 0933) Pulse Rate:  [69-80] 69  (02/08 0933) Resp:  [18-20] 18  (02/08 0933) BP: (97-118)/(60-73) 98/60 mmHg (02/08 0933) SpO2:  [96 %-98 %] 96 % (02/08 0933) Weight:  [104.6 kg (230 lb 9.6 oz)] 104.6 kg (230 lb 9.6 oz) (02/07 2300) Weight change:  Last BM Date: 10/18/11  Intake/Output from previous day: 02/07 0701 - 02/08 0700 In: 3154.2 [P.O.:840; I.V.:2314.2] Out: -  Intake/Output this shift: Total I/O In: 240 [P.O.:240] Out: -   Physical Exam: General: No apparent distress. Sitting comfortably on bed. Cooperative.  HEENT: Normocephalic, atraumatic. EOMI. Mouth mucosa moist. Poor dentition. No JVD.  Pulm: Normal WOB. CTAB.  CV: RRR. No murmur, rubs or gallops.  Abdomen: Soft. NT. ND. No masses. No rebound or guarding.  Extremities: Warm. Skin changes left lower extremity - well demarcated, shiny, nonerythematous.  Neuro: CN2-12 grossly intact. AOx3.    Lab Results:  [facility] 10/18/11 0152  WBC 7.0  HGB 12.1  HCT 34.8*  PLT 305   BMET  Basename 10/19/11 0620 10/18/11 1215  NA 140 131*  K 4.8 4.8  CL 107 96  CO2 24 25  GLUCOSE 87 112*  BUN 16 19  CREATININE 0.92 1.03  CALCIUM 9.5 10.0    Studies/Results: Dg Chest 2 View  10/18/2011  *RADIOLOGY REPORT*  Clinical Data: Palpitations  CHEST - 2 VIEW  Comparison: 05/03/2011  Findings: Mild hypoaeration, results in interstitial crowding.  No focal consolidation.  No pleural effusion or pneumothorax. Cardiomediastinal contours within normal limits.  Slight right hilar prominence is unchanged.  Multilevel degenerative changes.  No acute osseous abnormality.  IMPRESSION: No acute process identified.  Original Report Authenticated By: Waneta Martins, M.D.   Ct Angio Chest W/cm &/or Wo Cm  10/18/2011  *RADIOLOGY REPORT*  Clinical Data: Chest pain  CT ANGIOGRAPHY CHEST  Technique:  Multidetector CT imaging of the chest using the standard protocol during bolus administration of intravenous contrast. Multiplanar reconstructed images including MIPs were obtained and reviewed to evaluate the vascular anatomy.  Contrast:  100 ml Omnipaque-300  Comparison: 06/16/2012 radiograph  Findings: Suboptimal contrast bolus timing and streak artifact secondary to patient body habitus limits evaluation of the peripheral branches.  No main or lobar branch pulmonary arterial filling defect identified.  Normal caliber aorta with scattered atherosclerotic calcification.  Lobular anterior mediastinal mass, measures 3.1 cm.  No internal fat or calcification identified.  No invasion of the adjacent structures however it does abut the costal cartilage of the left second rib.  Normal heart size.  Coronary artery calcification.  No pleural or pericardial effusion.  No intrathoracic lymphadenopathy elsewhere. Enlarged, lobular thyroid gland.  Limited images through the upper abdomen show no acute abnormality.  Central airways are patent.  No focal consolidation.  No pneumothorax. No suspicious nodularity.  Multilevel degenerative changes of the imaged spine.  The marrow is somewhat heterogeneous however a focal lesion is not definitively identified.  No acute fracture.  IMPRESSION: No main or lobar branch pulmonary arterial filling defect. Suboptimal evaluation of the more peripheral branches.  Lobular anterior mediastinal mass measures 3.1 cm.  Differential includes  thymic masses, lymphoma, and germ cell tumor.  Tissue sampling should be considered.  Original Report Authenticated By: Waneta Martins, M.D.    Medications: I  have reviewed the patient's current medications.  Assessment/Plan: 66 yo AAF followed at [facility] with history of hypertension and type 2 diabetes mellitus who presents to ED with palpitations, but no dyspnea or CP. Was found to be hyponatremic which resolved with. Also had CT to rule out PE and incidental mediastinal mass was found.  1. Palpitations: Had palpitations on admission, but denied SOB, CP or dyspnea. Given fluids and now resolved. - EKG unremarkable and negative cardiac enzymes - Had CT to rule out PE and a mediastinal mass was found. See below for management.  2. Hyponatremia: Patient had serum sodium of 123 on admission. - Corrected hypoosmolar serum level of 250 and urine osmolality over 100. Differential at that point included hypothyroidism, adrenal insufficiency, hypovolemia or SIADH.  - TSH and Cortisol were normal.  - Urine sodium was 32.  - Patient was given NS at a rate of 2 L per day. Sodium now 140. Fluids stopped. - Will continue to monitor.  3. Anterior thoracic mass (3.1 cm): Incidental finding on CT. Concerns are for thymoma, teratoma, thyroid cancer or lymphoma.  - BHcg and a-fetoprotein were normal.  - CT surgery was consulted thanks for assistance. Would prefer complete excision for diagnosis as next step. Patient was not amenable to surgery initially and was to be discharged today. At time of discharge noted conversation with sister and friend and decided she does wants surgery with complete excision. - Will plan for cardiac eval and discuss with CT surgery possibility of surgery while inpatient  - Discussed other options with patient per CT surgery: need biopsy to attempt diagnosis, PET scan to check for hypermetabolic state or most conservative repeat CT in three months to reevaluate for change.  4. Type 2 DM: HbA1c was 6.6.  - Patient was placed on SSI.  - Skin changes on lower left leg. Well-demarcated, shiny and atrophic. No erythema, itching  or swelling. 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 Home Bactrim was stopped as no sign of infection at this time. Triamcinolone was continued.  5. FEN/GI: Diabetic diet.  6. PPx: Heparin  7. Dispo: Pending cardiac eval and CT surgery.   LOS: 1 day   Beverly Milch, [facility] Family Practice Teaching Service 10/19/2011, 2:04 PM  PGY-2 UPDATE:  I spoke to patient after discussing patient's case with medical student.  Patient has not made a definitive decision about CT surgery.  Patient still had several questions about the risks and benefits of surgery despite her conversation with Dr. Cornelius Moras yesterday.  Patient is aware of her outpatient CT appointment scheduled for 11/05/11.  I told patient that it is very important that she brings a family member or friend to appointment with her.  Patient is agreeable to go home today.  From a medical standpoint, patient is medically stable for discharge with close follow up with CT surgery outpatient for further work up up mediastinal mass.  Patient will call daughter to pick up her now.  DE LA CRUZ,IVY 10/19/2011 2:40 PM

## 2011-11-05 ENCOUNTER — Encounter: Payer: Self-pay | Admitting: Thoracic Surgery (Cardiothoracic Vascular Surgery)

## 2011-11-05 ENCOUNTER — Ambulatory Visit (INDEPENDENT_AMBULATORY_CARE_PROVIDER_SITE_OTHER): Payer: Self-pay | Admitting: Thoracic Surgery (Cardiothoracic Vascular Surgery)

## 2011-11-05 ENCOUNTER — Telehealth: Payer: Self-pay | Admitting: Cardiovascular Disease

## 2011-11-05 VITALS — BP 145/80 | HR 97 | Resp 16 | Ht 62.5 in | Wt 230.0 lb

## 2011-11-05 DIAGNOSIS — J9859 Other diseases of mediastinum, not elsewhere classified: Secondary | ICD-10-CM

## 2011-11-05 DIAGNOSIS — R222 Localized swelling, mass and lump, trunk: Secondary | ICD-10-CM

## 2011-11-05 DIAGNOSIS — Z01818 Encounter for other preprocedural examination: Secondary | ICD-10-CM

## 2011-11-05 NOTE — Telephone Encounter (Signed)
Pt having Heart Surgery and they need clearance all notes are in EPIC

## 2011-11-05 NOTE — Telephone Encounter (Signed)
LEFT MESSAGE PT NEEDS LEXISCAN  2 DAY PROTCOL THIS WEEK  FOR PRE OP PER DR NISHAN  AND ALSO NEEDS F/U  THIS WEEK OR NEXT WITH DR Eden Emms .Zack Seal

## 2011-11-05 NOTE — Progress Notes (Signed)
[address]            [name] [address]          [phone]     CARDIOTHORACIC SURGERY OFFICE NOTE  PCP is No primary provider on file.   Chief Complaint  Patient presents with  . Follow-up    from i/p consult...mediastinal mass    HPI:  Patient is a 66 year old morbidly obese African American female with poorly controlled type 2 diabetes mellitus hypertension and recurrent palpitations who was recently hospitalized at [facility] for symptoms of palpitations. 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  I saw the patient in consultation on 10/19/2011.  Thyroid hormone levels were normal, as were quantitative beta HCG and serum alfa fetoprotein levels.  We discussed possible options for further evaluation and treatment, including surgical resection versus PET/CT scan with continued follow up if the lesion was not hypermetabolic on PET in an effort to avoid the risks of surgery.  A time the patient was not interested in considering surgery but wanted to think matters over.  She returns for further followup today.  Past Medical History  Diagnosis Date  . HYPERTENSION, BENIGN ESSENTIAL   . HEMATURIA, MICROSCOPIC   . DM   . DM W/NEURO MNFST, TYPE II, UNCONTROLLED   . Palpitations   . Dysrhythmia     " heart Palpatations "  . Mediastinal mass 10/18/2011    noted on CT angiogram of chest    Past Surgical History  Procedure Date  . Kelman phacoemulsification cataract     No family history on file.  Social History History  Substance Use Topics  . Smoking status: Former Smoker    Quit date: 09/16/2010  . Smokeless tobacco: Never Used  . Alcohol Use: Yes     social    Current Outpatient Prescriptions  Medication Sig Dispense Refill  . acetaminophen (TYLENOL) 500 MG tablet Take 500 mg by mouth every 6 (six)  hours as needed. For pain      . aspirin 81 MG tablet Take 81 mg by mouth daily.        Marland Kitchen glipiZIDE (GLUCOTROL) 10 MG tablet Take 10 mg by mouth daily.        Marland Kitchen loratadine (CLARITIN) 10 MG tablet Take 10 mg by mouth daily.        . metFORMIN (GLUCOPHAGE) 1000 MG tablet Take 1,000 mg by mouth 2 (two) times daily with a meal.        . rosuvastatin (CRESTOR) 10 MG tablet Take 10 mg by mouth daily.      Marland Kitchen triamcinolone cream (KENALOG) 0.1 % Apply 1 application topically 2 (two) times daily.        No Known Allergies  Review of Systems:  General:  normal appetite, normal energy   Respiratory:  no cough, no wheezing, no hemoptysis, no pain with inspiration or cough, + exertional shortness of breath   Cardiac:   no chest pain or tightness, + exertional SOB, no resting SOB, no PND, no orthopnea, no LE edema, + palpitations, no syncope  GI:   no difficulty swallowing, no hematochezia, no hematemesis, no melena, no constipation, no diarrhea   GU:   no dysuria, no urgency, no frequency   Musculoskeletal: no arthritis,  no arthralgia   Vascular:  no pain suggestive of claudication   Neuro:   no symptoms suggestive of TIA's, no seizures, no headaches, no peripheral neuropathy  Endocrine:  Reports difficulty with blood sugar control  HEENT:  no loose teeth or painful teeth,  no recent vision changes  Psych:   no anxiety, no depression    Physical Exam:   BP 145/80  Pulse 97  Resp 16  Ht 5' 2.5" (1.588 m)  Wt 230 lb (104.327 kg)  BMI 41.40 kg/m2  SpO2 97%  General:  Morbidly obese    HEENT:  Unremarkable   Neck:   no JVD, no bruits, no adenopathy   Chest:   clear to auscultation, symmetrical breath sounds, no wheezes, no rhonchi   CV:   RRR, no murmur   Abdomen:  soft, non-tender, no masses   Extremities:  warm, adequately perfused, pulses not palpable  Rectal/GU  Deferred  Neuro:   Grossly non-focal and symmetrical throughout  Skin:   Clean and dry, no rashes, no breakdown   Diagnostic  Tests:  n/a   Impression:  Patient has a 3 cm anterior mediastinal mass with 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 She has multiple other medical problems including hypertension and put controlled type 2 diabetes mellitus. She suffers from morbid obesity and somewhat limited physical mobility.  Plan:  We again reviewed possible options with regards to the recently discovered anterior mediastinal mass. The patient is now interested in considering elective surgical resection as the most definitive means to make certain this is not a malignant process in to avoid the need for long-term followup. Because of her age and comorbidities, I feel that she should be seen by cardiology for clearance prior to any elective surgery. She has seen Dr. Eden Emms in the past because of her history of palpitations and had an echocardiogram last fall that was normal. We will ask him to see her back briefly in consultation to consider whether or not preoperative stress test should be obtained prior to surgery requiring general anesthesia. We'll plan to see the patient back in 3 weeks for further followup.    Salvatore Decent. Cornelius Moras, MD 11/05/2011 1:17 PM

## 2011-11-08 NOTE — Telephone Encounter (Signed)
SEE APPT'S LEXISCAN SCHEDULED AND FOLLOW UP PER  DR NISHAN./CY

## 2011-11-12 ENCOUNTER — Ambulatory Visit (HOSPITAL_COMMUNITY): Payer: Self-pay | Attending: Internal Medicine | Admitting: Radiology

## 2011-11-12 DIAGNOSIS — Z01818 Encounter for other preprocedural examination: Secondary | ICD-10-CM

## 2011-11-12 DIAGNOSIS — R0989 Other specified symptoms and signs involving the circulatory and respiratory systems: Secondary | ICD-10-CM

## 2011-11-12 MED ORDER — TECHNETIUM TC 99M TETROFOSMIN IV KIT
33.0000 | PACK | Freq: Once | INTRAVENOUS | Status: AC | PRN
  Administered 2011-11-12: 33 via INTRAVENOUS

## 2011-11-14 ENCOUNTER — Ambulatory Visit (HOSPITAL_COMMUNITY): Payer: Self-pay | Attending: Cardiology | Admitting: Radiology

## 2011-11-14 DIAGNOSIS — I10 Essential (primary) hypertension: Secondary | ICD-10-CM

## 2011-11-14 DIAGNOSIS — Z87891 Personal history of nicotine dependence: Secondary | ICD-10-CM | POA: Insufficient documentation

## 2011-11-14 DIAGNOSIS — R0600 Dyspnea, unspecified: Secondary | ICD-10-CM

## 2011-11-14 DIAGNOSIS — R0602 Shortness of breath: Secondary | ICD-10-CM

## 2011-11-14 DIAGNOSIS — Z01810 Encounter for preprocedural cardiovascular examination: Secondary | ICD-10-CM | POA: Insufficient documentation

## 2011-11-14 DIAGNOSIS — E119 Type 2 diabetes mellitus without complications: Secondary | ICD-10-CM

## 2011-11-14 DIAGNOSIS — R079 Chest pain, unspecified: Secondary | ICD-10-CM

## 2011-11-14 DIAGNOSIS — R0989 Other specified symptoms and signs involving the circulatory and respiratory systems: Secondary | ICD-10-CM | POA: Insufficient documentation

## 2011-11-14 DIAGNOSIS — Z01818 Encounter for other preprocedural examination: Secondary | ICD-10-CM

## 2011-11-14 DIAGNOSIS — R0609 Other forms of dyspnea: Secondary | ICD-10-CM | POA: Insufficient documentation

## 2011-11-14 DIAGNOSIS — R002 Palpitations: Secondary | ICD-10-CM

## 2011-11-14 MED ORDER — TECHNETIUM TC 99M TETROFOSMIN IV KIT
30.0000 | PACK | Freq: Once | INTRAVENOUS | Status: AC | PRN
  Administered 2011-11-14: 30 via INTRAVENOUS

## 2011-11-14 MED ORDER — REGADENOSON 0.4 MG/5ML IV SOLN
0.4000 mg | Freq: Once | INTRAVENOUS | Status: AC
  Administered 2011-11-14: 0.4 mg via INTRAVENOUS

## 2011-11-14 NOTE — Progress Notes (Signed)
[facility] SITE 3 NUCLEAR MED [address] [place] [address] [phone]  Cardiology Nuclear Med Study  Samantha [name] is a 66 y.o. female [id number] 1946-04-11   Nuclear Med Background Indication for Stress Test:  Evaluation for Ischemia and Pending Surgical Clearance:3cm mediastinal mass- Dr. Molinda Bailiff History:  Echo-EF 55-60%, mild MR Cardiac Risk Factors: History of Smoking, Hypertension and NIDDM  Symptoms:  DOE and Palpitations   Nuclear Pre-Procedure Caffeine/Decaff Intake:  None NPO After: 7:00am   Lungs:  Clear IV 0.9% NS with Angio Cath:  22g  IV Site: R Antecubital  IV Started by:  Stanton Kidney, EMT-P  Chest Size (in):  40 Cup Size: D  Height: 5' 2.5" (1.588 m)  Weight:  226 lb (102.513 kg)  BMI:  Body mass index is 40.68 kg/(m^2). Tech Comments:  CBG= 105 @ 6:24 am, per patient.    Nuclear Med Study 1 or 2 day study: 1 day  Stress Test Type:  Lexiscan  Reading MD: Olga Millers, MD  Order Authorizing Provider:  Charlton Haws, MD  Resting Radionuclide: Technetium 64m Tetrofosmin  Resting Radionuclide Dose: 33.0 mCi on 11/12/11   Stress Radionuclide:  Technetium 24m Tetrofosmin  Stress Radionuclide Dose: 32.7 mCi on 11/14/11           Stress Protocol Rest HR: 78 Stress HR: 111  Rest BP: 139/73 Stress BP: 137/67  Exercise Time (min): n/a METS: n/a   Predicted Max HR: 156 bpm % Max HR: 71.15 bpm Rate Pressure Product: 95638   Dose of Adenosine (mg):  n/a Dose of Lexiscan: 0.4 mg  Dose of Atropine (mg): n/a Dose of Dobutamine: n/a mcg/kg/min (at max HR)  Stress Test Technologist: Bonnita Levan, RN  Nuclear Technologist:  Domenic Polite, CNMT     Rest Procedure:  Myocardial perfusion imaging was performed at rest 45 minutes following the intravenous administration of Technetium 66m Tetrofosmin. Rest ECG: NSR  Stress Procedure:  The patient received IV Lexiscan 0.4 mg over 15-seconds.  Technetium 28m Tetrofosmin injected at 30-seconds.   There were no significant changes with Lexiscan.  Quantitative spect images were obtained after a 45 minute delay. Stress ECG: No significant ST segment change suggestive of ischemia.  QPS Raw Data Images:  Acquisition technically good; normal left ventricular size. Stress Images:  There is decreased uptake in the apex. Rest Images:  There is decreased uptake in the apex. Subtraction (SDS):  No evidence of ischemia. Transient Ischemic Dilatation (Normal <1.22):  1.04 Lung/Heart Ratio (Normal <0.45):  0.31  Quantitative Gated Spect Images QGS EDV:  57 ml QGS ESV:  10 ml QGS cine images:  NL LV Function; NL Wall Motion QGS EF: 83%  Impression Exercise Capacity:  Lexiscan with no exercise. BP Response:  Normal blood pressure response. Clinical Symptoms:  No cardiac symptoms reported. ECG Impression:  No significant ST segment change suggestive of ischemia. Comparison with Prior Nuclear Study: No images to compare  Overall Impression:  Normal stress nuclear study with a small, mild fixed apical defect suggestive of thinning but no ischemia.  Olga Millers

## 2011-11-22 ENCOUNTER — Emergency Department (HOSPITAL_COMMUNITY)
Admission: EM | Admit: 2011-11-22 | Discharge: 2011-11-22 | Disposition: A | Discharge status: Home / Self Care | Payer: Self-pay | Source: Home / Self Care | Attending: Emergency Medicine | Admitting: Emergency Medicine

## 2011-11-22 ENCOUNTER — Telehealth (HOSPITAL_COMMUNITY): Payer: Self-pay | Admitting: *Deleted

## 2011-11-22 ENCOUNTER — Encounter (HOSPITAL_COMMUNITY): Payer: Self-pay | Admitting: Emergency Medicine

## 2011-11-22 DIAGNOSIS — K5641 Fecal impaction: Secondary | ICD-10-CM

## 2011-11-22 HISTORY — DX: Pure hypercholesterolemia, unspecified: E78.00

## 2011-11-22 MED ORDER — LACTULOSE 10 GM/15ML PO SOLN: 20.0000 g | Freq: Three times a day (TID) | ORAL | Status: DC

## 2011-11-22 NOTE — ED Provider Notes (Signed)
Chief Complaint  Patient presents with  . Constipation    History of Present Illness:   Ms. Stifter is a 66 year old female who complains today of constipation. Her last bowel movement was 3 or 4 days ago when it was normal. Ever since then she's had rectal pain and had difficulty passing a bowel movement. She feels like she has stool in her rectum but just can't pass it. She tried an enema and stool softeners and prune juice without much results. She denies a history blood in the stool. She's had no abdominal pain or bloating. No nausea or vomiting. She was found to have a mass on a chest x-ray and think she may have to have surgery for this. She's going to see her cardiologist Dr. Eden Emms and a thoracic surgeon, Dr. Barry Dienes about this. She hasn't had any problems in the past with constipation or other GI problems. She states her diet is fairly good, she gets fruits, vegetables, and fluids. She feels she could be better about getting regular exercise.  Review of Systems:  Other than noted above, the patient denies any of the following symptoms: Constitutional:  No fever, chills, fatigue, weight loss or anorexia. Lungs:  No cough or shortness of breath. Heart:  No chest pain, palpitations, syncope or edema. Abdomen:  No nausea, vomiting, hematememesis, melena, diarrhea, or hematochezia. GU:  No dysuria, frequency, urgency, or hematuria. Gyn:  No vaginal discharge, itching, abnormal bleeding or pelvic pain. Skin:  No rash or itching.  PMFSH:  Past medical history, family history, social history, meds, and allergies were reviewed.  Physical Exam:   Vital signs:  BP 144/69  Pulse 114  Temp(Src) 98.1 F (36.7 C) (Oral)  Resp 22  SpO2 98% Gen:  Alert, oriented, in no distress. Lungs:  Breath sounds clear and equal bilaterally.  No wheezes, rales or rhonchi. Heart:  Regular rhythm.  No gallops or murmers.  Abdomen:  Soft, flat, nondistended and nontender. No organomegaly or mass. Bowel sounds are  normally active. Rectal exam: External exam was unremarkable. Digital rectal exam reveals a large fecal impaction. No other masses were felt. Skin:  Clear, warm and dry.  No rash.  Course in Urgent Care Center:   The rectum was well lubricated with KY gel and the impaction was digitally removed. The patient did experience some discomfort after this. On completion of the exam only soft stool was palpated in the rectum.   Assessment:   Diagnoses that have been ruled out:  None  Diagnoses that are still under consideration:  None  Final diagnoses:  Fecal impaction    Plan:   1.  The following meds were prescribed:   New Prescriptions   LACTULOSE (CHRONULAC) 10 GM/15ML SOLUTION    Take 30 mLs (20 g total) by mouth 3 (three) times daily.   2.  The patient was instructed in symptomatic care and handouts were given. 3.  The patient was told to return if becoming worse in any way, if no better in 3 or 4 days, and given some red flag symptoms that would indicate earlier return.  Follow up:  The patient was told to follow up suggested the patient followup with her primary care physician. I also suggested that she try to get extra fluids, fiber, and regular exercise.     Reuben Likes, MD 11/22/11 [phone]

## 2011-11-22 NOTE — ED Notes (Signed)
History of constipation.  Patient reports she feels constipated now, onset yesterday.  Reports last bm was Monday or Tuesday.  Rectal pain, pain with defecating.

## 2011-11-22 NOTE — ED Notes (Signed)
Pt. called in and said she has not had a BM yet. I asked when she took the Lactulose and she said she took it at 1400. I told her it may not have time to work yet. I asked if they got a lot of stool out when she was here and she said yes but still thinks she has more. She said she took 2 tsp. I told her she needs to taking 2 tablespoons. She did not have a tablespoon but found one now. I told her to take one tablespoon now and that would be close enough to 2 tablespoons for her first dose, then take 2 more tablespoons at 2200 and 0600.  She said she thinks she did take one more teaspoon. I told her that would still only equal 1 tablespoon ( the big spoon), so she could take 1 tablespoon now. Instructions repeated 2 more times.  Pt. voiced understanding. [name] 11/22/2011

## 2011-11-22 NOTE — Discharge Instructions (Signed)
Fecal Impaction A fecal impaction happens when there is a large, firm amount of stool (poop) that cannot be passed. The impacted stool is usually in the rectum, which is the lowest part of the large bowel. The impacted stool can block the colon and cause significant problems. CAUSES  The longer stool stays in the rectum, the harder it gets. Anything that slows down your bowel movements can lead to fecal impaction. These conditions include:  Constipation (having firm hard stools). This can be a longstanding (chronic) problem, or can happen suddenly (acutely).   Painful conditions of the rectum, such as hemorrhoids or anal fissures. The pain of these conditions can make you try to avoid having bowel movements.   Narcotic pain medications cause constipation, which can sometimes be severe. If you take narcotic pain medication, you should also talk with your caregiver about preventing constipation.   Not getting enough fluids.   Inactivity and bed rest over long periods of time.  SYMPTOMS  Some symptoms of fecal impaction include:  Lack of normal bowel movements or changes in bowel patterns.   Sense of fullness in the rectum, but unable to pass stool.   Pain or cramps in the stomach or abdominal area (often after meals).   Thin, watery discharge from the rectum.  Without treatment, a fecal impaction can block the colon and cause severe abdominal pain or colon tears (perforation). This may lead to surgery.  DIAGNOSIS  Fecal impaction is suspected based upon your symptoms and upon a physical examination. This will include an exam of your rectum, which can confirm the diagnosis. Sometimes x-rays or lab testing may be needed to confirm the diagnosis and be sure there are no other problems.  TREATMENT   Initially an impaction can be removed manually. Your caregiver, using a gloved finger, can remove hard stool from your rectum.   Medication is sometimes needed. A suppository or enema can be  given in the rectum to soften the stool. This can stimulate a bowel movement. Medicines can also be given by mouth (orally).   Surgery may be needed if the colon has torn (perforated) due to blockage. This is very rare.  HOME CARE INSTRUCTIONS   Develop regular bowel habits. This may be something such as getting in the habit of having a bowel movement after your morning cup of coffee or after eating. Be sure to allow yourself enough time on the toilet.   Maintain a high fiber diet.   Drink plenty of fluids each day. This is especially true for the elderly and especially during cold weather when thirst may not be as strong. Try to take in at least eight, 8 ounce glasses of water daily unless instructed otherwise.   Exercise regularly.   If you begin to get constipated, increase the amount of fiber in your diet. Eat plenty of fruits, vegetables, whole wheat breads, bran, oatmeal and similar products.   Natural fiber laxatives such as Metamucil are also very helpful.   Speak with your caregiver if you suspect medications may be causing constipation.  SEEK MEDICAL CARE IF:   You have ongoing constipation or a hard time passing your stools.   You have ongoing rectal pain.   You require enemas or suppositories more than twice a week.  SEEK IMMEDIATE MEDICAL CARE IF:   There are continued problems or you develop abdominal pain.   You develop rectal bleeding.   You develop black or tarry stools or feel lightheaded.  MAKE SURE YOU:  Understand these instructions.   Will watch your condition.   Will get help right away if you are not doing well or get worse.  Document Released: 05/19/2004 Document Revised: 08/16/2011 Document Reviewed: 05/06/2008 [facility] Patient Information 2012 [place], [place].Constipation in Adults Constipation is having fewer than 2 bowel movements per week. Usually, the stools are hard. As we grow older, constipation is more common. If you try to fix constipation  with laxatives, the problem may get worse. This is because laxatives taken over a long period of time make the colon muscles weaker. A low-fiber diet, not taking in enough fluids, and taking some medicines may make these problems worse. MEDICATIONS THAT MAY CAUSE CONSTIPATION  Water pills (diuretics).   Calcium channel blockers (used to control blood pressure and for the heart).   Certain pain medicines (narcotics).   Anticholinergics.   Anti-inflammatory agents.   Antacids that contain aluminum.  DISEASES THAT CONTRIBUTE TO CONSTIPATION  Diabetes.   Parkinson's disease.   Dementia.   Stroke.   Depression.   Illnesses that cause problems with salt and water metabolism.  HOME CARE INSTRUCTIONS   Constipation is usually best cared for without medicines. Increasing dietary fiber and eating more fruits and vegetables is the best way to manage constipation.   Slowly increase fiber intake to 25 to 38 grams per day. Whole grains, fruits, vegetables, and legumes are good sources of fiber. A dietitian can further help you incorporate high-fiber foods into your diet.   Drink enough water and fluids to keep your urine clear or pale yellow.   A fiber supplement may be added to your diet if you cannot get enough fiber from foods.   Increasing your activities also helps improve regularity.   Suppositories, as suggested by your caregiver, will also help. If you are using antacids, such as aluminum or calcium containing products, it will be helpful to switch to products containing magnesium if your caregiver says it is okay.   If you have been given a liquid injection (enema) today, this is only a temporary measure. It should not be relied on for treatment of longstanding (chronic) constipation.   Stronger measures, such as magnesium sulfate, should be avoided if possible. This may cause uncontrollable diarrhea. Using magnesium sulfate may not allow you time to make it to the bathroom.    SEEK IMMEDIATE MEDICAL CARE IF:   There is bright red blood in the stool.   The constipation stays for more than 4 days.   There is belly (abdominal) or rectal pain.   You do not seem to be getting better.   You have any questions or concerns.  MAKE SURE YOU:   Understand these instructions.   Will watch your condition.   Will get help right away if you are not doing well or get worse.  Document Released: 05/25/2004 Document Revised: 08/16/2011 Document Reviewed: 07/31/2011 [facility] Patient Information 2012 [place], [place].

## 2011-11-23 ENCOUNTER — Encounter: Payer: Self-pay | Admitting: Cardiovascular Disease

## 2011-11-23 ENCOUNTER — Ambulatory Visit (INDEPENDENT_AMBULATORY_CARE_PROVIDER_SITE_OTHER): Payer: Self-pay | Admitting: Cardiovascular Disease

## 2011-11-23 ENCOUNTER — Telehealth (HOSPITAL_COMMUNITY): Payer: Self-pay | Admitting: *Deleted

## 2011-11-23 VITALS — BP 138/70 | HR 104 | Wt 229.1 lb

## 2011-11-23 DIAGNOSIS — R222 Localized swelling, mass and lump, trunk: Secondary | ICD-10-CM

## 2011-11-23 DIAGNOSIS — E871 Hypo-osmolality and hyponatremia: Secondary | ICD-10-CM

## 2011-11-23 DIAGNOSIS — E119 Type 2 diabetes mellitus without complications: Secondary | ICD-10-CM

## 2011-11-23 DIAGNOSIS — R011 Cardiac murmur, unspecified: Secondary | ICD-10-CM

## 2011-11-23 DIAGNOSIS — I10 Essential (primary) hypertension: Secondary | ICD-10-CM

## 2011-11-23 DIAGNOSIS — Z01810 Encounter for preprocedural cardiovascular examination: Secondary | ICD-10-CM

## 2011-11-23 DIAGNOSIS — J9859 Other diseases of mediastinum, not elsewhere classified: Secondary | ICD-10-CM

## 2011-11-23 DIAGNOSIS — R002 Palpitations: Secondary | ICD-10-CM

## 2011-11-23 NOTE — Assessment & Plan Note (Signed)
Discussed low carb diet.  Target hemoglobin A1c is 6.5 or less.  Continue current medications.  

## 2011-11-23 NOTE — Assessment & Plan Note (Signed)
Reviewed echo 07/05/11 normal EF AV sclerosis and only mild MR  No need to repeat

## 2011-11-23 NOTE — Patient Instructions (Signed)
Your physician wants you to follow-up in: YEAR WITH DR NISHAN  You will receive a reminder letter in the mail two months in advance. If you don't receive a letter, please call our office to schedule the follow-up appointment.  Your physician recommends that you continue on your current medications as directed. Please refer to the Current Medication list given to you today. 

## 2011-11-23 NOTE — Assessment & Plan Note (Signed)
Reviewed CT  Mass anterior to arch.  Given limited patient inside, obesity and general health may be best to get PET scan and observe.  She is clear to have surgery from a cardiac perspective

## 2011-11-23 NOTE — Assessment & Plan Note (Signed)
Well controlled.  Continue current medications and low sodium Dash type diet.    

## 2011-11-23 NOTE — ED Notes (Signed)
4540 Pt. called on VM and said she was here for impacted stool and no relief yet.  1500  Discussed with Dr. Lorenz Coaster and he said tell pt. she can come back. I called pt. back and she said no stool yet just coming out in drips. She said she took the Lactulose @ 0600 but missed 1400 dose because she was at Dr. Fabio Bering office. He said, "it would be all right for her to have surgery for the mass." I asked her if she told him about the problem and she said yes but he did not say anything about it. I explained how to do an enema. Pt. said she would get one at the pharmacy on the way home and try. [name] 11/23/2011

## 2011-11-23 NOTE — Assessment & Plan Note (Signed)
Benign with normal ECG and normal EF  No ischemia on myovue

## 2011-11-23 NOTE — Assessment & Plan Note (Addendum)
Resolved with fluid in ER.  No meds to make worse.  F/U labs with primary

## 2011-11-23 NOTE — Assessment & Plan Note (Signed)
No cardiovascular contraindications to mediastinal surgery

## 2011-11-23 NOTE — Progress Notes (Signed)
66 yo AAF followed at [facility] with history of hypertension and type 2 diabetes mellitus who presents to ED with palpitations, but no dyspnea or CP. Was found to be hyponatremic which resolved with fluids and was also had CT to rule out PE and incidental mediastinal mass was found Probable thymoma.  Seen by Dr Cornelius Moras.  As far as I can tell has not had PET scan yet.  In ER yesterday for bowel impaction Referred by Dr Cornelius Moras for preoperative evaluation.  No known CAD.  Seen in 2008 with normal echo  Reviewed myovue from 3/6. Normal with no ischemia.  EF 83%  ROS: Denies fever, malais, weight loss, blurry vision, decreased visual acuity, cough, sputum, SOB, hemoptysis, pleuritic pain, palpitaitons, heartburn, abdominal pain, melena, lower extremity edema, claudication, or rash.  All other systems reviewed and negative  General: Affect appropriate Overweight black female HEENT: normal Neck supple with no adenopathy JVP normal no bruits no thyromegaly Lungs clear with no wheezing and good diaphragmatic motion Heart:  S1/S2 benign systolic murmur, no rub, gallop or click PMI normal Abdomen: benighn, BS positve, no tenderness, no AAA no bruit.  No HSM or HJR Distal pulses intact with no bruits Trace bilateral  edema Neuro non-focal Skin warm and dry No muscular weakness   Current Outpatient Prescriptions  Medication Sig Dispense Refill  . acetaminophen (TYLENOL) 500 MG tablet Take 500 mg by mouth every 6 (six) hours as needed. For pain      . aspirin 81 MG tablet Take 81 mg by mouth daily.        Marland Kitchen glipiZIDE (GLUCOTROL) 10 MG tablet Take 10 mg by mouth daily.        Marland Kitchen lactulose (CHRONULAC) 10 GM/15ML solution Take 30 mLs (20 g total) by mouth 3 (three) times daily.  240 mL  0  . loratadine (CLARITIN) 10 MG tablet Take 10 mg by mouth daily.        . metFORMIN (GLUCOPHAGE) 1000 MG tablet Take 1,000 mg by mouth 2 (two) times daily with a meal.        . mineral oil enema Place 1 enema  rectally once. [employer] brand enema      . Nutritional Supplements (EQUATE PO) Take by mouth. Equate stool softner      . rosuvastatin (CRESTOR) 10 MG tablet Take 10 mg by mouth daily.      Marland Kitchen triamcinolone cream (KENALOG) 0.1 % Apply 1 application topically 2 (two) times daily.        Allergies  Review of patient's allergies indicates no known allergies.  Electrocardiogram:  11/16/11  NSR rate high but otherwise normal ECG  Assessment and Plan

## 2011-11-26 ENCOUNTER — Telehealth (HOSPITAL_COMMUNITY): Payer: Self-pay | Admitting: *Deleted

## 2011-11-26 ENCOUNTER — Encounter: Payer: Self-pay | Admitting: Thoracic Surgery (Cardiothoracic Vascular Surgery)

## 2011-11-26 ENCOUNTER — Ambulatory Visit (INDEPENDENT_AMBULATORY_CARE_PROVIDER_SITE_OTHER): Payer: Self-pay | Admitting: Thoracic Surgery (Cardiothoracic Vascular Surgery)

## 2011-11-26 ENCOUNTER — Other Ambulatory Visit: Payer: Self-pay | Admitting: Thoracic Surgery (Cardiothoracic Vascular Surgery)

## 2011-11-26 VITALS — BP 148/74 | HR 86 | Resp 20 | Ht 62.5 in | Wt 234.0 lb

## 2011-11-26 DIAGNOSIS — J9859 Other diseases of mediastinum, not elsewhere classified: Secondary | ICD-10-CM

## 2011-11-26 DIAGNOSIS — R222 Localized swelling, mass and lump, trunk: Secondary | ICD-10-CM

## 2011-11-26 NOTE — Progress Notes (Signed)
                   [address]            [name] [address]          [phone]     CARDIOTHORACIC SURGERY OFFICE NOTE  Referring Provider is Edd Arbour, MD PCP is Georganna Skeans, MD, MD   HPI:  Patient returns for followup of her anterior mediastinal mass. She was last seen here in the office 2 weeks ago. Since then she has been seen by Dr. Eden Emms and cleared for possible elective surgical intervention. She was also seen at urgent care last week because of fecal impaction. She returns to the office today for further followup of her anterior mediastinal mass.  She reports no new problems or complaints, but she is now much more worried about the possibility of having elective surgery.   Current Outpatient Prescriptions  Medication Sig Dispense Refill  . acetaminophen (TYLENOL) 500 MG tablet Take 500 mg by mouth every 6 (six) hours as needed. For pain      . aspirin 81 MG tablet Take 81 mg by mouth daily.        Marland Kitchen glipiZIDE (GLUCOTROL) 10 MG tablet Take 10 mg by mouth daily.        Marland Kitchen loratadine (CLARITIN) 10 MG tablet Take 10 mg by mouth daily.        . metFORMIN (GLUCOPHAGE) 1000 MG tablet Take 1,000 mg by mouth 2 (two) times daily with a meal.        . polyethylene glycol (MIRALAX / GLYCOLAX) packet Take 17 g by mouth daily.      . rosuvastatin (CRESTOR) 10 MG tablet Take 10 mg by mouth daily.      Marland Kitchen triamcinolone cream (KENALOG) 0.1 % Apply 1 application topically 2 (two) times daily.          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  The  most definitive means to rule out a potentially worrisome pathologic process would be to proceed with surgical excision. However, the radiographic appearance of this mass on recent chest CT scan is not particularly worrisome. If this lesion was not hypermetabolic on PET/CT imaging, it would be reasonable to continue to follow the patient conservatively.  At the time of the patient's last office visit she wanted to proceed with elective surgery. Over the last 2 weeks she has changed her mind.   Plan:  We again discussed options briefly here in the office today. Because the patient is now reluctant to proceed with elective surgery, we will send her for PET/CT scan to see if this lesion is hypermetabolic. We will see her back in the office once the scan has been completed.   Salvatore Decent. Cornelius Moras, MD 11/26/2011 4:34 PM

## 2011-11-26 NOTE — ED Notes (Signed)
3/15 1546  The patient called on VM and said they don't have and enema kit at [facility] or [facility]. I called pt. back and told her she could try a medical supply store.  She said she finally got relief on Fri. She is out of the Lactulose and called Dr. Andrey Campanile today. He told her to use Miralax or Colace. She got some Miralax. She asked if she needs to take it with a lot of water with it. I told her to follow the instructions on the bottle. I told her she should drink a lot of water to keep her stool soft.  I also told her she could get dependant on the laxatives, so only use them when she needs it. I told her to increase the fiber in her diet.  Pt. asked what foods have fiber in them. I named whole grains, raw fruit, vegetables nuts, salads. Pt. voiced understanding. Samantha [name] 11/26/2011

## 2011-11-29 ENCOUNTER — Telehealth (HOSPITAL_COMMUNITY): Payer: Self-pay | Admitting: *Deleted

## 2011-11-29 NOTE — ED Notes (Signed)
1343 Pt. called and said the doctor at [facility] told her to use Miralax and Colace for her constipation but is having no results. I told her she needs to call [facility] since that doctor prescribed that regimen for her. Pt. voiced understanding. Samantha [name] 11/29/2011

## 2011-12-06 ENCOUNTER — Other Ambulatory Visit (HOSPITAL_COMMUNITY): Payer: Self-pay

## 2011-12-13 ENCOUNTER — Encounter (HOSPITAL_COMMUNITY)
Admission: RE | Admit: 2011-12-13 | Discharge: 2011-12-13 | Disposition: A | Discharge status: Home / Self Care | Payer: Self-pay | Source: Ambulatory Visit | Attending: Thoracic Surgery (Cardiothoracic Vascular Surgery) | Admitting: Thoracic Surgery (Cardiothoracic Vascular Surgery)

## 2011-12-13 ENCOUNTER — Encounter (HOSPITAL_COMMUNITY): Payer: Self-pay

## 2011-12-13 DIAGNOSIS — E041 Nontoxic single thyroid nodule: Secondary | ICD-10-CM | POA: Insufficient documentation

## 2011-12-13 DIAGNOSIS — M949 Disorder of cartilage, unspecified: Secondary | ICD-10-CM | POA: Insufficient documentation

## 2011-12-13 DIAGNOSIS — R222 Localized swelling, mass and lump, trunk: Secondary | ICD-10-CM | POA: Insufficient documentation

## 2011-12-13 DIAGNOSIS — J9859 Other diseases of mediastinum, not elsewhere classified: Secondary | ICD-10-CM

## 2011-12-13 DIAGNOSIS — M899 Disorder of bone, unspecified: Secondary | ICD-10-CM | POA: Insufficient documentation

## 2011-12-13 DIAGNOSIS — Q619 Cystic kidney disease, unspecified: Secondary | ICD-10-CM | POA: Insufficient documentation

## 2011-12-13 DIAGNOSIS — I2510 Atherosclerotic heart disease of native coronary artery without angina pectoris: Secondary | ICD-10-CM | POA: Insufficient documentation

## 2011-12-13 MED ORDER — FLUDEOXYGLUCOSE F - 18 (FDG) INJECTION
17.0000 | Freq: Once | INTRAVENOUS | Status: AC | PRN
  Administered 2011-12-13: 17 via INTRAVENOUS

## 2011-12-24 ENCOUNTER — Other Ambulatory Visit: Payer: Self-pay | Admitting: Thoracic Surgery (Cardiothoracic Vascular Surgery)

## 2011-12-24 ENCOUNTER — Encounter: Payer: Self-pay | Admitting: Thoracic Surgery (Cardiothoracic Vascular Surgery)

## 2011-12-24 ENCOUNTER — Ambulatory Visit (INDEPENDENT_AMBULATORY_CARE_PROVIDER_SITE_OTHER): Payer: Self-pay | Admitting: Thoracic Surgery (Cardiothoracic Vascular Surgery)

## 2011-12-24 VITALS — BP 141/76 | HR 90 | Resp 16 | Ht 62.5 in | Wt 234.0 lb

## 2011-12-24 DIAGNOSIS — R222 Localized swelling, mass and lump, trunk: Secondary | ICD-10-CM

## 2011-12-24 DIAGNOSIS — J9859 Other diseases of mediastinum, not elsewhere classified: Secondary | ICD-10-CM

## 2011-12-24 DIAGNOSIS — G9589 Other specified diseases of spinal cord: Secondary | ICD-10-CM

## 2011-12-24 NOTE — Progress Notes (Signed)
[address]            [name] [address]          [phone]     CARDIOTHORACIC SURGERY OFFICE NOTE  Referring Provider is Edd Arbour, MD PCP is Georganna Skeans, MD, MD   HPI:  Patient returns to the office today for followup of her small anterior mediastinal mass. She was last seen here in the office on 11/26/2011. At that time she elected to hold off on elective surgical excision and to proceed with PET/CT imaging to see whether or not the lesion was hypermetabolic, hoping to avoid surgical intervention if possible.  The PET CT scan finally got done last week and she now returns for followup today. The patient reports that she feels fine. She has had no chest pain. She's had no shortness of breath. She's had no palpitations. She denies any pain in her neck.   Current Outpatient Prescriptions  Medication Sig Dispense Refill  . acetaminophen (TYLENOL) 500 MG tablet Take 500 mg by mouth every 6 (six) hours as needed. For pain      . aspirin 81 MG tablet Take 81 mg by mouth daily.        Marland Kitchen glipiZIDE (GLUCOTROL) 10 MG tablet Take 10 mg by mouth daily.        Marland Kitchen loratadine (CLARITIN) 10 MG tablet Take 10 mg by mouth daily.        . metFORMIN (GLUCOPHAGE) 1000 MG tablet Take 1,000 mg by mouth 2 (two) times daily with a meal.        . polyethylene glycol (MIRALAX / GLYCOLAX) packet Take 17 g by mouth daily.      . rosuvastatin (CRESTOR) 10 MG tablet Take 10 mg by mouth daily.      Marland Kitchen triamcinolone cream (KENALOG) 0.1 % Apply 1 application topically 2 (two) times daily.          Physical Exam:   BP 141/76  Pulse 90  Resp 16  Ht 5' 2.5" (1.588 m)  Wt 234 lb (106.142 kg)  BMI 42.12 kg/m2  SpO2 97%  General:  Well-appearing  Neck:   Non-tender on palpation of spine, no palpable adenopathy  Chest:   Clear to auscultation  CV:   Regular rate and rhythm  Incisions:  n/a  Abdomen:  Soft and nontender  Extremities:  Warm and  well-perfused  Diagnostic Tests:  *RADIOLOGY REPORT*  Clinical Data: Initial treatment strategy for mediastinal mass.  NUCLEAR MEDICINE PET SKULL BASE TO THIGH  Fasting Blood Glucose: 104  Technique: 17.0 mCi F-18 FDG was injected intravenously. CT data  was obtained and used for attenuation correction and anatomic  localization only. (This was not acquired as a diagnostic CT  examination.) Additional exam technical data entered on  technologist worksheet.  Comparison: [facility] CT chest dated 10/18/2011  Findings:  Neck: 1.4 x 1.1 cm right level IIA lymph node (series 2/image 15),  max SUV 7.2. 1.7 x 2.4 cm left inferior thyroid nodule (series  2/image 44), non-FDG-avid.  Chest: 3.4 x 3.1 cm lobulated anterior mediastinal mass (series  2/image 68), max SUV 5.9. No hypermetabolic mediastinal or hilar  nodes. No suspicious pulmonary nodules on the CT scan. Coronary  atherosclerosis.  Abdomen/Pelvis: No abnormal hypermetabolic activity within the  liver, pancreas, adrenal glands, or spleen. No hypermetabolic  lymph nodes in the abdomen or pelvis. 2.7  cm right lower pole cyst  (series 2/image 138), non-FDG-avid.  [place]: Hypermetabolic focus in the right posterior elements of  the cervical spine with max SUV 5.4, with suspected underlying  lytic lesion in the right superior facet of C5 (series 2/image 28).  IMPRESSION:  3.4 x 3.1 cm lobulated anterior mediastinal mass, max SUV 5.9.  1.4 x 1.1 cm right level IIA lymph node, max SUV 7.2, worrisome for  nodal metastasis.  Suspected lytic lesion in the right superior facet of C5, max SUV  5.9, worrisome for osseous metastasis.  Differential considerations for the above findings include  lymphoma, metastatic thymic neoplasm, or less likely thymoma with a  separate head/neck cancer (with nonvisualized primary).  Age advanced coronary atherosclerosis.  Original Report Authenticated By: Charline Bills,  M.D.    Impression:  Unfortunately the patient's anterior mediastinal mass is densely hypermetabolic on PET imaging and suspicious for malignancy. Furthermore, there is a hypermetabolic lytic lesion noted in the superior facet of the fifth cervical vertebra, worrisome for osseous metastasis. There is also an area of slightly increased uptake in the right lower neck were if some for nodal metastasis.   Plan:  I reviewed results of the PET scan with the patient and her daughter here in the office today. Given the suspicious appearance and the concern for possible metastatic disease, it seems most appropriate to proceed with transthoracic needle biopsy of the anterior mediastinal And a rightmass. The location of this mass should make a fairly accessible for transthoracic needle biopsy, including possible core needle biopsy to rule out lymphoma. Chamberlain procedure or partial sternotomy could be considered if needle biopsy is nondiagnostic. We will also get an MRI of the cervical spine to further evaluate the lytic lesion in C5.  We will see her back in one week to review results of both.   Salvatore Decent. Cornelius Moras, MD 12/24/2011 4:50 PM

## 2011-12-25 ENCOUNTER — Ambulatory Visit
Admission: RE | Admit: 2011-12-25 | Discharge: 2011-12-25 | Disposition: A | Discharge status: Home / Self Care | Payer: No Typology Code available for payment source | Source: Ambulatory Visit | Attending: Thoracic Surgery (Cardiothoracic Vascular Surgery) | Admitting: Thoracic Surgery (Cardiothoracic Vascular Surgery)

## 2011-12-25 ENCOUNTER — Other Ambulatory Visit: Payer: Self-pay | Admitting: Thoracic Surgery (Cardiothoracic Vascular Surgery)

## 2011-12-25 DIAGNOSIS — G9589 Other specified diseases of spinal cord: Secondary | ICD-10-CM

## 2011-12-25 DIAGNOSIS — R222 Localized swelling, mass and lump, trunk: Secondary | ICD-10-CM

## 2011-12-25 MED ORDER — GADOBENATE DIMEGLUMINE 529 MG/ML IV SOLN
19.0000 mL | Freq: Once | INTRAVENOUS | Status: AC | PRN
  Administered 2011-12-25: 19 mL via INTRAVENOUS

## 2011-12-26 ENCOUNTER — Other Ambulatory Visit: Payer: Self-pay | Admitting: Radiology

## 2011-12-31 ENCOUNTER — Ambulatory Visit (HOSPITAL_COMMUNITY)
Admission: RE | Admit: 2011-12-31 | Discharge: 2011-12-31 | Disposition: A | Discharge status: Home / Self Care | Payer: Self-pay | Source: Ambulatory Visit | Attending: Thoracic Surgery (Cardiothoracic Vascular Surgery) | Admitting: Thoracic Surgery (Cardiothoracic Vascular Surgery)

## 2011-12-31 ENCOUNTER — Encounter (HOSPITAL_COMMUNITY): Payer: Self-pay

## 2011-12-31 DIAGNOSIS — R222 Localized swelling, mass and lump, trunk: Secondary | ICD-10-CM

## 2011-12-31 DIAGNOSIS — D150 Benign neoplasm of thymus: Secondary | ICD-10-CM | POA: Insufficient documentation

## 2011-12-31 DIAGNOSIS — I10 Essential (primary) hypertension: Secondary | ICD-10-CM | POA: Insufficient documentation

## 2011-12-31 DIAGNOSIS — E7800 Pure hypercholesterolemia, unspecified: Secondary | ICD-10-CM | POA: Insufficient documentation

## 2011-12-31 DIAGNOSIS — R0989 Other specified symptoms and signs involving the circulatory and respiratory systems: Secondary | ICD-10-CM | POA: Insufficient documentation

## 2011-12-31 DIAGNOSIS — R0609 Other forms of dyspnea: Secondary | ICD-10-CM | POA: Insufficient documentation

## 2011-12-31 DIAGNOSIS — E119 Type 2 diabetes mellitus without complications: Secondary | ICD-10-CM | POA: Insufficient documentation

## 2011-12-31 LAB — CBC
HCT: 39.4 % (ref 36.0–46.0)
Hemoglobin: 12.7 g/dL (ref 12.0–15.0)
MCH: 29.1 pg (ref 26.0–34.0)
MCHC: 32.2 g/dL (ref 30.0–36.0)
MCV: 90.4 fL (ref 78.0–100.0)
RDW: 13.3 % (ref 11.5–15.5)

## 2011-12-31 LAB — GLUCOSE, CAPILLARY
Glucose-Capillary: 124 mg/dL — ABNORMAL HIGH (ref 70–99)
Glucose-Capillary: 106 mg/dL — ABNORMAL HIGH (ref 70–99)

## 2011-12-31 LAB — APTT: aPTT: 38 s — ABNORMAL HIGH (ref 24–37)

## 2011-12-31 LAB — PROTIME-INR: INR: 1.04 (ref 0.00–1.49)

## 2011-12-31 MED ORDER — SODIUM CHLORIDE 0.9 % IV SOLN: Freq: Once | INTRAVENOUS | Status: DC

## 2011-12-31 MED ORDER — FENTANYL CITRATE 0.05 MG/ML IJ SOLN
INTRAMUSCULAR | Status: AC | PRN
  Administered 2011-12-31 (×2): 50 ug via INTRAVENOUS

## 2011-12-31 MED ORDER — MIDAZOLAM HCL 5 MG/5ML IJ SOLN
INTRAMUSCULAR | Status: AC | PRN
  Administered 2011-12-31: 2 mg via INTRAVENOUS

## 2011-12-31 MED ORDER — FENTANYL CITRATE 0.05 MG/ML IJ SOLN
INTRAMUSCULAR | Status: AC
  Filled 2011-12-31: qty 4

## 2011-12-31 MED ORDER — MIDAZOLAM HCL 2 MG/2ML IJ SOLN
INTRAMUSCULAR | Status: AC
  Filled 2011-12-31: qty 6

## 2011-12-31 NOTE — H&P (Signed)
Samantha [name] is an 66 y.o. female.   Chief Complaint: Dyspnea; mediastinal mass Scheduled for mediastinal mass biopsy HPI: HTN; DM; high cholesterol  Past Medical History  Diagnosis Date  . HYPERTENSION, BENIGN ESSENTIAL   . HEMATURIA, MICROSCOPIC   . DM   . DM W/NEURO MNFST, TYPE II, UNCONTROLLED   . Palpitations   . Dysrhythmia     " heart Palpatations "  . Mediastinal mass 10/18/2011    noted on CT angiogram of chest  . Constipation   . High cholesterol     Past Surgical History  Procedure Date  . Kelman phacoemulsification cataract     No family history on file. Social History:  reports that she quit smoking about 15 months ago. She has never used smokeless tobacco. She reports that she does not drink alcohol or use illicit drugs.  Allergies:  Allergies  Allergen Reactions  . Lisinopril Other (See Comments)    unknown     (Not in a hospital admission)  Results for orders placed during the hospital encounter of 12/31/11 (from the past 48 hour(s))  APTT     Status: Abnormal   Collection Time   12/31/11  9:28 AM      Component Value Range Comment   aPTT 38 (*) 24 - 37 (seconds)   CBC     Status: Normal   Collection Time   12/31/11  9:28 AM      Component Value Range Comment   WBC 8.3  4.0 - 10.5 (K/uL)    RBC 4.36  3.87 - 5.11 (MIL/uL)    Hemoglobin 12.7  12.0 - 15.0 (g/dL)    HCT 13.0  86.5 - 78.4 (%)    MCV 90.4  78.0 - 100.0 (fL)    MCH 29.1  26.0 - 34.0 (pg)    MCHC 32.2  30.0 - 36.0 (g/dL)    RDW 69.6  29.5 - 28.4 (%)    Platelets 333  150 - 400 (K/uL)   PROTIME-INR     Status: Normal   Collection Time   12/31/11  9:28 AM      Component Value Range Comment   Prothrombin Time 13.8  11.6 - 15.2 (seconds)    INR 1.04  0.00 - 1.49     No results found.  Review of Systems  Constitutional: Negative for fever.  Respiratory: Positive for shortness of breath. Negative for cough.   Cardiovascular: Negative for chest pain.  Gastrointestinal: Negative  for nausea and vomiting.  Neurological: Negative for dizziness.    Blood pressure 156/75, pulse 86, temperature 96.8 F (36 C), temperature source Oral, resp. rate 20, height 5' 2.5" (1.588 m), weight 230 lb (104.327 kg), SpO2 100.00%. Physical Exam  Constitutional: She is oriented to person, place, and time. She appears well-developed and well-nourished.  Cardiovascular: Normal rate, regular rhythm and normal heart sounds.   No murmur heard. Respiratory: Effort normal and breath sounds normal. She has no wheezes.  GI: Soft. Bowel sounds are normal. There is no tenderness.  Musculoskeletal: Normal range of motion.  Neurological: She is alert and oriented to person, place, and time. No cranial nerve deficit.  Skin: Skin is warm.  Psychiatric: She has a normal mood and affect. Her behavior is normal. Judgment and thought content normal.     Assessment/Plan Dyspnea; mediastinal mass seen on CT Scheduled now for mass biopsy Pt aware of procedure benefits and risks and agreeable to proceed. Consent signed.  Kendallyn Lippold A 12/31/2011, 10:23 AM

## 2011-12-31 NOTE — ED Notes (Signed)
O2 at 2L [place]

## 2011-12-31 NOTE — Progress Notes (Signed)
Mid chest bandaid clean, dry, intact

## 2011-12-31 NOTE — ED Notes (Signed)
CBG 106 

## 2011-12-31 NOTE — Discharge Instructions (Signed)
Biopsy  Care After  Refer to this sheet in the next few weeks. These instructions provide you with information on caring for yourself after your procedure. Your caregiver may also give you more specific instructions. Your treatment has been planned according to current medical practices, but problems sometimes occur. Call your caregiver if you have any problems or questions after your procedure.  If you had a fine needle biopsy, you may have soreness at the biopsy site for 1 to 2 days. If you had an open biopsy, you may have soreness at the biopsy site for 3 to 4 days.  HOME CARE INSTRUCTIONS    You may resume normal diet and activities as directed.   Change bandages (dressings) as directed. If your wound was closed with a skin glue (adhesive), it will wear off and begin to peel in 7 days.   Only take over-the-counter or prescription medicines for pain, discomfort, or fever as directed by your caregiver.   Ask your caregiver when you can bathe and get your wound wet.  SEEK IMMEDIATE MEDICAL CARE IF:    You have increased bleeding (more than a small spot) from the biopsy site.   You notice redness, swelling, or increasing pain at the biopsy site.   You have pus coming from the biopsy site.   You have a fever.   You notice a bad smell coming from the biopsy site or dressing.   You have a rash, have difficulty breathing, or have any allergic problems.  MAKE SURE YOU:    Understand these instructions.   Will watch your condition.   Will get help right away if you are not doing well or get worse.  Document Released: 03/16/2005 Document Revised: 08/16/2011 Document Reviewed: 02/22/2011  ExitCare Patient Information 2012 [employer].

## 2011-12-31 NOTE — Procedures (Signed)
Ant med mass Bx Core No comp

## 2012-01-04 ENCOUNTER — Encounter: Payer: Self-pay | Admitting: Thoracic Surgery (Cardiothoracic Vascular Surgery)

## 2012-01-04 ENCOUNTER — Other Ambulatory Visit: Payer: Self-pay

## 2012-01-04 ENCOUNTER — Ambulatory Visit: Payer: Self-pay | Admitting: Thoracic Surgery (Cardiothoracic Vascular Surgery)

## 2012-01-04 ENCOUNTER — Ambulatory Visit (INDEPENDENT_AMBULATORY_CARE_PROVIDER_SITE_OTHER): Payer: Self-pay | Admitting: Thoracic Surgery (Cardiothoracic Vascular Surgery)

## 2012-01-04 VITALS — BP 158/80 | HR 92 | Resp 20 | Ht 62.5 in | Wt 230.0 lb

## 2012-01-04 DIAGNOSIS — J9859 Other diseases of mediastinum, not elsewhere classified: Secondary | ICD-10-CM

## 2012-01-04 DIAGNOSIS — Z9889 Other specified postprocedural states: Secondary | ICD-10-CM | POA: Insufficient documentation

## 2012-01-04 DIAGNOSIS — R222 Localized swelling, mass and lump, trunk: Secondary | ICD-10-CM

## 2012-01-04 NOTE — Progress Notes (Signed)
[address]            [name] [address]          [phone]     CARDIOTHORACIC SURGERY OFFICE NOTE  Referring Provider is Edd Arbour, MD PCP is Georganna Skeans, MD, MD   HPI:  Patient returns for followup of her anterior mediastinal mass.  Since her last office visit she underwent transthoracic core needle biopsy of her mass. She also had MRI of the cervical spine. She returns to the office to discuss these results. She reports no new problems or complaints.   Current Outpatient Prescriptions  Medication Sig Dispense Refill  . acetaminophen (TYLENOL) 500 MG tablet Take 500 mg by mouth every 6 (six) hours as needed. For pain      . aspirin 81 MG tablet Take 81 mg by mouth daily.        Marland Kitchen glipiZIDE (GLUCOTROL) 10 MG tablet Take 10 mg by mouth daily.        Marland Kitchen loratadine (CLARITIN) 10 MG tablet Take 10 mg by mouth daily.        . metFORMIN (GLUCOPHAGE) 1000 MG tablet Take 1,000 mg by mouth 2 (two) times daily with a meal.        . rosuvastatin (CRESTOR) 10 MG tablet Take 10 mg by mouth daily.      Marland Kitchen triamcinolone cream (KENALOG) 0.1 % Apply 1 application topically 2 (two) times daily.          Physical Exam:   BP 158/80  Pulse 92  Resp 20  Ht 5' 2.5" (1.588 m)  Wt 230 lb (104.327 kg)  BMI 41.40 kg/m2  SpO2 97%  General:  Obese but well-appearing  Chest:   Clear to auscultation  CV:   Regular rate and rhythm  Incisions:  n/a  Abdomen:  Soft and nontender  Extremities:  Warm and well-perfused  Diagnostic Tests:   MRI CERVICAL SPINE WITHOUT AND WITH CONTRAST 12/25/2011  Technique: Multiplanar and multiecho pulse sequences of the  cervical spine, to include the craniocervical junction and  cervicothoracic junction, were obtained according to standard  protocol without and with intravenous contrast.  Contrast: 19mL MULTIHANCE GADOBENATE DIMEGLUMINE 529 MG/ML IV SOLN  BUN and creatinine were obtained on site at [facility]  at  [address]  Results: BUN 13.0 mg/dL, Creatinine 0.7 mg/dL.  Comparison: PET scan 12/13/2011.  Findings: Mild reversal of the normal cervical lordotic curve.  Disc space narrowing C5-6 and C6-7. Mild endplate reactive  changes.  Abnormal marrow edema inferior articular process of C4 and superior  articular process of C5, with a facet joint effusion, all on the  right. Subchondral geode projects into the inferior articular  process of C4, subcentimeter in size (image 2 of series 9). Post  contrast, there is mild enhancement of the joint capsule as well as  the marrow on the right at the C4-5 facet consistent with  acute/advanced unilateral facet osteoarthritis. The findings are  not suggestive of metastatic disease. No other similar areas of  bone marrow edema or abnormal enhancement are identified.  Normal cord size and signal throughout. No craniocervical junction  abnormality. Mild pannus. No visible epidural or prevertebral  mass or enhancement to suggest intraspinal neoplasm.  The individual disc spaces were examined as follows:  C2-3: Shallow central protrusion is non compressive.  C3-4: Shallow central protrusion is non  compressive.  C4-5: Mild bulge. Right-sided facet arthropathy does not narrow  the foramen.  C5-6: Shallow central protrusion does not compress the cord or  exiting C6 nerve roots.  C6-7: Shallow central protrusion. No neural compression.  C7-T1: No significant abnormality.  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 This imaging appearance does not strongly suggest  metastatic disease, but rather bone marrow edema and subchondral  cystic formation secondary to advanced facet arthropathy.  Mild reversal normal cervical lordotic curve with multiple shallow  disc protrusions which are non compressive.  No areas of abnormal enhancement or osseous destruction are seen to  suggest metastatic disease.    Original Report Authenticated By: Elsie Stain, M.D.  Mediastinum, biopsy - THYMOMA. PLEASE SEE COMMENT. Microscopic Comment The biopsies are composed of abundant small lymphocytes with clusters of epithelioid cells. Immunohistochemical stains were performed using antibodies directly against CD3, CD5, CK and PLAP. 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 There is no evidence of significant cytologic atypia, or necrosis identified in this material. Clinical correlation is highly recommended. Dr. Cornelius Moras was notified on 01/01/12. Dr. Laureen Ochs agrees. 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 I favor proceeding directly to thymectomy for definitive diagnosis and treatment.   Plan:  I've discussed the indications, risks, and potential benefits of thymectomy with the patient here in the office today. She remains reluctant but agreeable to proceed with surgery. She understands and accepts all potential associated risks including but not limited to risk of death, stroke, myocardial infarction, congestive heart failure, respiratory failure, pulmonary embolus, pneumonia, bleeding requiring blood transfusion, arrhythmia, injury to the phrenic nerve on one or both sides, or late recurrence of the underlying disease process. All of her questions been addressed. We plan to proceed with surgery on May 16. The patient will return here to the office for followup on May 13 prior to surgery.   Salvatore Decent. Cornelius Moras, MD 01/04/2012 2:24 PM

## 2012-01-09 ENCOUNTER — Encounter (HOSPITAL_COMMUNITY): Payer: Self-pay | Admitting: Pharmacy Technician

## 2012-01-21 ENCOUNTER — Encounter (HOSPITAL_COMMUNITY)
Admission: RE | Admit: 2012-01-21 | Discharge: 2012-01-21 | Disposition: A | Discharge status: Home / Self Care | Payer: Self-pay | Source: Ambulatory Visit | Attending: Thoracic Surgery (Cardiothoracic Vascular Surgery) | Admitting: Thoracic Surgery (Cardiothoracic Vascular Surgery)

## 2012-01-21 ENCOUNTER — Ambulatory Visit (INDEPENDENT_AMBULATORY_CARE_PROVIDER_SITE_OTHER): Payer: Self-pay | Admitting: Thoracic Surgery (Cardiothoracic Vascular Surgery)

## 2012-01-21 ENCOUNTER — Encounter: Payer: Self-pay | Admitting: Thoracic Surgery (Cardiothoracic Vascular Surgery)

## 2012-01-21 ENCOUNTER — Encounter (HOSPITAL_COMMUNITY): Payer: Self-pay

## 2012-01-21 VITALS — BP 124/70 | HR 82 | Resp 16 | Ht 62.5 in | Wt 230.0 lb

## 2012-01-21 VITALS — BP 153/75 | HR 76 | Temp 97.5°F | Resp 20 | Ht 62.0 in | Wt 223.1 lb

## 2012-01-21 DIAGNOSIS — J9859 Other diseases of mediastinum, not elsewhere classified: Secondary | ICD-10-CM

## 2012-01-21 DIAGNOSIS — R222 Localized swelling, mass and lump, trunk: Secondary | ICD-10-CM

## 2012-01-21 HISTORY — DX: Unspecified osteoarthritis, unspecified site: M19.90

## 2012-01-21 LAB — BLOOD GAS, ARTERIAL
Drawn by: 20636
pCO2 arterial: 40.5 mmHg (ref 35.0–45.0)
pH, Arterial: 7.394 (ref 7.350–7.400)

## 2012-01-21 LAB — URINALYSIS, ROUTINE W REFLEX MICROSCOPIC
Ketones, ur: NEGATIVE mg/dL
Leukocytes, UA: NEGATIVE
Nitrite: NEGATIVE
Urobilinogen, UA: 0.2 mg/dL (ref 0.0–1.0)
pH: 5 (ref 5.0–8.0)

## 2012-01-21 LAB — ABO/RH: ABO/RH(D): AB POS

## 2012-01-21 LAB — PROTIME-INR: INR: 0.95 (ref 0.00–1.49)

## 2012-01-21 LAB — TYPE AND SCREEN

## 2012-01-21 LAB — CBC
Platelets: 295 10*3/uL (ref 150–400)
RDW: 13.3 % (ref 11.5–15.5)
WBC: 8.4 10*3/uL (ref 4.0–10.5)

## 2012-01-21 LAB — COMPREHENSIVE METABOLIC PANEL
AST: 21 U/L (ref 0–37)
Albumin: 3.7 g/dL (ref 3.5–5.2)
Alkaline Phosphatase: 64 U/L (ref 39–117)
Chloride: 104 mEq/L (ref 96–112)
Potassium: 4.2 mEq/L (ref 3.5–5.1)
Total Bilirubin: 0.2 mg/dL — ABNORMAL LOW (ref 0.3–1.2)

## 2012-01-21 LAB — APTT: aPTT: 37 seconds (ref 24–37)

## 2012-01-21 LAB — URINE MICROSCOPIC-ADD ON

## 2012-01-21 NOTE — Progress Notes (Signed)
[address]            [name] [address]          [phone]     CARDIOTHORACIC SURGERY OFFICE NOTE  Referring Provider is Edd Arbour, MD PCP is Georganna Skeans, MD, MD   HPI:  Patient returns for followup with tentative plans to proceed with thymectomy later this week. She was last seen here in the office 2 weeks ago. She reports no new polyps or complaints   Current Outpatient Prescriptions  Medication Sig Dispense Refill  . acetaminophen (TYLENOL) 500 MG tablet Take 500 mg by mouth every 6 (six) hours as needed. For pain      . aspirin 81 MG tablet Take 81 mg by mouth daily.        . Cholecalciferol (D3 ADULT PO) Take 1,000 Units by mouth daily.      Marland Kitchen glipiZIDE (GLUCOTROL) 10 MG tablet Take 10 mg by mouth daily.        Marland Kitchen loratadine (CLARITIN) 10 MG tablet Take 10 mg by mouth daily.        . metFORMIN (GLUCOPHAGE) 1000 MG tablet Take 1,000 mg by mouth 2 (two) times daily with a meal.        . rosuvastatin (CRESTOR) 10 MG tablet Take 10 mg by mouth daily.      Marland Kitchen triamcinolone cream (KENALOG) 0.1 % Apply 1 application topically 2 (two) times daily as needed. For irritation          Physical Exam:   BP 124/70  Pulse 82  Resp 16  Ht 5' 2.5" (1.588 m)  Wt 230 lb (104.327 kg)  BMI 41.40 kg/m2  SpO2 97%  General:  Well-appearing  Chest:   Clear to auscultation  CV:   Regular rate and rhythm  Incisions:  n/a  Abdomen:  Soft and nontender  Extremities:  Warm and well-perfused  Diagnostic Tests:  Results for Samantha [name], Samantha [name] (MRN [id number]) as of 01/21/2012 13:25  Ref. Range 01/21/2012 11:36  Sample type No range found ARTERIAL DRAW  FIO2 No range found 0.21  pH, Arterial Latest Range: 7.350-7.400  7.394  pCO2 arterial Latest Range: 35.0-45.0 mmHg 40.5  pO2, Arterial Latest Range: 80.0-100.0 mmHg 81.8  Bicarbonate Latest Range: 20.0-24.0 mEq/L 24.2 (H)  TCO2 Latest Range: 0-100 mmol/L 25.5  Acid-base deficit Latest Range:  0.0-2.0 mmol/L 0.0  O2 Saturation No range found 96.6  Patient temperature No range found 98.6  Collection site No range found LEFT BRACHIAL  Allens test (pass/fail) Latest Range: PASS  PASS  Sodium Latest Range: 135-145 mEq/L 138  Potassium Latest Range: 3.5-5.1 mEq/L 4.2  Chloride Latest Range: 96-112 mEq/L 104  CO2 Latest Range: 19-32 mEq/L 21  BUN Latest Range: 6-23 mg/dL 15  Creat Latest Range: 0.50-1.10 mg/dL 4.69  Calcium Latest Range: 8.4-10.5 mg/dL 62.9  GFR calc non Af Amer Latest Range: >90 mL/min >90  GFR calc Af Amer Latest Range: >90 mL/min >90  Glucose Latest Range: 70-99 mg/dL 528 (H)  Alkaline Phosphatase Latest Range: 39-117 U/L 64  Albumin Latest Range: 3.5-5.2 g/dL 3.7  AST Latest Range: 0-37 U/L 21  ALT Latest Range: 0-35 U/L 18  Total Protein Latest Range: 6.0-8.3 g/dL 7.8  Total Bilirubin Latest Range: 0.3-1.2 mg/dL 0.2 (L)  WBC Latest Range: 4.0-10.5 K/uL 8.4  RBC Latest Range: 3.87-5.11 MIL/uL 4.32  Hemoglobin Latest Range: 12.0-15.0 g/dL  12.7  HCT Latest Range: 36.0-46.0 % 38.2  MCV Latest Range: 78.0-100.0 fL 88.4  MCH Latest Range: 26.0-34.0 pg 29.4  MCHC Latest Range: 30.0-36.0 g/dL 16.1  RDW Latest Range: 11.5-15.5 % 13.3  Platelets Latest Range: 150-400 K/uL 295  Prothrombin Time Latest Range: 11.6-15.2 seconds 12.9  INR Latest Range: 0.00-1.49  0.95  aPTT Latest Range: 24-37 seconds 37     Impression:  Thymoma  Plan:  Thymectomy on Wednesday, May 15. I've reviewed the indications, risks, and potential benefits of surgery with the patient here in the office today. She understands and accepts also said risks of surgery including but not limited to risk of death, stroke, myocardial infarction, respiratory failure, bleeding requiring blood transfusion, arrhythmia, infection, pneumonia, pulmonary embolus, or late recurrence of underlying disease process. All of her questions been addressed.   Salvatore Decent. Cornelius Moras, MD 01/21/2012 1:24 PM

## 2012-01-21 NOTE — Pre-Procedure Instructions (Signed)
[address] ELE[address] SHIPPEE  01/21/2012   Your procedure is scheduled on:  Wed, May 15 @ 8:30 AM  Report to [facility] at 6:30 AM.  Call this number if you have problems the morning of surgery: [phone]   Remember:   Do not eat food:After Midnight.  May have clear liquids: up to 4 Hours before arrival.(until 2:30 AM)  Clear liquids include soda, tea, black coffee, apple or grape juice, broth,water  Take these medicines the morning of surgery with A SIP OF WATER: Claritin   Do not wear jewelry, make-up or nail polish.  Do not wear lotions, powders, or perfumes.   Do not shave 48 hours prior to surgery.  Do not bring valuables to the hospital.  Contacts, dentures or bridgework may not be worn into surgery.  Leave suitcase in the car. After surgery it may be brought to your room.  For patients admitted to the hospital, checkout time is 11:00 AM the day of discharge.   Special Instructions: CHG Shower Use Special Wash: 1/2 bottle night before surgery and 1/2 bottle morning of surgery.   Please read over the following fact sheets that you were given: Pain Booklet, Coughing and Deep Breathing, Blood Transfusion Information, MRSA Information and Surgical Site Infection Prevention

## 2012-01-21 NOTE — Progress Notes (Addendum)
Pt doesn't have a cardiologist   Stress test in epic from 11/16/11  Echo results in epic from 07/05/11  Dr.Amelia Wilson at [facility] is Medical MD

## 2012-01-21 NOTE — H&P (Signed)
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  I saw the patient in consultation on 10/19/2011.  Thyroid hormone levels were normal, as were quantitative beta HCG and serum alfa fetoprotein levels.  We discussed possible options for further evaluation and treatment, including surgical resection versus PET/CT scan with continued follow up if the lesion was not hypermetabolic on PET in an effort to avoid the risks of surgery.  A time the patient was not interested in considering surgery so we obtained a PET/CT scan that demonstrated hypermetabolic activity in the mass and also raised suspicion regarding the possibility of metastatic disease to her cervical spine.  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  The patient has been cleared for elective surgery by her cardiologist, Dr Eden Emms.  She now presents for elective resection of her anterior mediastinal mass.  Past Medical History  Diagnosis Date  . HEMATURIA, MICROSCOPIC   . DM W/NEURO MNFST, TYPE II, UNCONTROLLED   . Palpitations   . Dysrhythmia     " heart Palpatations "  . Mediastinal mass 10/18/2011    noted on CT angiogram of chest  . Constipation     takes an OTC stool softener and uses an enema every3wks  . High cholesterol     takes Crestor daily  . HYPERTENSION, BENIGN  ESSENTIAL     not on any meds for this  . Arthritis     neck   . DM     takes Glipizide and Metformin daily    Past Surgical History  Procedure Date  . Kelman phacoemulsification cataract 2011    bilateral    Family History  Problem Relation Age of Onset  . Anesthesia problems Neg Hx   . Hypotension Neg Hx   . Malignant hyperthermia Neg Hx   . Pseudochol deficiency Neg Hx     Social History History  Substance Use Topics  . Smoking status: Former Games developer  . Smokeless tobacco: Never Used   Comment: quit in Jan 2012  . Alcohol Use: No    No current facility-administered medications for this encounter.   Current Outpatient Prescriptions  Medication Sig Dispense Refill  . acetaminophen (TYLENOL) 500 MG tablet Take 500 mg by mouth every 6 (six) hours as needed. For pain      . aspirin 81 MG tablet Take 81 mg by mouth daily.        . Cholecalciferol (D3 ADULT PO) Take 1,000 Units by mouth daily.      Marland Kitchen glipiZIDE (GLUCOTROL) 10 MG tablet Take 10 mg by mouth daily.        Marland Kitchen loratadine (CLARITIN) 10 MG tablet Take 10 mg by mouth daily.        . metFORMIN (GLUCOPHAGE) 1000 MG tablet Take 1,000 mg by mouth 2 (two) times daily with a meal.        . rosuvastatin (CRESTOR)  10 MG tablet Take 10 mg by mouth daily.      Marland Kitchen triamcinolone cream (KENALOG) 0.1 % Apply 1 application topically 2 (two) times daily as needed. For irritation        Allergies  Allergen Reactions  . Lisinopril Other (See Comments)    unknown   Review of Systems:             General:                      normal appetite, normal energy               Respiratory:                no cough, no wheezing, no hemoptysis, no pain with inspiration or cough, + exertional shortness of breath               Cardiac:                       no chest pain or tightness, + exertional SOB, no resting SOB, no PND, no orthopnea, no LE edema, + palpitations, no syncope             GI:                                no difficulty  swallowing, no hematochezia, no hematemesis, no melena, no constipation, no diarrhea               GU:                              no dysuria, no urgency, no frequency               Musculoskeletal:         no arthritis, no arthralgia               Vascular:                     no pain suggestive of claudication               Neuro:                         no symptoms suggestive of TIA's, no seizures, no headaches, no peripheral neuropathy             Endocrine:                   Reports difficulty with blood sugar control             HEENT:                       no loose teeth or painful teeth,  no recent vision changes             Psych:                         no anxiety, no depression                Physical Exam:              BP 145/80  Pulse 97  Resp 16  Ht 5' 2.5" (1.588 m)  Wt 230 lb (  104.327 kg)  BMI 41.40 kg/m2  SpO2 97%             General:                      Morbidly obese                HEENT:                       Unremarkable               Neck:                           no JVD, no bruits, no adenopathy               Chest:                         clear to auscultation, symmetrical breath sounds, no wheezes, no rhonchi               CV:                              RRR, no murmur               Abdomen:                    soft, non-tender, no masses               Extremities:                 warm, adequately perfused, pulses not palpable             Rectal/GU                   Deferred             Neuro:                         Grossly non-focal and symmetrical throughout             Skin:                            Clean and dry, no rashes, no breakdown    Diagnostic Tests:  *RADIOLOGY REPORT*   Clinical Data: Initial treatment strategy for mediastinal mass.   NUCLEAR MEDICINE PET SKULL BASE TO THIGH   Fasting Blood Glucose: 104   Technique: 17.0 mCi F-18 FDG was injected intravenously. CT data   was obtained and used for attenuation correction and anatomic     localization only. (This was not acquired as a diagnostic CT   examination.) Additional exam technical data entered on   technologist worksheet.   Comparison: [facility] CT chest dated 10/18/2011   Findings:   Neck: 1.4 x 1.1 cm right level IIA lymph node (series 2/image 15),   max SUV 7.2. 1.7 x 2.4 cm left inferior thyroid nodule (series   2/image 44), non-FDG-avid.   Chest: 3.4 x 3.1 cm lobulated anterior mediastinal mass (series   2/image 68), max SUV 5.9. No hypermetabolic mediastinal or hilar   nodes. No suspicious pulmonary nodules on the CT scan. Coronary   atherosclerosis.   Abdomen/Pelvis: No abnormal hypermetabolic activity within the  liver, pancreas, adrenal glands, or spleen. No hypermetabolic   lymph nodes in the abdomen or pelvis. 2.7 cm right lower pole cyst   (series 2/image 138), non-FDG-avid.   [place]: Hypermetabolic focus in the right posterior elements of   the cervical spine with max SUV 5.4, with suspected underlying   lytic lesion in the right superior facet of C5 (series 2/image 28).   IMPRESSION:   3.4 x 3.1 cm lobulated anterior mediastinal mass, max SUV 5.9.   1.4 x 1.1 cm right level IIA lymph node, max SUV 7.2, worrisome for   nodal metastasis.   Suspected lytic lesion in the right superior facet of C5, max SUV   5.9, worrisome for osseous metastasis.   Differential considerations for the above findings include   lymphoma, metastatic thymic neoplasm, or less likely thymoma with a   separate head/neck cancer (with nonvisualized primary).   Age advanced coronary atherosclerosis.   Original Report Authenticated By: Charline Bills, M.D.   MRI CERVICAL SPINE WITHOUT AND WITH CONTRAST 12/25/2011  Technique: Multiplanar and multiecho pulse sequences of the   cervical spine, to include the craniocervical junction and   cervicothoracic junction, were obtained according to standard   protocol without and with intravenous contrast.   Contrast: 19mL  MULTIHANCE GADOBENATE DIMEGLUMINE 529 MG/ML IV SOLN   BUN and creatinine were obtained on site at [facility] at   [address]   Results: BUN 13.0 mg/dL, Creatinine 0.7 mg/dL.   Comparison: PET scan 12/13/2011.   Findings: Mild reversal of the normal cervical lordotic curve.   Disc space narrowing C5-6 and C6-7. Mild endplate reactive   changes.   Abnormal marrow edema inferior articular process of C4 and superior   articular process of C5, with a facet joint effusion, all on the   right. Subchondral geode projects into the inferior articular   process of C4, subcentimeter in size (image 2 of series 9). Post   contrast, there is mild enhancement of the joint capsule as well as   the marrow on the right at the C4-5 facet consistent with   acute/advanced unilateral facet osteoarthritis. The findings are   not suggestive of metastatic disease. No other similar areas of   bone marrow edema or abnormal enhancement are identified.   Normal cord size and signal throughout. No craniocervical junction   abnormality. Mild pannus. No visible epidural or prevertebral   mass or enhancement to suggest intraspinal neoplasm.   The individual disc spaces were examined as follows:   C2-3: Shallow central protrusion is non compressive.   C3-4: Shallow central protrusion is non compressive.   C4-5: Mild bulge. Right-sided facet arthropathy does not narrow   the foramen.   C5-6: Shallow central protrusion does not compress the cord or   exiting C6 nerve roots.   C6-7: Shallow central protrusion. No neural compression.   C7-T1: No significant abnormality.   IMPRESSION:   Right-sided facet arthropathy at C4-5 correlates with the PET scan   findings. This imaging appearance does not strongly suggest   metastatic disease, but rather bone marrow edema and subchondral   cystic formation secondary to advanced facet arthropathy.   Mild reversal normal cervical lordotic curve with multiple  shallow   disc protrusions which are non compressive.   No areas of abnormal enhancement or osseous destruction are seen to   suggest metastatic disease.   Original Report Authenticated By: Elsie Stain, M.D.  Mediastinum, biopsy - THYMOMA. PLEASE SEE COMMENT. Microscopic  Comment The biopsies are composed of abundant small lymphocytes with clusters of epithelioid cells. Immunohistochemical stains were performed using antibodies directly against CD3, CD5, CK and PLAP. 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 There is no evidence of significant cytologic atypia, or necrosis identified in this material. Clinical correlation is highly recommended. Dr. Cornelius Moras was notified on 01/01/12. Dr. Laureen Ochs agrees. 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   Plan:  Thymectomy.  I've discussed the indications, risks, and potential benefits of surgery at length with the patient. Alternative treatment strategies been discussed. She understands and accepts all potential associated risks of surgery including but not limited to was a death, stroke, myocardial infarction, respiratory failure, pneumonia, pulmonary embolus, bleeding requiring blood transfusion, arrhythmia, infection or failure of wound to heal, injury to the nerves innervating one or both diaphragm, or late recurrence of the underlying disease process. All of her questions been addressed.    Salvatore Decent. Cornelius Moras, MD

## 2012-01-22 MED ORDER — DEXTROSE 5 % IV SOLN
1.5000 g | INTRAVENOUS | Status: AC
  Administered 2012-01-23: 1.5 g via INTRAVENOUS
  Filled 2012-01-22 (×2): qty 1.5

## 2012-01-23 ENCOUNTER — Encounter (HOSPITAL_COMMUNITY): Payer: Self-pay | Admitting: *Deleted

## 2012-01-23 ENCOUNTER — Inpatient Hospital Stay (HOSPITAL_COMMUNITY): Payer: Self-pay

## 2012-01-23 ENCOUNTER — Ambulatory Visit (HOSPITAL_COMMUNITY): Payer: Self-pay | Admitting: Certified Registered"

## 2012-01-23 ENCOUNTER — Encounter (HOSPITAL_COMMUNITY): Payer: Self-pay | Admitting: Thoracic Surgery (Cardiothoracic Vascular Surgery)

## 2012-01-23 ENCOUNTER — Encounter (HOSPITAL_COMMUNITY): Payer: Self-pay | Admitting: Certified Registered"

## 2012-01-23 ENCOUNTER — Encounter (HOSPITAL_COMMUNITY)
Admission: RE | Disposition: A | Discharge status: Skilled Nursing Facility | Payer: Self-pay | Source: Ambulatory Visit | Attending: Thoracic Surgery (Cardiothoracic Vascular Surgery)

## 2012-01-23 ENCOUNTER — Inpatient Hospital Stay (HOSPITAL_COMMUNITY)
Admission: RE | Admit: 2012-01-23 | Discharge: 2012-01-29 | DRG: 803 | Disposition: A | Discharge status: Skilled Nursing Facility | Payer: MEDICAID | Source: Ambulatory Visit | Attending: Thoracic Surgery (Cardiothoracic Vascular Surgery) | Admitting: Thoracic Surgery (Cardiothoracic Vascular Surgery)

## 2012-01-23 DIAGNOSIS — Z79899 Other long term (current) drug therapy: Secondary | ICD-10-CM

## 2012-01-23 DIAGNOSIS — I10 Essential (primary) hypertension: Secondary | ICD-10-CM | POA: Diagnosis present

## 2012-01-23 DIAGNOSIS — Z888 Allergy status to other drugs, medicaments and biological substances status: Secondary | ICD-10-CM

## 2012-01-23 DIAGNOSIS — E871 Hypo-osmolality and hyponatremia: Secondary | ICD-10-CM | POA: Diagnosis not present

## 2012-01-23 DIAGNOSIS — E109 Type 1 diabetes mellitus without complications: Secondary | ICD-10-CM | POA: Diagnosis present

## 2012-01-23 DIAGNOSIS — R222 Localized swelling, mass and lump, trunk: Secondary | ICD-10-CM | POA: Diagnosis present

## 2012-01-23 DIAGNOSIS — D62 Acute posthemorrhagic anemia: Secondary | ICD-10-CM | POA: Diagnosis not present

## 2012-01-23 DIAGNOSIS — D150 Benign neoplasm of thymus: Principal | ICD-10-CM | POA: Diagnosis present

## 2012-01-23 DIAGNOSIS — D4989 Neoplasm of unspecified behavior of other specified sites: Secondary | ICD-10-CM | POA: Diagnosis present

## 2012-01-23 DIAGNOSIS — E7800 Pure hypercholesterolemia, unspecified: Secondary | ICD-10-CM | POA: Diagnosis present

## 2012-01-23 DIAGNOSIS — K5900 Constipation, unspecified: Secondary | ICD-10-CM | POA: Diagnosis present

## 2012-01-23 DIAGNOSIS — Z794 Long term (current) use of insulin: Secondary | ICD-10-CM

## 2012-01-23 DIAGNOSIS — C37 Malignant neoplasm of thymus: Secondary | ICD-10-CM

## 2012-01-23 DIAGNOSIS — Z87891 Personal history of nicotine dependence: Secondary | ICD-10-CM

## 2012-01-23 HISTORY — DX: Benign neoplasm of thymus: D15.0

## 2012-01-23 HISTORY — PX: WEDGE RESECTION: SHX5070

## 2012-01-23 HISTORY — PX: MEDIASTERNOTOMY: SHX5084

## 2012-01-23 HISTORY — DX: Neoplasm of unspecified behavior of other specified sites: D49.89

## 2012-01-23 LAB — GLUCOSE, CAPILLARY
Glucose-Capillary: 115 mg/dL — ABNORMAL HIGH (ref 70–99)
Glucose-Capillary: 175 mg/dL — ABNORMAL HIGH (ref 70–99)

## 2012-01-23 SURGERY — THYMECTOMY
Anesthesia: General | Site: Chest | Wound class: Clean

## 2012-01-23 MED ORDER — HYDROMORPHONE HCL PF 1 MG/ML IJ SOLN
0.2500 mg | INTRAMUSCULAR | Status: DC | PRN
  Administered 2012-01-23 (×2): 0.5 mg via INTRAVENOUS

## 2012-01-23 MED ORDER — LACTATED RINGERS IV SOLN
INTRAVENOUS | Status: DC | PRN
  Administered 2012-01-23 (×2): via INTRAVENOUS

## 2012-01-23 MED ORDER — OXYCODONE HCL 5 MG PO TABS
5.0000 mg | ORAL_TABLET | ORAL | Status: AC | PRN
  Administered 2012-01-23 – 2012-01-24 (×2): 5 mg via ORAL
  Administered 2012-01-24: 10 mg via ORAL
  Administered 2012-01-24: 5 mg via ORAL
  Filled 2012-01-23 (×3): qty 1
  Filled 2012-01-23: qty 2

## 2012-01-23 MED ORDER — PROPOFOL 10 MG/ML IV EMUL
INTRAVENOUS | Status: DC | PRN
  Administered 2012-01-23: 130 mg via INTRAVENOUS

## 2012-01-23 MED ORDER — BISACODYL 5 MG PO TBEC
10.0000 mg | DELAYED_RELEASE_TABLET | Freq: Every day | ORAL | Status: DC
  Administered 2012-01-24 – 2012-01-29 (×5): 10 mg via ORAL
  Filled 2012-01-23 (×5): qty 2

## 2012-01-23 MED ORDER — ACETAMINOPHEN 10 MG/ML IV SOLN
1000.0000 mg | Freq: Four times a day (QID) | INTRAVENOUS | Status: AC
  Administered 2012-01-23 – 2012-01-24 (×4): 1000 mg via INTRAVENOUS
  Filled 2012-01-23 (×4): qty 100

## 2012-01-23 MED ORDER — INSULIN ASPART 100 UNIT/ML ~~LOC~~ SOLN
0.0000 [IU] | SUBCUTANEOUS | Status: DC
  Administered 2012-01-23 – 2012-01-24 (×3): 4 [IU] via SUBCUTANEOUS
  Administered 2012-01-24: 2 [IU] via SUBCUTANEOUS
  Administered 2012-01-24: 4 [IU] via SUBCUTANEOUS

## 2012-01-23 MED ORDER — SODIUM CHLORIDE 0.9 % IJ SOLN
OROMUCOSAL | Status: DC | PRN
  Administered 2012-01-23: 09:00:00 via TOPICAL

## 2012-01-23 MED ORDER — ATORVASTATIN CALCIUM 20 MG PO TABS
20.0000 mg | ORAL_TABLET | Freq: Every day | ORAL | Status: DC
  Filled 2012-01-23 (×3): qty 1

## 2012-01-23 MED ORDER — FENTANYL CITRATE 0.05 MG/ML IJ SOLN
INTRAMUSCULAR | Status: AC
  Administered 2012-01-23: 50 ug via INTRAVENOUS
  Filled 2012-01-23: qty 2

## 2012-01-23 MED ORDER — NEOSTIGMINE METHYLSULFATE 1 MG/ML IJ SOLN
INTRAMUSCULAR | Status: DC | PRN
  Administered 2012-01-23: 3 mg via INTRAVENOUS

## 2012-01-23 MED ORDER — POTASSIUM CHLORIDE 10 MEQ/50ML IV SOLN
10.0000 meq | Freq: Every day | INTRAVENOUS | Status: DC | PRN
  Filled 2012-01-23: qty 50

## 2012-01-23 MED ORDER — GLYCOPYRROLATE 0.2 MG/ML IJ SOLN
INTRAMUSCULAR | Status: DC | PRN
  Administered 2012-01-23: .5 mg via INTRAVENOUS

## 2012-01-23 MED ORDER — MIDAZOLAM HCL 5 MG/5ML IJ SOLN
INTRAMUSCULAR | Status: DC | PRN
  Administered 2012-01-23: 1 mg via INTRAVENOUS

## 2012-01-23 MED ORDER — PHENYLEPHRINE HCL 10 MG/ML IJ SOLN
10.0000 mg | INTRAVENOUS | Status: DC | PRN
  Administered 2012-01-23: 50 ug/min via INTRAVENOUS

## 2012-01-23 MED ORDER — OXYCODONE-ACETAMINOPHEN 5-325 MG PO TABS
1.0000 | ORAL_TABLET | ORAL | Status: DC | PRN
  Administered 2012-01-23 – 2012-01-28 (×3): 2 via ORAL
  Filled 2012-01-23: qty 1
  Filled 2012-01-23: qty 2

## 2012-01-23 MED ORDER — SENNOSIDES-DOCUSATE SODIUM 8.6-50 MG PO TABS
1.0000 | ORAL_TABLET | Freq: Every evening | ORAL | Status: DC | PRN
  Administered 2012-01-24 – 2012-01-28 (×3): 1 via ORAL
  Filled 2012-01-23 (×2): qty 1

## 2012-01-23 MED ORDER — OXYCODONE-ACETAMINOPHEN 5-325 MG PO TABS
1.0000 | ORAL_TABLET | ORAL | Status: DC | PRN
  Administered 2012-01-24: 1 via ORAL
  Administered 2012-01-25: 2 via ORAL
  Filled 2012-01-23 (×2): qty 2
  Filled 2012-01-23: qty 1
  Filled 2012-01-23: qty 2

## 2012-01-23 MED ORDER — ONDANSETRON HCL 4 MG/2ML IJ SOLN
INTRAMUSCULAR | Status: DC | PRN
  Administered 2012-01-23: 4 mg via INTRAVENOUS

## 2012-01-23 MED ORDER — FENTANYL CITRATE 0.05 MG/ML IJ SOLN
25.0000 ug | INTRAMUSCULAR | Status: DC | PRN
  Administered 2012-01-23 – 2012-01-24 (×7): 50 ug via INTRAVENOUS
  Filled 2012-01-23 (×5): qty 2

## 2012-01-23 MED ORDER — LACTATED RINGERS IV SOLN
INTRAVENOUS | Status: DC | PRN
  Administered 2012-01-23: 08:00:00 via INTRAVENOUS

## 2012-01-23 MED ORDER — POTASSIUM CHLORIDE IN NACL 20-0.9 MEQ/L-% IV SOLN
INTRAVENOUS | Status: DC
  Administered 2012-01-23: 50 mL/h via INTRAVENOUS
  Administered 2012-01-24: 20 mL/h via INTRAVENOUS
  Filled 2012-01-23 (×2): qty 1000

## 2012-01-23 MED ORDER — SUFENTANIL CITRATE 50 MCG/ML IV SOLN
INTRAVENOUS | Status: DC | PRN
  Administered 2012-01-23 (×2): 5 ug via INTRAVENOUS
  Administered 2012-01-23: 20 ug via INTRAVENOUS
  Administered 2012-01-23 (×4): 10 ug via INTRAVENOUS

## 2012-01-23 MED ORDER — DEXTROSE 5 % IV SOLN
1.5000 g | Freq: Two times a day (BID) | INTRAVENOUS | Status: AC
  Administered 2012-01-23 – 2012-01-24 (×2): 1.5 g via INTRAVENOUS
  Filled 2012-01-23 (×2): qty 1.5

## 2012-01-23 MED ORDER — 0.9 % SODIUM CHLORIDE (POUR BTL) OPTIME
TOPICAL | Status: DC | PRN
  Administered 2012-01-23: 2000 mL

## 2012-01-23 MED ORDER — VANCOMYCIN HCL IN DEXTROSE 1-5 GM/200ML-% IV SOLN
1000.0000 mg | Freq: Two times a day (BID) | INTRAVENOUS | Status: AC
  Administered 2012-01-23: 1000 mg via INTRAVENOUS
  Filled 2012-01-23: qty 200

## 2012-01-23 MED ORDER — TRAMADOL HCL 50 MG PO TABS
50.0000 mg | ORAL_TABLET | Freq: Four times a day (QID) | ORAL | Status: DC | PRN
  Administered 2012-01-24 – 2012-01-25 (×3): 50 mg via ORAL
  Filled 2012-01-23: qty 1
  Filled 2012-01-23: qty 2
  Filled 2012-01-23: qty 1

## 2012-01-23 MED ORDER — ONDANSETRON HCL 4 MG/2ML IJ SOLN: 4.0000 mg | Freq: Four times a day (QID) | INTRAMUSCULAR | Status: DC | PRN

## 2012-01-23 MED ORDER — ONDANSETRON HCL 4 MG/2ML IJ SOLN: 4.0000 mg | Freq: Once | INTRAMUSCULAR | Status: DC | PRN

## 2012-01-23 MED ORDER — VECURONIUM BROMIDE 10 MG IV SOLR
INTRAVENOUS | Status: DC | PRN
  Administered 2012-01-23: 1 mg via INTRAVENOUS
  Administered 2012-01-23: 7 mg via INTRAVENOUS

## 2012-01-23 SURGICAL SUPPLY — 67 items
APPLIER CLIP 9.375 SM OPEN (CLIP) ×3
APR CLP SM 9.3 20 MLT OPN (CLIP) ×2
BLADE CORE FAN STRYKER (BLADE) ×1 IMPLANT
BLADE STERNUM SYSTEM 6 (BLADE) ×1 IMPLANT
BLADE SURG 11 STRL SS (BLADE) IMPLANT
CANISTER SUCTION 2500CC (MISCELLANEOUS) ×3 IMPLANT
CATH THORACIC 28FR (CATHETERS) IMPLANT
CATH THORACIC 28FR RT ANG (CATHETERS) IMPLANT
CATH THORACIC 36FR (CATHETERS) IMPLANT
CATH THORACIC 36FR RT ANG (CATHETERS) IMPLANT
CLIP APPLIE 9.375 SM OPEN (CLIP) IMPLANT
CLIP TI MEDIUM 24 (CLIP) ×3 IMPLANT
CLIP TI WIDE RED SMALL 24 (CLIP) ×3 IMPLANT
CLOTH BEACON ORANGE TIMEOUT ST (SAFETY) ×3 IMPLANT
CONN ST 1/4X3/8  BEN (MISCELLANEOUS) ×2
CONN ST 1/4X3/8 BEN (MISCELLANEOUS) IMPLANT
CONT SPEC 4OZ CLIKSEAL STRL BL (MISCELLANEOUS) ×4 IMPLANT
CORDS BIPOLAR (ELECTRODE) ×1 IMPLANT
COVER SURGICAL LIGHT HANDLE (MISCELLANEOUS) ×6 IMPLANT
DRAIN CHANNEL 28F RND 3/8 FF (WOUND CARE) ×2 IMPLANT
DRAPE LAPAROSCOPIC ABDOMINAL (DRAPES) ×3 IMPLANT
DRSG COVADERM 4X14 (GAUZE/BANDAGES/DRESSINGS) ×1 IMPLANT
ELECT BLADE 4.0 EZ CLEAN MEGAD (MISCELLANEOUS) ×3
ELECT REM PT RETURN 9FT ADLT (ELECTROSURGICAL) ×3
ELECTRODE BLDE 4.0 EZ CLN MEGD (MISCELLANEOUS) IMPLANT
ELECTRODE REM PT RTRN 9FT ADLT (ELECTROSURGICAL) ×2 IMPLANT
GLOVE BIOGEL PI IND STRL 7.5 (GLOVE) IMPLANT
GLOVE BIOGEL PI INDICATOR 7.5 (GLOVE) ×1
GLOVE BIOGEL PI ORTHO PRO 7.5 (GLOVE) ×2
GLOVE PI ORTHO PRO STRL 7.5 (GLOVE) IMPLANT
GOWN PREVENTION PLUS XLARGE (GOWN DISPOSABLE) ×1 IMPLANT
GOWN STRL NON-REIN LRG LVL3 (GOWN DISPOSABLE) ×9 IMPLANT
GRAFT PTCH CORMATRIX 7X10 4PLY (Prosthesis & Implant Heart) ×1 IMPLANT
HANDLE STAPLE ENDO GIA SHORT (STAPLE) ×1
HEMOSTAT POWDER SURGIFOAM 1G (HEMOSTASIS) ×6 IMPLANT
KIT BASIN OR (CUSTOM PROCEDURE TRAY) ×3 IMPLANT
KIT PLEURAL DRAIN ASPIRA BL (MISCELLANEOUS) ×1 IMPLANT
KIT ROOM TURNOVER OR (KITS) ×3 IMPLANT
KIT SUCTION CATH 14FR (SUCTIONS) IMPLANT
NS IRRIG 1000ML POUR BTL (IV SOLUTION) ×6 IMPLANT
PACK CHEST (CUSTOM PROCEDURE TRAY) ×3 IMPLANT
PAD ARMBOARD 7.5X6 YLW CONV (MISCELLANEOUS) ×6 IMPLANT
RELOAD EGIA 45 MED/THCK PURPLE (STAPLE) ×3 IMPLANT
SPONGE GAUZE 4X4 12PLY (GAUZE/BANDAGES/DRESSINGS) ×3 IMPLANT
STAPLER ENDO GIA 12 SHRT THIN (STAPLE) IMPLANT
STAPLER ENDO GIA 12MM SHORT (STAPLE) ×2 IMPLANT
SUT MNCRL AB 3-0 PS2 18 (SUTURE) ×2 IMPLANT
SUT PDS AB 1 CTX 36 (SUTURE) ×3 IMPLANT
SUT PROLENE 4 0 RB 1 (SUTURE) ×3
SUT PROLENE 4-0 RB1 .5 CRCL 36 (SUTURE) IMPLANT
SUT SILK  1 MH (SUTURE) ×3
SUT SILK 1 MH (SUTURE) IMPLANT
SUT SILK 2 0 SH CR/8 (SUTURE) ×3 IMPLANT
SUT STEEL 6MS V (SUTURE) ×2 IMPLANT
SUT STEEL STERNAL CCS#1 18IN (SUTURE) IMPLANT
SUT STEEL SZ 6 DBL 3X14 BALL (SUTURE) ×2 IMPLANT
SUT VIC AB 1 CTX 18 (SUTURE) ×1 IMPLANT
SUT VIC AB 1 CTX 27 (SUTURE) ×7 IMPLANT
SUT VIC AB 2-0 CTX 36 (SUTURE) ×8 IMPLANT
SUT VIC AB 3-0 SH 8-18 (SUTURE) ×2 IMPLANT
SUT VIC AB 3-0 X1 27 (SUTURE) ×6 IMPLANT
SYSTEM SAHARA CHEST DRAIN RE-I (WOUND CARE) ×3 IMPLANT
TAPE CLOTH SURG 4X10 WHT LF (GAUZE/BANDAGES/DRESSINGS) ×1 IMPLANT
TOWEL OR 17X24 6PK STRL BLUE (TOWEL DISPOSABLE) ×5 IMPLANT
TOWEL OR 17X26 10 PK STRL BLUE (TOWEL DISPOSABLE) ×6 IMPLANT
TRAY FOLEY CATH 14FRSI W/METER (CATHETERS) ×3 IMPLANT
WATER STERILE IRR 1000ML POUR (IV SOLUTION) ×3 IMPLANT

## 2012-01-23 NOTE — Interval H&P Note (Signed)
History and Physical Interval Note:  01/23/2012 8:44 AM  Samantha [name]  has presented today for surgery, with the diagnosis of THYMOMA  The various methods of treatment have been discussed with the patient and family. After consideration of risks, benefits and other options for treatment, the patient has consented to  Procedure(s) (LRB): THYMECTOMY (N/A) PARTIAL MEDIASTERNOTOMY (N/A) as a surgical intervention .  The patients' history has been reviewed, patient examined, no change in status, stable for surgery.  I have reviewed the patients' chart and labs.  Questions were answered to the patient's satisfaction.     Kaya Pottenger H

## 2012-01-23 NOTE — Transfer of Care (Signed)
Immediate Anesthesia Transfer of Care Note  Patient: Samantha [name]  Procedure(s) Performed: Procedure(s) (LRB): THYMECTOMY (N/A) PARTIAL MEDIASTERNOTOMY (N/A) WEDGE RESECTION ()  Patient Location: PACU  Anesthesia Type: General  Level of Consciousness: awake, alert  and oriented  Airway & Oxygen Therapy: Patient Spontanous Breathing and Patient connected to nasal cannula oxygen  Post-op Assessment: Report given to PACU RN, Post -op Vital signs reviewed and stable and Patient moving all extremities  Post vital signs: Reviewed and stable  Complications: No apparent anesthesia complications

## 2012-01-23 NOTE — Preoperative (Signed)
Beta Blockers   Reason not to administer Beta Blockers:Not Applicable 

## 2012-01-23 NOTE — Anesthesia Preprocedure Evaluation (Addendum)
Anesthesia Evaluation  Patient identified by MRN, date of birth, ID band Patient awake    Reviewed: Allergy & Precautions, H&P , NPO status , Patient's Chart, lab work & pertinent test results  Airway Mallampati: II TM Distance: >3 FB Neck ROM: Full    Dental  (+) Edentulous Upper and Edentulous Lower   Pulmonary  breath sounds clear to auscultation        Cardiovascular Rhythm:Regular Rate:Normal     Neuro/Psych    GI/Hepatic   Endo/Other    Renal/GU      Musculoskeletal   Abdominal   Peds  Hematology   Anesthesia Other Findings   Reproductive/Obstetrics                          Anesthesia Physical Anesthesia Plan  ASA: III  Anesthesia Plan: General   Post-op Pain Management:    Induction: Intravenous  Airway Management Planned: Oral ETT  Additional Equipment: Arterial line and CVP  Intra-op Plan:   Post-operative Plan:   Informed Consent: I have reviewed the patients History and Physical, chart, labs and discussed the procedure including the risks, benefits and alternatives for the proposed anesthesia with the patient or authorized representative who has indicated his/her understanding and acceptance.   Dental advisory given  Plan Discussed with: CRNA and Anesthesiologist  Anesthesia Plan Comments:        Anesthesia Quick Evaluation

## 2012-01-23 NOTE — Brief Op Note (Signed)
01/23/2012  11:00 AM  PATIENT:  Sarajane Marek  66 y.o. female  PRE-OPERATIVE DIAGNOSIS:  THYMOMA  POST-OPERATIVE DIAGNOSIS:  THYMOMA  PROCEDURE:  Procedure(s) (LRB): THYMECTOMY (N/A) PARTIAL MEDIAN STERNOTOMY (N/A) WEDGE RESECTION (Left Upper Lobe) COR-MATRIX PATCH RECONSTRUCTION OF PERICARDIUM  SURGEON:    Purcell Nails, MD  ASSISTANTS:  Gershon Crane, PA-C  ANESTHESIA:   Kipp Brood, MD  FINDINGS:  Firm mass in anterior mediastinum c/w thymoma on frozen section  Mass densely adherent to and/or invading pericardium, left pleura and left upper lobe  SPECIMENS:  Thymus with adjacent tissues including pericardium, pleura and wedge resection left upper lobe  Anterior mediastinal (periaortic) lymph node  COMPLICATIONS: none  PATIENT DISPOSITION:   TO PACU IN STABLE CONDITION  Cornelius Marullo H 01/23/2012 11:01 AM

## 2012-01-23 NOTE — OR Nursing (Signed)
10:06am - frozen specimen received by lab.

## 2012-01-23 NOTE — Anesthesia Postprocedure Evaluation (Signed)
  Anesthesia Post-op Note  Patient: Samantha [name]  Procedure(s) Performed: Procedure(s) (LRB): THYMECTOMY (N/A) PARTIAL MEDIASTERNOTOMY (N/A) WEDGE RESECTION ()  Patient Location: PACU  Anesthesia Type: General  Level of Consciousness: awake, alert  and oriented  Airway and Oxygen Therapy: Patient Spontanous Breathing and Patient connected to nasal cannula oxygen  Post-op Pain: mild  Post-op Assessment: Post-op Vital signs reviewed and Patient's Cardiovascular Status Stable  Post-op Vital Signs: stable  Complications: No apparent anesthesia complications

## 2012-01-23 NOTE — Op Note (Signed)
CARDIOTHORACIC SURGERY OPERATIVE NOTE  Date of Procedure: 01/23/2012  Preoperative Diagnosis: Thymoma  Postoperative Diagnosis: Thymoma  Procedure:    Partial Upper Sternotomy  Thymectomy  Wedge Resection Left Upper Lobe  Cor-matrix Patch Reconstruction of the Pericardium  Surgeon: Salvatore Decent. Cornelius Moras, MD  Assistant: Gershon Crane, PA-C  Anesthesia: Kipp Brood, MD  Operative Findings:  Anterior mediastinal mass c/w thymoma on preoperative and intraoperative biopsy  Anterior mediastinal mass densely adherent to and/or directly invading the anterior pericardium, left pleura, and medial surface of left upper lobe    BRIEF CLINICAL NOTE AND INDICATIONS FOR SURGERY  Patient is a 66 year old morbidly obese African American female with poorly controlled type 2 diabetes mellitus hypertension and recurrent palpitations who was recently hospitalized at [facility] for symptoms of palpitations. 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  I saw the patient in consultation on 10/19/2011.  Thyroid hormone levels were normal, as were quantitative beta HCG and serum alfa fetoprotein levels.  We discussed possible options for further evaluation and treatment, including surgical resection versus PET/CT scan with continued follow up if the lesion was not hypermetabolic on PET in an effort to avoid the risks of surgery.  A time the patient was not interested in considering surgery so we obtained a PET/CT scan that demonstrated hypermetabolic activity in the mass and also raised suspicion regarding the possibility of metastatic disease to her cervical spine.  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  The patient has been cleared  for elective surgery by her cardiologist, Dr Eden Emms.  She now presents for elective resection of her anterior mediastinal mass.    DETAILS OF THE OPERATIVE PROCEDURE  The patient is brought to the operating room on the above-mentioned date. Central venous catheter and radial arterial line are placed by the anesthesia team. General endotracheal anesthesia is induced uneventfully. A Foley catheter is placed. Pneumatic sequential compression boots are placed on both lower extremities. Intravenous antibiotics are administered.  The patient's anterior chest and abdomen are prepared and draped in a sterile manner.  A time out procedure is performed.  A partial upper miniature sternotomy is performed. The incision was begun at the sternal notch and extended to the level of the third intercostal space. The sternum is divided in the midline from the sternal notch and wide into the left third intercostal space. A retractor is placed. There is an obvious firm mass located towards the left side in the anterior mediastinum consistent with the preoperative diagnosis of thymoma. Dissection is begun on the right side and all of the soft tissues in the anterior mediastinum are dissected away from the right pleural surface. Dissection is then continued superiorly beginning well above the level of the innominate vein and all soft tissues are swept inferiorly. Once the dissection reaches just below the level of the innominate vein, dissection is then begun lifting all of the tissues from the anterior mediastinum off of the underlying pericardial surface. This continued until the dissection approaches the level of the firm mass located to the left of midline. Inferiorly dissection is continued well below the level of the mass and this is continued backwards until the mass was reached. At this level mass appears to be densely adherent to and/or directly invading the  anterior surface of the pericardium directly anterior to level  of the pulmonary artery. Dissection is stopped in this location now continued on left side superiorly. Again tissues are swept from superior to inferior. It becomes clear that the mass is either directly invading or adherent to the left pleural surface, and subsequently the left pleural space is entered. Upon entry into the pleural space one can appreciate that the medial surface of the left upper lobe is also directly adherent to the mass and/or the mass was directly invading. It was postulated that this could be related to adhesions from the patient's previous needle biopsy  However, the degree of dense attachment seems too great and is therefore felt more suspicious for direct invasion. Therefore, wedge resection of this portion of the medial aspect of the left upper lobe was performed using endoscopic GIA stapling device. Once this has been completed the remainder of the resection is easily performed as the inferior border of the mass is clearly identified, notably well anterior to the phrenic nerve. The pericardial sac was entered and a circular portion of the pericardium is resected with the mass. On the undersurface of the pericardium there does not appear to be invasion through into the pericardial sac and there is no pericardial effusion. During the dissection a single periaortic lymph node is encountered well away from the mass. This is sent as a separate specimen to pathology. Preliminary frozen section histology of the lymph node is benign. Preliminary frozen section histology of the mass is consistent with thymoma.  The left pleural space and the pericardial sac are both drained using 28 French Bard chest tubes. The circular defect in the paracardial is reconstructed using a patch of core matrix. The miniature sternotomy is closed using double strength sternal wire. The soft tissues anterior to the sternum were closed in multiple layers and the skin is closed using a running subcuticular skin  closure.  The patient tolerated the procedure well, is extubated in the operating room and transported to the post anesthesia care in stable condition. There are no intraoperative complications. All sponge instrument and needle counts are verified correct at completion of the operation.    Salvatore Decent. Cornelius Moras MD 01/23/2012 11:49 AM

## 2012-01-23 NOTE — Care Management Note (Unsigned)
    Page 1 of 1   01/23/2012     3:53:05 PM   CARE MANAGEMENT NOTE 01/23/2012  Patient:  Samantha [name], Samantha [name]   Account Number:  [id number]  Date Initiated:  01/23/2012  Documentation initiated by:  Ashanti Ratti  Subjective/Objective Assessment:   PT S/P THYMECTOMY, PARTIAL STERNOTOMY ON 01/23/12.  PTA, PT INDEPENDENT, LIVES ALONE.     Action/Plan:   PT AGREEABLE TO SHORT TERM SNF AT DC IF NEEDED.  SHE HAS DAUGHTER TO ASSIST AT DC, BUT SHE WORKS.  WILL CONSULT CSW FOR POSSIBLE ST-SNF; WILL FOLLOW.   Anticipated DC Date:  01/27/2012   Anticipated DC Plan:  SKILLED NURSING FACILITY  In-house referral  Clinical Social Worker      DC Planning Services  CM consult      Choice offered to / List presented to:             Status of service:  In process, will continue to follow Medicare Important Message given?   (If response is "NO", the following Medicare IM given date fields will be blank) Date Medicare IM given:   Date Additional Medicare IM given:    Discharge Disposition:    Per UR Regulation:    If discussed at Long Length of Stay Meetings, dates discussed:    CommentsRosalita Chessman   [phone]

## 2012-01-23 NOTE — Progress Notes (Signed)
Filed Vitals:   01/23/12 1400 01/23/12 1500 01/23/12 1547 01/23/12 1824  BP: 121/52 132/58  132/58  Pulse: 109 120  120  Temp: 98.5 F (36.9 C)  98.5 F (36.9 C) 98.5 F (36.9 C)  TempSrc: Axillary  Oral Oral  Resp: 20 28  28   Height:    5' 2.5" (1.588 m)  Weight:    104.3 kg (229 lb 15 oz)  SpO2: 96% 100%  100%    Awake and alert CT output low Urine output ok

## 2012-01-24 ENCOUNTER — Inpatient Hospital Stay (HOSPITAL_COMMUNITY): Payer: Self-pay

## 2012-01-24 ENCOUNTER — Encounter (HOSPITAL_COMMUNITY): Payer: Self-pay | Admitting: Thoracic Surgery (Cardiothoracic Vascular Surgery)

## 2012-01-24 LAB — CBC
HCT: 38.4 % (ref 36.0–46.0)
Hemoglobin: 12.1 g/dL (ref 12.0–15.0)
MCH: 28.6 pg (ref 26.0–34.0)
MCHC: 31.5 g/dL (ref 30.0–36.0)

## 2012-01-24 LAB — GLUCOSE, CAPILLARY
Glucose-Capillary: 106 mg/dL — ABNORMAL HIGH (ref 70–99)
Glucose-Capillary: 198 mg/dL — ABNORMAL HIGH (ref 70–99)

## 2012-01-24 LAB — BASIC METABOLIC PANEL
BUN: 17 mg/dL (ref 6–23)
GFR calc non Af Amer: 74 mL/min — ABNORMAL LOW (ref >90)
Glucose, Bld: 92 mg/dL (ref 70–99)
Potassium: 4.4 mEq/L (ref 3.5–5.1)

## 2012-01-24 LAB — POCT I-STAT 3, ART BLOOD GAS (G3+)
Acid-base deficit: 1 mmol/L (ref 0.0–2.0)
Bicarbonate: 25.6 mEq/L — ABNORMAL HIGH (ref 20.0–24.0)
pO2, Arterial: 118 mmHg — ABNORMAL HIGH (ref 80.0–100.0)

## 2012-01-24 MED ORDER — GLIPIZIDE 10 MG PO TABS
10.0000 mg | ORAL_TABLET | Freq: Every day | ORAL | Status: DC
  Administered 2012-01-25 – 2012-01-29 (×5): 10 mg via ORAL
  Filled 2012-01-24 (×6): qty 1

## 2012-01-24 MED ORDER — INSULIN ASPART 100 UNIT/ML ~~LOC~~ SOLN
0.0000 [IU] | SUBCUTANEOUS | Status: DC
  Administered 2012-01-24: 4 [IU] via SUBCUTANEOUS

## 2012-01-24 MED ORDER — METFORMIN HCL 500 MG PO TABS
1000.0000 mg | ORAL_TABLET | Freq: Two times a day (BID) | ORAL | Status: DC
  Administered 2012-01-25 – 2012-01-29 (×9): 1000 mg via ORAL
  Filled 2012-01-24 (×11): qty 2

## 2012-01-24 MED ORDER — ROSUVASTATIN CALCIUM 10 MG PO TABS
10.0000 mg | ORAL_TABLET | Freq: Every day | ORAL | Status: DC
  Administered 2012-01-24 – 2012-01-28 (×5): 10 mg via ORAL
  Filled 2012-01-24 (×6): qty 1

## 2012-01-24 MED ORDER — MUPIROCIN 2 % EX OINT
1.0000 "application " | TOPICAL_OINTMENT | Freq: Two times a day (BID) | CUTANEOUS | Status: AC
  Administered 2012-01-24 – 2012-01-27 (×7): 1 via NASAL
  Filled 2012-01-24: qty 22

## 2012-01-24 MED ORDER — CHLORHEXIDINE GLUCONATE CLOTH 2 % EX PADS
6.0000 | MEDICATED_PAD | Freq: Every day | CUTANEOUS | Status: AC
  Administered 2012-01-24 – 2012-01-26 (×3): 6 via TOPICAL

## 2012-01-24 MED ORDER — INSULIN ASPART 100 UNIT/ML ~~LOC~~ SOLN
0.0000 [IU] | Freq: Three times a day (TID) | SUBCUTANEOUS | Status: DC
  Administered 2012-01-25: 2 [IU] via SUBCUTANEOUS
  Administered 2012-01-25 – 2012-01-26 (×4): 4 [IU] via SUBCUTANEOUS
  Administered 2012-01-26 – 2012-01-27 (×4): 2 [IU] via SUBCUTANEOUS
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
## 2012-01-24 NOTE — Progress Notes (Addendum)
   CARDIOTHORACIC SURGERY PROGRESS NOTE  1 Day Post-Op  S/P Procedure(s) (LRB): THYMECTOMY (N/A) PARTIAL MEDIASTERNOTOMY (N/A) WEDGE RESECTION ()  Subjective: Doing very well.  Mild soreness.  Objective: Vital signs in last 24 hours: Temp:  [96.8 F (36 C)-98.5 F (36.9 C)] 98.3 F (36.8 C) (05/16 0400) Pulse Rate:  [81-120] 106  (05/16 0700) Cardiac Rhythm:  [-] Sinus tachycardia (05/16 0400) Resp:  [8-33] 27  (05/16 0700) BP: (112-161)/(52-78) 115/64 mmHg (05/16 0700) SpO2:  [93 %-100 %] 100 % (05/16 0700) Arterial Line BP: (97-172)/(46-80) 129/58 mmHg (05/16 0700) FiO2 (%):  [35 %] 35 % (05/15 1824) Weight:  [102.4 kg (225 lb 12 oz)-104.3 kg (229 lb 15 oz)] 102.4 kg (225 lb 12 oz) (05/16 0600)  Physical Exam:  Rhythm:   sinus  Breath sounds: clear  Heart sounds:  RRR  Incisions:  Dressings dry  Abdomen:  soft  Extremities:  warm  Chest tube(s):  No air leak, minimal output   Intake/Output from previous day: 05/15 0701 - 05/16 0700 In: 3410 [P.O.:610; I.V.:2250; IV Piggyback:550] Out: 1130 [Urine:670; Blood:75; Chest Tube:385] Intake/Output this shift:    Lab Results:  Basename 01/24/12 0400 01/21/12 1136  WBC 18.7* 8.4  HGB 12.1 12.7  HCT 38.4 38.2  PLT 302 295   BMET:  Basename 01/24/12 0400 01/21/12 1136  NA 140 138  K 4.4 4.2  CL 105 104  CO2 24 21  GLUCOSE 92 170*  BUN 17 15  CREATININE 0.82 0.61  CALCIUM 8.8 10.0    CBG (last 3)   Basename 01/24/12 0400 01/23/12 2351 01/23/12 2028  GLUCAP 106* 186* 166*    CXR:  Looks good  Assessment/Plan: S/P Procedure(s) (LRB): THYMECTOMY (N/A) PARTIAL MEDIASTERNOTOMY (N/A) WEDGE RESECTION ()  Doing well POD1 Mobilize Transfer 3300 Chest tubes to water seal Continue SS insulin, watch CBGs and restart oral agents tomorrow  Purcell Nails 01/24/2012 7:39 AM

## 2012-01-24 NOTE — Progress Notes (Signed)
Status post thymectomy Sinus rhythm saturation 96% Scattered rales on exam No urine output 9 hours after removal of Foley catheter, bladder scan difficult to interpret, will replace Foley Neuro intact

## 2012-01-24 NOTE — Progress Notes (Addendum)
Clinical Social Work Department BRIEF PSYCHOSOCIAL ASSESSMENT 01/24/2012  Patient:  Samantha [name], Samantha [name]     Account Number:  [id number]     Admit date:  01/23/2012  Clinical Social Worker:  Mee Hives  Date/Time:  01/24/2012 11:00 AM  Referred by:  Care Management  Date Referred:  01/23/2012 Referred for  SNF Placement   Other Referral:   Interview type:  Patient Other interview type:    PSYCHOSOCIAL DATA Living Status:  ALONE Admitted from facility:   Level of care:   Primary support name:  Glorious Peach Primary support relationship to patient:  CHILD, ADULT Degree of support available:   Moderate, pt daughter works but is able to check in with pt, per pt report.    CURRENT CONCERNS  Other Concerns:    SOCIAL WORK ASSESSMENT / PLAN CSW referred by [facility] with concerns that pt is deconditioned and may require SNF placement at time of d/c. Pt has only been OOB x1 since procedure and states she needed help. Pt reports she was independent with ADL's PTA. Pt agreeable to SNF placement if this is necessary.   Assessment/plan status:  Psychosocial Support/Ongoing Assessment of Needs Other assessment/ plan:   CSW referred to [facility] for PT order  CSW referred to financial counseling to assist with Medicaid application  CSW will follow up with pt daughter and will look for PT recommendations.   Information/referral to community resources:    PATIENT'S/FAMILY'S RESPONSE TO PLAN OF CARE: Pt receptive and appreciative of CSW intervention

## 2012-01-25 ENCOUNTER — Inpatient Hospital Stay (HOSPITAL_COMMUNITY): Payer: Self-pay

## 2012-01-25 LAB — COMPREHENSIVE METABOLIC PANEL
Albumin: 3 g/dL — ABNORMAL LOW (ref 3.5–5.2)
Alkaline Phosphatase: 72 U/L (ref 39–117)
BUN: 15 mg/dL (ref 6–23)
Chloride: 96 mEq/L (ref 96–112)
GFR calc Af Amer: >90 mL/min (ref >90)
Glucose, Bld: 119 mg/dL — ABNORMAL HIGH (ref 70–99)
Potassium: 4.6 mEq/L (ref 3.5–5.1)
Total Bilirubin: 0.6 mg/dL (ref 0.3–1.2)

## 2012-01-25 LAB — CBC
HCT: 35.7 % — ABNORMAL LOW (ref 36.0–46.0)
Hemoglobin: 11.7 g/dL — ABNORMAL LOW (ref 12.0–15.0)
WBC: 16.3 10*3/uL — ABNORMAL HIGH (ref 4.0–10.5)

## 2012-01-25 LAB — GLUCOSE, CAPILLARY
Glucose-Capillary: 194 mg/dL — ABNORMAL HIGH (ref 70–99)
Glucose-Capillary: 181 mg/dL — ABNORMAL HIGH (ref 70–99)

## 2012-01-25 NOTE — Progress Notes (Signed)
Physical Therapy Treatment Patient Details Name: Samantha [name] MRN: [id number] DOB: August 24, 1946 Today's Date: 01/25/2012 Time: 0865-7846 PT Time Calculation (min): 30 min  PT Assessment / Plan / Recommendation Comments on Treatment Session  Pt s/p thymectomy and wedge resection. CT pulled at beginning of session and pt able to mobilize with increased distance today. Pt educated for sternal precautions again with handout provided and cueing for breathing technique throughout. Pt encouraged to continue HEP and ambulation with nursing staff    Follow Up Recommendations       Barriers to Discharge        Equipment Recommendations       Recommendations for Other Services    Frequency     Plan Discharge plan remains appropriate;Frequency remains appropriate    Precautions / Restrictions Precautions Precautions: Sternal   Pertinent Vitals/Pain No pain reported Pt down to 88% on RA but maintained 94% on 4L with ambulation HR up to 135 with ambulation and 120 at rest    Mobility  Transfers Transfers: Sit to Stand;Stand to Sit Sit to Stand: 3: Mod assist;From chair/3-in-1 Stand to Sit: 4: Min assist;To chair/3-in-1 Details for Transfer Assistance: cueing for hand placement, anterior translation and assist to elevate sacrum from surface Ambulation/Gait Ambulation/Gait Assistance: 4: Min guard Ambulation Distance (Feet): 300 Feet Assistive device: Rolling walker Ambulation/Gait Assistance Details: cueing for posture and position in RW as well as breathing technique, HR up to 135 sinus tach with activity, RN aware Gait Pattern: Step-through pattern;Decreased stride length Stairs: No    Exercises General Exercises - Lower Extremity Long Arc Quad: AROM;Both;20 reps;Seated Hip Flexion/Marching: AROM;Both;20 reps;Seated   PT Diagnosis:    PT Problem List:   PT Treatment Interventions:     PT Goals Acute Rehab PT Goals PT Goal: Sit to Stand - Progress: Progressing toward  goal PT Goal: Stand to Sit - Progress: Progressing toward goal PT Goal: Ambulate - Progress: Progressing toward goal PT Goal: Up/Down Stairs - Progress: Progressing toward goal Additional Goals PT Goal: Additional Goal #1 - Progress: Progressing toward goal  Visit Information  Last PT Received On: 01/25/12 Assistance Needed: +1    Subjective Data  Subjective: oh, i don't remember   Cognition  Overall Cognitive Status: Appears within functional limits for tasks assessed/performed Arousal/Alertness: Awake/alert Orientation Level: Appears intact for tasks assessed Behavior During Session: [facility] for tasks performed    Balance     End of Session PT - End of Session Equipment Utilized During Treatment: Gait belt Activity Tolerance: Patient tolerated treatment well Patient left: in chair;with call bell/phone within reach Nurse Communication: Mobility status    Delorse Lek 01/25/2012, 10:24 AM Delaney Meigs, PT [phone]

## 2012-01-25 NOTE — Progress Notes (Signed)
CSW spoke with pt and with pt daughter regarding disposition. PT recommending SNF as pt is requiring assistance and does not have care at home during the daytime. CSW provided Medicaid application and will follow up with pt and daughter on Monday to review application and have it submitted.   CSW has initiated SNF search in [facility] and will continue to follow for d/c planning.   Baxter Flattery, MSW [phone]

## 2012-01-25 NOTE — Progress Notes (Signed)
Pt tx from 2300 at 1430 to 3311

## 2012-01-25 NOTE — Progress Notes (Signed)
Utilization review completed.  

## 2012-01-25 NOTE — Progress Notes (Signed)
This CSW handing off to [place], CSW for unit 3300 Baxter Flattery, MSW [phone]

## 2012-01-25 NOTE — Progress Notes (Signed)
   CARDIOTHORACIC SURGERY PROGRESS NOTE  2 Days Post-Op  S/P Procedure(s) (LRB): THYMECTOMY (N/A) PARTIAL MEDIASTERNOTOMY (N/A) WEDGE RESECTION ()  Subjective: No complaints.  Rested well last night.  Objective: Vital signs in last 24 hours: Temp:  [97.9 F (36.6 C)-98.8 F (37.1 C)] 98.6 F (37 C) (05/17 0400) Pulse Rate:  [103-141] 107  (05/17 0700) Cardiac Rhythm:  [-] Sinus tachycardia (05/17 0400) Resp:  [21-37] 22  (05/17 0700) BP: (98-130)/(53-85) 98/53 mmHg (05/17 0700) SpO2:  [90 %-100 %] 100 % (05/17 0700) Arterial Line BP: (137-142)/(60-98) 137/98 mmHg (05/16 1000) Weight:  [103.6 kg (228 lb 6.3 oz)] 103.6 kg (228 lb 6.3 oz) (05/17 0400)  Physical Exam:  Rhythm:   sinus  Breath sounds: Clear except few bibasilar crackles  Heart sounds:  RRR  Incisions:  Clean and dry  Abdomen:  soft  Extremities:  warm  Chest tube(s):  No air leak, minimal output   Intake/Output from previous day: 05/16 0701 - 05/17 0700 In: 1590 [P.O.:1080; I.V.:360; IV Piggyback:150] Out: 1540 [Urine:1490; Chest Tube:50] Intake/Output this shift:    Lab Results:  Basename 01/25/12 0400 01/24/12 0400  WBC 16.3* 18.7*  HGB 11.7* 12.1  HCT 35.7* 38.4  PLT 271 302   BMET:  Basename 01/25/12 0400 01/24/12 0400  NA 131* 140  K 4.6 4.4  CL 96 105  CO2 25 24  GLUCOSE 119* 92  BUN 15 17  CREATININE 0.75 0.82  CALCIUM 9.1 8.8    CBG (last 3)   Basename 01/24/12 2021 01/24/12 1537 01/24/12 1129  GLUCAP 198* 118* 162*    CXR:  Looks good  Assessment/Plan: S/P Procedure(s) (LRB): THYMECTOMY (N/A) PARTIAL MEDIASTERNOTOMY (N/A) WEDGE RESECTION ()  Doing well POD2 - still waiting for bed on step down unit for transfer Expected post op acute blood loss anemia, mild, stable Expected post op volume excess, mild Post op hyponatremia, likely due to volume excess Type II diabetes mellitus, glycemic control good   D/C chest tubes  Mobilize  D/C foley again  Path results  c/w benign thymoma although locally adherent to and/or invading the left upper lobe - discussed with patient - may need radiation therapy    Rayelle Armor H 01/25/2012 7:53 AM

## 2012-01-26 LAB — GLUCOSE, CAPILLARY
Glucose-Capillary: 132 mg/dL — ABNORMAL HIGH (ref 70–99)
Glucose-Capillary: 168 mg/dL — ABNORMAL HIGH (ref 70–99)

## 2012-01-26 MED ORDER — LACTULOSE 10 GM/15ML PO SOLN
20.0000 g | Freq: Once | ORAL | Status: AC
  Administered 2012-01-26: 20 g via ORAL
  Filled 2012-01-26: qty 30

## 2012-01-26 MED ORDER — ENOXAPARIN SODIUM 30 MG/0.3ML ~~LOC~~ SOLN
30.0000 mg | Freq: Every day | SUBCUTANEOUS | Status: DC
  Administered 2012-01-26 – 2012-01-28 (×3): 30 mg via SUBCUTANEOUS
  Filled 2012-01-26 (×4): qty 0.3

## 2012-01-26 MED ORDER — OXYCODONE-ACETAMINOPHEN 5-325 MG PO TABS: 1.0000 | ORAL_TABLET | ORAL | Status: AC | PRN

## 2012-01-26 MED ORDER — LORATADINE 10 MG PO TABS
10.0000 mg | ORAL_TABLET | Freq: Every day | ORAL | Status: DC
  Administered 2012-01-26 – 2012-01-29 (×4): 10 mg via ORAL
  Filled 2012-01-26 (×4): qty 1

## 2012-01-26 MED ORDER — ASPIRIN 81 MG PO CHEW
81.0000 mg | CHEWABLE_TABLET | Freq: Every day | ORAL | Status: DC
  Administered 2012-01-26 – 2012-01-29 (×4): 81 mg via ORAL
  Filled 2012-01-26 (×4): qty 1

## 2012-01-26 NOTE — Discharge Instructions (Signed)
Activity: 1.May walk up steps                2.No lifting more than ten pounds for four weeks.                 3.No driving for four weeks.                4.Stop any activity that causes chest pain, shortness of breath, dizziness, sweating or excessive weakness.                5.Avoid straining.                6.Continue with your breathing exercises daily.  Diet: Diabetic diet and Low fat diet  Wound Care: May shower.  Clean wounds with mild soap and water daily. Contact the office at [phone] if any problems arise.

## 2012-01-26 NOTE — Progress Notes (Signed)
Pt transferred to 2017 per MD order. Report called to receiving nurse and all questions answered.

## 2012-01-26 NOTE — Progress Notes (Addendum)
3 Days Post-Op Procedure(s) (LRB): THYMECTOMY (N/A) PARTIAL MEDIASTERNOTOMY (N/A) WEDGE RESECTION ()  Subjective: Patient without complaints except for constipation.  Objective: Vital signs in last 24 hours: Patient Vitals for the past 24 hrs:  BP Temp Temp src Pulse Resp SpO2 Weight  01/26/12 1205 128/57 mmHg 98 F (36.7 C) Oral 104  16  100 % -  01/26/12 0836 108/73 mmHg 98.5 F (36.9 C) Oral 113  24  99 % -  01/26/12 0420 128/53 mmHg 98.5 F (36.9 C) Oral 104  24  96 % -  01/26/12 0003 123/63 mmHg 98.2 F (36.8 C) Oral 110  22  97 % -  01/25/12 2000 134/97 mmHg 98.6 F (37 C) Oral 120  34  96 % -  01/25/12 1608 - 98.3 F (36.8 C) Oral - - - -  01/25/12 1600 103/88 mmHg - - 107  23  98 % -  01/25/12 1449 104/51 mmHg 97.6 F (36.4 C) Oral - - - 233 lb (105.688 kg)    Current Weight  01/25/12 233 lb (105.688 kg)      Intake/Output from previous day: 05/17 0701 - 05/18 0700 In: 800 [P.O.:720; I.V.:80] Out: 1830 [Urine:1830]   Physical Exam:  Cardiovascular: RRR. Pulmonary: Clear to auscultation bilaterally; no rales, wheezes, or rhonchi. Abdomen: Soft, non tender, bowel sounds present. Wound: Dressing is clean and dry.    Lab Results: CBC: Basename 01/25/12 0400 01/24/12 0400  WBC 16.3* 18.7*  HGB 11.7* 12.1  HCT 35.7* 38.4  PLT 271 302   BMET:  Basename 01/25/12 0400 01/24/12 0400  NA 131* 140  K 4.6 4.4  CL 96 105  CO2 25 24  GLUCOSE 119* 92  BUN 15 17  CREATININE 0.75 0.82  CALCIUM 9.1 8.8    PT/INR: No results found for this basename: LABPROT,INR in the last 72 hours ABG:  INR: Will add last result for INR, ABG once components are confirmed Will add last 4 CBG results once components are confirmed  Assessment/Plan: 1.Surgically stable s/p thymectomy and wedge resection of LUL. 2.DM-CBGs 184/132/168.Continue glipizide and Metformin. 3.Pulmonary-Wean O2.Encourage incentive spirometer. 4.LOC constipation. 5.Likely to SNF  Monday.  ZIMMERMAN,DONIELLE MPA-C 01/26/2012   I have seen and examined the patient and agree with the assessment and plan as outlined.  Purcell Nails 01/26/2012 4:47 PM

## 2012-01-26 NOTE — Progress Notes (Signed)
CSW spoke with Dtr and informed there are currently no bed offers.  CSW to f/u as bed offer become available. Milus Banister MSW,LCSW w/e Coverage [phone]

## 2012-01-27 LAB — GLUCOSE, CAPILLARY
Glucose-Capillary: 108 mg/dL — ABNORMAL HIGH (ref 70–99)
Glucose-Capillary: 110 mg/dL — ABNORMAL HIGH (ref 70–99)
Glucose-Capillary: 111 mg/dL — ABNORMAL HIGH (ref 70–99)

## 2012-01-27 MED ORDER — METOPROLOL TARTRATE 12.5 MG HALF TABLET: 12.5000 mg | ORAL_TABLET | Freq: Two times a day (BID) | ORAL | Status: DC

## 2012-01-27 MED ORDER — METOPROLOL TARTRATE 12.5 MG HALF TABLET
12.5000 mg | ORAL_TABLET | Freq: Two times a day (BID) | ORAL | Status: DC
  Administered 2012-01-27 (×2): 12.5 mg via ORAL
  Filled 2012-01-27 (×4): qty 1

## 2012-01-27 NOTE — Progress Notes (Addendum)
4 Days Post-Op Procedure(s) (LRB): THYMECTOMY (N/A) PARTIAL MEDIASTERNOTOMY (N/A) WEDGE RESECTION ()  Subjective: Patient had a bowel movement.  Objective: Vital signs in last 24 hours: Patient Vitals for the past 24 hrs:  BP Temp Temp src Pulse Resp SpO2  01/27/12 0449 129/74 mmHg 98.4 F (36.9 C) Oral 114  20  93 %  01/26/12 2005 109/82 mmHg 98.5 F (36.9 C) Oral 124  18  91 %  01/26/12 1857 148/70 mmHg 98.5 F (36.9 C) - 117  18  95 %  01/26/12 1723 129/67 mmHg 98.3 F (36.8 C) Oral 117  22  96 %  01/26/12 1205 128/57 mmHg 98 F (36.7 C) Oral 104  16  100 %    Current Weight  01/25/12 233 lb (105.688 kg)      Intake/Output from previous day: 05/18 0701 - 05/19 0700 In: 1320 [P.O.:1320] Out: 900 [Urine:900]   Physical Exam:  Cardiovascular: Tachycardic. Pulmonary: Clear to auscultation bilaterally; no rales, wheezes, or rhonchi. Abdomen: Soft, non tender, bowel sounds present. Wound: Dressing is clean and dry.    Lab Results: CBC:  Basename 01/25/12 0400  WBC 16.3*  HGB 11.7*  HCT 35.7*  PLT 271   BMET:   Basename 01/25/12 0400  NA 131*  K 4.6  CL 96  CO2 25  GLUCOSE 119*  BUN 15  CREATININE 0.75  CALCIUM 9.1    PT/INR: No results found for this basename: LABPROT,INR in the last 72 hours ABG:  INR: Will add last result for INR, ABG once components are confirmed Will add last 4 CBG results once components are confirmed  Assessment/Plan: 1.Surgically stable s/p thymectomy and wedge resection of LUL. 2.CV-HR remains in the 100's-110's. Will start low doseLopressor 3.DM-CBGs 129/127/108 .Continue glipizide and Metformin. 4.Pulmonary-Wean O2.Encourage incentive spirometer. 5.Likely to SNF Monday.  ZIMMERMAN,DONIELLE MPA-C 01/27/2012 8:53 AM   I have seen and examined the patient and agree with the assessment and plan as outlined.  Morning Halberg H 01/27/2012 12:11 PM

## 2012-01-27 NOTE — Discharge Summary (Signed)
I agree with the above discharge summary and plan for follow-up.  Taler Kushner H  

## 2012-01-27 NOTE — Discharge Summary (Addendum)
Physician Discharge Summary  Patient ID: Samantha [name] MRN: [id number] DOB/AGE: 1946/05/30 66 y.o.  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 History of hypercholesterolemia 3. History of benign essential hypertension 4. History of diabetes mellitus 5.History of tobacco abuse 6.History of constipation 7.History of palpitations  Discharge Diagnoses:  1.Anterior mediastinal mass ( thymoma) 2. History of hypercholesterolemia 3. History of benign essential hypertension 4. History of diabetes mellitus 5.History of tobacco abuse 6.History of constipation 7.History of palpitations   Procedure (s):  Partial Upper Sternotomy  Thymectomy  Wedge Resection Left Upper Lobe  Cor-matrix Patch Reconstruction of the Pericardium by Dr. Cornelius Moras on 01/23/2012   History of Presenting Illness: This is a 66 year old morbidly obese African American female with poorly controlled type 2 diabetes mellitus, hypertension, and recurrent palpitations who was recently hospitalized at [facility] for symptoms of palpitations. CT angiogram of the chest performed at that time to rule out pulmonary embolus demonstrated the presence of a 3 cm anterior mediastinal mass, most consistent with likely thymoma.She was seen in consultation by Dr. Cornelius Moras on 10/19/2011. Thyroid hormone levels were normal, as were quantitative beta HCG and serum alfa fetoprotein levels. Dr. Cornelius Moras discussed possible options for further evaluation and treatment, including surgical resection versus PET/CT scan with continued follow up, if the lesion was not hypermetabolic on PET, in an effort to avoid the risks of surgery. At the time, the patient was not interested in considering surgery so we obtained a PET/CT scan. This demonstrated hypermetabolic activity in the mass and also raised suspicion regarding the possibility of metastatic disease to her cervical spine;however, MRI  of the spine revealed findings more consistent with degenerative disease rather than osseous metastasis. A transthoracic needle biopsy of the mass was consistent with thymoma. The patient has been cleared for elective surgery by her cardiologist, Dr Eden Emms. Potential risks, consultations, and benefits of the surgery discussed with the patient she agreed to proceed. She was admitted to [facility] cone 01/23/2012 in order to undergo a partial upper sternotomy, thymectomy, wedge resection of left upper lobe, and a Cor-matrix patch reconstruction of the pericardium.   Brief Hospital Course:  She remained afebrile and hemodynamically stable. Her chest tubes were placed to water seal on postoperative day #1. She was felt surgically stable for transfer from the intensive care unit to 3300 for further convalescence. She was restarted on her oral diabetic medicines when she was tolerating a diet. She continued to have good glucose control. She was found have mild acute blood loss anemia. Her last H&H was 11.7 and 35.7. She was also found to have leukocytosis. Her white blood cell count went as high as 18,700.Her last white blood count was down to 16,300 and will be checked again in the morning. She remains afebrile and there are no signs of wound infection. She was requiring a couple liters of oxygen via nasal cannula but was able to be weaned to room air. She's been tolerating a diet has had a bowel movement. Her heart rate remained in the 100s to 110s. As a result, she was started on low-dose Lopressor.Provided she remains afebrile, hemodynamically stable, and pending morning round evaluation, she will be surgically stable for discharge to an SNF on 01/28/2012.   Latest Vital Signs: Blood pressure 129/74, pulse 114, temperature 98.4 F (36.9 C), temperature source Oral, resp. rate 20, height 5' 2.5" (1.588 m), weight 233 lb (105.688 kg), SpO2 93.00%.  Physical Exam: Cardiovascular: Tachycardic.  Pulmonary:  Clear to  auscultation bilaterally; no rales, wheezes, or rhonchi.  Abdomen: Soft, non tender, bowel sounds present.  Wound: Dressing is clean and dry.    Discharge Condition:Stable  Recent laboratory studies:  Lab Results  Component Value Date   WBC 16.3* 01/25/2012   HGB 11.7* 01/25/2012   HCT 35.7* 01/25/2012   MCV 89.3 01/25/2012   PLT 271 01/25/2012   Lab Results  Component Value Date   NA 131* 01/25/2012   K 4.6 01/25/2012   CL 96 01/25/2012   CO2 25 01/25/2012   CREATININE 0.75 01/25/2012   GLUCOSE 119* 01/25/2012      Diagnostic Studies: Dg Chest 2 View  01/25/2012  *RADIOLOGY REPORT*  Clinical Data: Chest tube removal.  Pneumothorax.  PORTABLE CHEST - 1 VIEW  Comparison: 01/25/2012.  Findings: Right IJ central line.  Low volume chest.  Were removal of left thoracostomy tube.  There is no left-sided pneumothorax identified following removal of the chest tube. Monitoring leads are projected over the chest.  Median sternotomy.  Marked bilateral basilar atelectasis associated with low volumes.  The cardiopericardial silhouette and mediastinal contours appear similar to prior.  IMPRESSION: Interval removal of left thoracostomy tube.  No left pneumothorax is identified.  Low volume chest.  Unchanged right IJ central line.  Original Report Authenticated By: Andreas Newport, M.D.    Discharge Medications: Medication List  As of 01/27/2012 11:36 AM   STOP taking these medications         acetaminophen 500 MG tablet         TAKE these medications         aspirin 81 MG tablet   Take 81 mg by mouth daily.      D3 ADULT PO   Take 1,000 Units by mouth daily.      glipiZIDE 10 MG tablet   Commonly known as: GLUCOTROL   Take 10 mg by mouth daily.      loratadine 10 MG tablet   Commonly known as: CLARITIN   Take 10 mg by mouth daily.      metFORMIN 1000 MG tablet   Commonly known as: GLUCOPHAGE   Take 1,000 mg by mouth 2 (two) times daily with a meal.      metoprolol tartrate 25 mg Tabs     Commonly known as: LOPRESSOR   Take 1 tablet (25 mg total) by mouth 2 (two) times daily.      oxyCODONE-acetaminophen 5-325 MG per tablet   Commonly known as: PERCOCET   Take 1-2 tablets by mouth every 4 (four) hours as needed for pain.      rosuvastatin 10 MG tablet   Commonly known as: CRESTOR   Take 10 mg by mouth daily.      triamcinolone cream 0.1 %   Commonly known as: KENALOG   Apply 1 application topically 2 (two) times daily as needed. For irritation            Follow Up Appointments: Follow-up Information    Follow up with Purcell Nails, MD. (PA/LAT CXR to be taken (at [facility] which in same building as Dr. Orvan July office) 45 minutes prior to office appointment;Office will call with an appointment date and time)    Contact information:   [address] [place] [address] [phone]          Signed: Doree Fudge MPA-C 01/27/2012, 11:36 AM

## 2012-01-28 LAB — GLUCOSE, CAPILLARY
Glucose-Capillary: 118 mg/dL — ABNORMAL HIGH (ref 70–99)
Glucose-Capillary: 85 mg/dL (ref 70–99)

## 2012-01-28 LAB — CBC
HCT: 32.7 % — ABNORMAL LOW (ref 36.0–46.0)
Platelets: 296 10*3/uL (ref 150–400)
RDW: 13.5 % (ref 11.5–15.5)
WBC: 9.3 10*3/uL (ref 4.0–10.5)

## 2012-01-28 MED ORDER — METOPROLOL TARTRATE 25 MG PO TABS
25.0000 mg | ORAL_TABLET | Freq: Two times a day (BID) | ORAL | Status: DC
  Administered 2012-01-28 – 2012-01-29 (×2): 25 mg via ORAL
  Filled 2012-01-28 (×4): qty 1

## 2012-01-28 NOTE — Progress Notes (Signed)
Patient has rec'd one SNF bed offer as she is  Self Pay/Medicaid Pending and a letter of guarantee. This SNF does not have bed available today- will continue SNF bed search and advise. Reece Levy, MSW, Theresia Majors [phone]

## 2012-01-28 NOTE — Progress Notes (Signed)
Physical Therapy Treatment Patient Details Name: Samantha [name] MRN: [id number] DOB: 17-Jun-1946 Today's Date: 01/28/2012 Time: 6295-2841 PT Time Calculation (min): 14 min  PT Assessment / Plan / Recommendation Comments on Treatment Session  Pt admitted s/p thymectomy and wedge resection and progressing well. Tolerated greater distance in ambulation this AM. Pt re-educated on sternal precautions. Pt would benefit from PT to help facilitate a safe D/C to SNF.     Follow Up Recommendations  Skilled nursing facility    Barriers to Discharge        Equipment Recommendations  Rolling walker with 5" wheels    Recommendations for Other Services    Frequency Min 3X/week   Plan Discharge plan remains appropriate;Frequency remains appropriate    Precautions / Restrictions Precautions Precautions: Sternal Restrictions Weight Bearing Restrictions: No   Pertinent Vitals/Pain Pt reports 2/10 at chest. Pt repositioned.      Mobility  Bed Mobility Bed Mobility: Not assessed Transfers Transfers: Sit to Stand;Stand to Sit Sit to Stand: 4: Min guard;With upper extremity assist;With armrests;From chair/3-in-1 Stand to Sit: 4: Min guard;With upper extremity assist;With armrests;To chair/3-in-1 Details for Transfer Assistance: Guard for balance. Cues for sequence and safe hand placement.  Ambulation/Gait Ambulation/Gait Assistance: 4: Min guard Ambulation Distance (Feet): 200 Feet Assistive device: Rolling walker Ambulation/Gait Assistance Details: Guard for balance. Cues for sequence and safe hand placement. Had one minor LOB, but self corrected.  Gait Pattern: Step-through pattern;Decreased stride length Stairs: No Wheelchair Mobility Wheelchair Mobility: No    Exercises     PT Diagnosis:    PT Problem List:   PT Treatment Interventions:     PT Goals Acute Rehab PT Goals PT Goal Formulation: With patient Time For Goal Achievement: 02/07/12 Potential to Achieve Goals: Good PT  Goal: Sit to Stand - Progress: Progressing toward goal PT Goal: Stand to Sit - Progress: Progressing toward goal PT Goal: Ambulate - Progress: Progressing toward goal PT Goal: Up/Down Stairs - Progress: Progressing toward goal Additional Goals PT Goal: Additional Goal #1 - Progress: Progressing toward goal  Visit Information  Last PT Received On: 01/26/12 Assistance Needed: +1    Subjective Data  Subjective: "I've already been to the bathroom." Patient Stated Goal: Go home.    Cognition  Overall Cognitive Status: Appears within functional limits for tasks assessed/performed Arousal/Alertness: Awake/alert Orientation Level: Appears intact for tasks assessed Behavior During Session: [facility] for tasks performed    Balance  Balance Balance Assessed: No  End of Session PT - End of Session Equipment Utilized During Treatment: Gait belt Activity Tolerance: Patient tolerated treatment well Patient left: in chair;with call bell/phone within reach Nurse Communication: Mobility status    Oretha Ellis 01/28/2012, 12:53 PM

## 2012-01-28 NOTE — Progress Notes (Signed)
Agree with treatment session.  01/28/2012 Keylah Darwish M Amahri Dengel, PT, DPT [phone]   

## 2012-01-28 NOTE — Progress Notes (Signed)
Patient ambulated 150 ft with rolling walker.  Tolerated well.  Returned to bed.  Will continue to monitor.

## 2012-01-28 NOTE — Progress Notes (Addendum)
                   [address]            [name] [address]          [phone]     5 Days Post-Op  Procedure(s) (LRB): THYMECTOMY (N/A) PARTIAL MEDIASTERNOTOMY (N/A) WEDGE RESECTION () Subjective: Feels well, minimal pain.  Objective  Telemetry SR/Stachy  Temp:  [98.3 F (36.8 C)-98.7 F (37.1 C)] 98.4 F (36.9 C) (05/20 0517) Pulse Rate:  [97-107] 97  (05/20 0517) Resp:  [18-24] 20  (05/20 0517) BP: (114-129)/(75-81) 114/75 mmHg (05/20 0517) SpO2:  [96 %-97 %] 96 % (05/20 0517)   Intake/Output Summary (Last 24 hours) at 01/28/12 0741 Last data filed at 01/27/12 1322  Gross per 24 hour  Intake    480 ml  Output    600 ml  Net   -120 ml       General appearance: alert, cooperative and no distress Heart: regular rate and rhythm and S1, S2 normal Lungs: diminished in bases Abdomen: soft, non-tender, + BS Extremities: no edema Wound: incis. healing well  Lab Results: No results found for this basename: NA:2,K:2,CL:2,CO2:2,GLUCOSE:2,BUN:2,CREATININE:2,CALCIUM:2,MG:2,PHOS:2 in the last 72 hours No results found for this basename: AST:2,ALT:2,ALKPHOS:2,BILITOT:2,PROT:2,ALBUMIN:2 in the last 72 hours No results found for this basename: LIPASE:2,AMYLASE:2 in the last 72 hours  Basename 01/28/12 0530  WBC 9.3  NEUTROABS --  HGB 10.8*  HCT 32.7*  MCV 89.8  PLT 296   No results found for this basename: CKTOTAL:4,CKMB:4,TROPONINI:4 in the last 72 hours No components found with this basename: POCBNP:3 No results found for this basename: DDIMER in the last 72 hours No results found for this basename: HGBA1C in the last 72 hours No results found for this basename: CHOL,HDL,LDLCALC,TRIG,CHOLHDL in the last 72 hours No results found for this basename: TSH,T4TOTAL,FREET3,T3FREE,THYROIDAB in the last 72 hours No results found for this basename: VITAMINB12,FOLATE,FERRITIN,TIBC,IRON,RETICCTPCT in the last 72 hours  Medications: Scheduled    . aspirin   81 mg Oral Daily  . bisacodyl  10 mg Oral Daily  . Chlorhexidine Gluconate Cloth  6 each Topical Daily  . enoxaparin (LOVENOX) injection  30 mg Subcutaneous QHS  . glipiZIDE  10 mg Oral QAC breakfast  . insulin aspart  0-24 Units Subcutaneous TID AC & HS  . loratadine  10 mg Oral Daily  . metFORMIN  1,000 mg Oral BID WC  . metoprolol tartrate  12.5 mg Oral BID  . mupirocin ointment  1 application Nasal BID  . rosuvastatin  10 mg Oral q1800     Radiology/Studies:  No results found.  INR: Will add last result for INR, ABG once components are confirmed Will add last 4 CBG results once components are confirmed  Assessment/Plan: S/P Procedure(s) (LRB): THYMECTOMY (N/A) PARTIAL MEDIASTERNOTOMY (N/A) WEDGE RESECTION ()  Conts to do well. Cont pulm Rx/rehab Will increase lopressor dose Awaits SNF bed, SW is consulted CBG 111-143 range- cont current rx   LOS: 5 days    GOLD,WAYNE E 5/20/20137:41 AM    I have seen and examined the patient and agree with the assessment and plan as outlined.  Ready for d/c to SNF.  I don't see an FL-2 form.  Has one already been signed?  Adilson Grafton H 01/28/2012 8:37 AM

## 2012-01-29 LAB — GLUCOSE, CAPILLARY
Glucose-Capillary: 99 mg/dL (ref 70–99)
Glucose-Capillary: 64 mg/dL — ABNORMAL LOW (ref 70–99)
Glucose-Capillary: 96 mg/dL (ref 70–99)

## 2012-01-29 MED ORDER — METOPROLOL TARTRATE 25 MG PO TABS: 25.0000 mg | ORAL_TABLET | Freq: Two times a day (BID) | ORAL | Status: DC

## 2012-01-29 MED ORDER — MAGNESIUM HYDROXIDE 400 MG/5ML PO SUSP
30.0000 mL | Freq: Every day | ORAL | Status: DC | PRN
  Administered 2012-01-29: 30 mL via ORAL
  Filled 2012-01-29: qty 30

## 2012-01-29 MED ORDER — METOPROLOL TARTRATE 12.5 MG HALF TABLET: 25.0000 mg | ORAL_TABLET | Freq: Two times a day (BID) | ORAL | Status: DC

## 2012-01-29 NOTE — Progress Notes (Signed)
Pt refusing to ambulate at this time; will cont. To monitor. 

## 2012-01-29 NOTE — Progress Notes (Signed)
SNF bed offered with LOG at [facility]- Patient advised and agreeable to this plan. RN to assist with d/c and will await final MD orders. Reece Levy, MSW, Theresia Majors [phone]

## 2012-01-29 NOTE — Clinical Social Work Placement (Signed)
     Clinical Social Work Department CLINICAL SOCIAL WORK PLACEMENT NOTE 01/29/2012  Patient:  Samantha [name], Samantha [name]  Account Number:  [id number] Admit date:  01/23/2012  Clinical Social Worker:  Robin Searing  Date/time:  01/29/2012 12:11 PM  Clinical Social Work is seeking post-discharge placement for this patient at the following level of care:   SKILLED NURSING   (*CSW will update this form in Epic as items are completed)   01/25/2012  Patient/family provided with [facility] Department of Clinical Social Works list of facilities offering this level of care within the geographic area requested by the patient (or if unable, by the patients family).  01/25/2012  Patient/family informed of their freedom to choose among providers that offer the needed level of care, that participate in Medicare, Medicaid or managed care program needed by the patient, have an available bed and are willing to accept the patient.  01/25/2012  Patient/family informed of [facility] ownership interest in [facility], as well as of the fact that they are under no obligation to receive care at this facility.  PASARR submitted to EDS on 01/25/2012 PASARR number received from EDS on 01/28/2012  FL2 transmitted to all facilities in geographic area requested by pt/family on  01/25/2012 FL2 transmitted to all facilities within larger geographic area on   Patient informed that his/her managed care company has contracts with or will negotiate with  certain facilities, including the following:     Patient/family informed of bed offers received:  01/29/2012 Patient chooses bed at [facility] Physician recommends and patient chooses bed at    Patient to be transferred to [facility] on  01/29/2012 Patient to be transferred to facility by ems  The following physician request were entered in Epic:   Additional Comments:

## 2012-01-29 NOTE — Progress Notes (Signed)
BS now 96; will cont. To monitor.

## 2012-01-29 NOTE — Progress Notes (Signed)
Pt ambulated 75 feet in hallway with RN and walker; will cont. To monitor.

## 2012-01-29 NOTE — Progress Notes (Signed)
Pt BS 64 at this time; pt sitting up in chair; pt given orange juice and peanut butter and crackers; will recheck BS in 15 min; will cont. To monitor.

## 2012-01-29 NOTE — Progress Notes (Signed)
Patient for d/c today to SNF bed at [facility] with Medicaid pending/LOG. Patient agreeable to this plan- plan transfer via EMS. Reece Levy, MSW, Theresia Majors [phone]

## 2012-01-29 NOTE — Progress Notes (Signed)
Physical Therapy Note; pt currently meeting with social worker for disability and unable to participate at this time. Will attempt in pm if time allows and pt has not discharged to [employer], PT [phone]

## 2012-01-29 NOTE — Progress Notes (Signed)
Pt c/o constipation; pt given 30ml PO MOM at this time; will cont. To monitor. 

## 2012-01-29 NOTE — Progress Notes (Addendum)
                   [address]            [place] [address]          [phone]     6 Days Post-Op  Procedure(s) (LRB): THYMECTOMY (N/A) PARTIAL MEDIASTERNOTOMY (N/A) WEDGE RESECTION () Subjective: C/O mild constipation  Objective  Telemetry sinus rhythm  Temp:  [97.2 F (36.2 C)-98.5 F (36.9 C)] 98.3 F (36.8 C) (05/21 0436) Pulse Rate:  [83-92] 83  (05/21 0436) Resp:  [18-20] 18  (05/21 0436) BP: (96-144)/(62-86) 144/86 mmHg (05/21 0436) SpO2:  [92 %-96 %] 93 % (05/21 0436)   Intake/Output Summary (Last 24 hours) at 01/29/12 0733 Last data filed at 01/28/12 2100  Gross per 24 hour  Intake    960 ml  Output      1 ml  Net    959 ml       General appearance: alert, cooperative and no distress Heart: regular rate and rhythm and S1, S2 normal Lungs: mildly diminished in bases Abdomen: soft, +BS, non-tender Extremities: no edema Wound: incision healing without signs of infection  Lab Results: No results found for this basename: NA:2,K:2,CL:2,CO2:2,GLUCOSE:2,BUN:2,CREATININE:2,CALCIUM:2,MG:2,PHOS:2 in the last 72 hours No results found for this basename: AST:2,ALT:2,ALKPHOS:2,BILITOT:2,PROT:2,ALBUMIN:2 in the last 72 hours No results found for this basename: LIPASE:2,AMYLASE:2 in the last 72 hours  Basename 01/28/12 0530  WBC 9.3  NEUTROABS --  HGB 10.8*  HCT 32.7*  MCV 89.8  PLT 296   No results found for this basename: CKTOTAL:4,CKMB:4,TROPONINI:4 in the last 72 hours No components found with this basename: POCBNP:3 No results found for this basename: DDIMER in the last 72 hours No results found for this basename: HGBA1C in the last 72 hours No results found for this basename: CHOL,HDL,LDLCALC,TRIG,CHOLHDL in the last 72 hours No results found for this basename: TSH,T4TOTAL,FREET3,T3FREE,THYROIDAB in the last 72 hours No results found for this basename: VITAMINB12,FOLATE,FERRITIN,TIBC,IRON,RETICCTPCT in the last 72  hours  Medications: Scheduled    . aspirin  81 mg Oral Daily  . bisacodyl  10 mg Oral Daily  . Chlorhexidine Gluconate Cloth  6 each Topical Daily  . enoxaparin (LOVENOX) injection  30 mg Subcutaneous QHS  . glipiZIDE  10 mg Oral QAC breakfast  . insulin aspart  0-24 Units Subcutaneous TID AC & HS  . loratadine  10 mg Oral Daily  . metFORMIN  1,000 mg Oral BID WC  . metoprolol tartrate  25 mg Oral BID  . rosuvastatin  10 mg Oral q1800  . DISCONTD: metoprolol tartrate  12.5 mg Oral BID     Radiology/Studies:  No results found.  INR: Will add last result for INR, ABG once components are confirmed Will add last 4 CBG results once components are confirmed  Assessment/Plan: S/P Procedure(s) (LRB): THYMECTOMY (N/A) PARTIAL MEDIASTERNOTOMY (N/A) WEDGE RESECTION ()  1 conts to do well 2 MOM for constipation 3 cont current rx/tx 4 social work looking for bed in snf for short term rehab stay   LOS: 6 days    GOLD,WAYNE E 5/21/20137:33 AM   I have seen and examined the patient and agree with the assessment and plan as outlined.  Chastidy Ranker H 01/29/2012 9:18 AM

## 2012-01-29 NOTE — Discharge Summary (Signed)
I agree with the above discharge summary and plan for follow-up.  The patient's case will be discussed at the Multidisciplinary Thoracic Oncology Conference later this week to discuss whether or not she should be referred for adjuvant radiation therapy.  Samantha [name] H

## 2012-02-19 ENCOUNTER — Other Ambulatory Visit: Payer: Self-pay | Admitting: Thoracic Surgery (Cardiothoracic Vascular Surgery)

## 2012-02-19 DIAGNOSIS — C37 Malignant neoplasm of thymus: Secondary | ICD-10-CM

## 2012-02-22 ENCOUNTER — Other Ambulatory Visit: Payer: Self-pay | Admitting: Thoracic Surgery (Cardiothoracic Vascular Surgery)

## 2012-02-22 DIAGNOSIS — C37 Malignant neoplasm of thymus: Secondary | ICD-10-CM

## 2012-02-25 ENCOUNTER — Ambulatory Visit (INDEPENDENT_AMBULATORY_CARE_PROVIDER_SITE_OTHER): Payer: Self-pay | Admitting: Physician Assistant

## 2012-02-25 ENCOUNTER — Ambulatory Visit
Admission: RE | Admit: 2012-02-25 | Discharge: 2012-02-25 | Disposition: A | Discharge status: Home / Self Care | Payer: Self-pay | Source: Ambulatory Visit | Attending: Thoracic Surgery (Cardiothoracic Vascular Surgery) | Admitting: Thoracic Surgery (Cardiothoracic Vascular Surgery)

## 2012-02-25 ENCOUNTER — Ambulatory Visit: Payer: Self-pay | Admitting: Thoracic Surgery (Cardiothoracic Vascular Surgery)

## 2012-02-25 VITALS — BP 117/69 | HR 94 | Resp 19 | Ht 62.5 in | Wt 228.0 lb

## 2012-02-25 DIAGNOSIS — D150 Benign neoplasm of thymus: Secondary | ICD-10-CM

## 2012-02-25 DIAGNOSIS — C37 Malignant neoplasm of thymus: Secondary | ICD-10-CM

## 2012-02-25 NOTE — Progress Notes (Signed)
  HPI: Patient returns for routine postoperative follow-up having undergone partial upper sternotomy, thymectomy, wedge resection of left upper lobe, and a Cor-matrix patch reconstruction of the pericardium on 01/23/2012. The patient's early postoperative recovery while in the hospital was notable for Leukocytosis with no identifiable source.  She was discharged to a SNF. Since hospital discharge the patient reports that she is doing well.  She denies chest pain and shortness of breath.  She is ambulating without difficulty and her appetite and bowel habits are back to normal.  She does question the need for her diabetes medication and lopressor.  I explained to the patient the continued need for these medications and encouraged her to speak with her PCP who prescribed these medications in regards to further management.  Current Outpatient Prescriptions  Medication Sig Dispense Refill  . aspirin 81 MG tablet Take 81 mg by mouth daily.        . Cholecalciferol (D3 ADULT PO) Take 1,000 Units by mouth daily.      Marland Kitchen glipiZIDE (GLUCOTROL) 10 MG tablet Take 10 mg by mouth daily.        Marland Kitchen loratadine (CLARITIN) 10 MG tablet Take 10 mg by mouth daily.        Marland Kitchen LORazepam (ATIVAN) 1 MG tablet Take 1 mg by mouth every 6 (six) hours as needed.      . metFORMIN (GLUCOPHAGE) 1000 MG tablet Take 1,000 mg by mouth 2 (two) times daily with a meal.        . metoprolol tartrate (LOPRESSOR) 25 MG tablet Take 1 tablet (25 mg total) by mouth 2 (two) times daily.      Marland Kitchen oxyCODONE-acetaminophen (PERCOCET) 5-325 MG per tablet Take 2 tablets by mouth every 4 (four) hours as needed.      . rosuvastatin (CRESTOR) 10 MG tablet Take 10 mg by mouth daily.      Marland Kitchen triamcinolone cream (KENALOG) 0.1 % Apply 1 application topically 2 (two) times daily as needed. For irritation        Physical Exam:  BP 117/69  Pulse 94  Resp 19  Ht 5' 2.5" (1.588 m)  Wt 228 lb (103.42 kg)  BMI 41.04 kg/m2  SpO2 95%  Gen: no apparent  distress Lungs: CTA bilaterally Heart: RRR Abd: soft- non-tender, non-distended Skin: clean and dry, healing well  Diagnostic Tests: CXR: improvement of bilateral pleural effusions  Impression:  Ms. Malachowski is S/P partial upper sternotomy, thymectomy, wedge resection of left upper lobe, and a Cor-matrix patch reconstruction of the pericardium on 01/23/2012. She is doing very well with no acute complaints.     Plan:   Patient was encouraged to continue to increase her activity level as tolerated.  She was instructed that she may begin driving as long as she is no longer using narcotic pain medication.  We will plan to bring her back to clinic if 4 weeks with a chest xray to see Dr. Cornelius Moras

## 2012-03-20 ENCOUNTER — Other Ambulatory Visit: Payer: Self-pay | Admitting: Thoracic Surgery (Cardiothoracic Vascular Surgery)

## 2012-03-20 DIAGNOSIS — C37 Malignant neoplasm of thymus: Secondary | ICD-10-CM

## 2012-03-24 ENCOUNTER — Ambulatory Visit
Admission: RE | Admit: 2012-03-24 | Discharge: 2012-03-24 | Disposition: A | Discharge status: Home / Self Care | Payer: Medicare Other | Source: Ambulatory Visit | Attending: Thoracic Surgery (Cardiothoracic Vascular Surgery) | Admitting: Thoracic Surgery (Cardiothoracic Vascular Surgery)

## 2012-03-24 ENCOUNTER — Encounter: Payer: Self-pay | Admitting: Thoracic Surgery (Cardiothoracic Vascular Surgery)

## 2012-03-24 ENCOUNTER — Ambulatory Visit (INDEPENDENT_AMBULATORY_CARE_PROVIDER_SITE_OTHER): Payer: Self-pay | Admitting: Thoracic Surgery (Cardiothoracic Vascular Surgery)

## 2012-03-24 VITALS — BP 158/89 | HR 92 | Resp 20 | Ht 62.5 in | Wt 226.0 lb

## 2012-03-24 DIAGNOSIS — C37 Malignant neoplasm of thymus: Secondary | ICD-10-CM

## 2012-03-24 DIAGNOSIS — D150 Benign neoplasm of thymus: Secondary | ICD-10-CM

## 2012-03-24 NOTE — Patient Instructions (Signed)
The patient has been reminded to continue to avoid any heavy lifting or strenuous use of arms or shoulders for at least a total of three months from the time of surgery.  

## 2012-03-24 NOTE — Progress Notes (Signed)
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  Postoperatively the patient has done very well. She reports no problems or complaints. She has minimal residual soreness in her chest. Her breathing is good. Appetite is good. She is sleeping well at night.   Current Outpatient Prescriptions  Medication Sig Dispense Refill  . aspirin 81 MG tablet Take 81 mg by mouth daily.        Marland Kitchen glipiZIDE (GLUCOTROL) 10 MG tablet Take 10 mg by mouth daily.        Marland Kitchen loratadine (CLARITIN) 10 MG tablet Take 10 mg by mouth daily.        Marland Kitchen LORazepam (ATIVAN) 1 MG tablet Take 1 mg by mouth every 6 (six) hours as needed.      . metFORMIN (GLUCOPHAGE) 1000 MG tablet Take 1,000 mg by mouth 2 (two) times daily with a meal.        . metoprolol tartrate (LOPRESSOR) 25 MG tablet Take 1 tablet (25 mg total) by mouth 2 (two) times daily.      Marland Kitchen oxyCODONE-acetaminophen (PERCOCET) 5-325 MG per tablet Take 2 tablets by mouth every 4 (four) hours as needed.      . rosuvastatin (CRESTOR) 10 MG tablet Take 10 mg by mouth daily.      Marland Kitchen triamcinolone cream (KENALOG) 0.1 % Apply 1 application topically 2 (two) times daily as needed. 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  Upper normal heart size.  Calcified tortuous aorta.  Pulmonary vascularity normal.  Upper lobe atelectasis on lateral view likely left upper lobe.  No gross infiltrate, pleural effusion or pneumothorax.  Osseous demineralization.  IMPRESSION:  Subsegmental atelectasis left upper lobe.  Otherwise negative exam.  Original Report Authenticated By: Lollie Marrow, M.D.    Impression:  Doing well 2 months s/p thymectomy with wedge resection of left upper lobe for thymoma which was locally adherent to the left lung.  Prior to surgery the patient's case was reviewed in the multidisciplinary thoracic oncology conference. It was consensus that the patient probably did not need any adjuvant treatment at this time but that she will require long-term followup due to the possibility of local recurrence.  Plan:  We will have the patient return in 6 months for followup CT scan. She has been advised to continue to increase her physical activity as tolerated. All of her questions been addressed.  Salvatore Decent. Cornelius Moras, MD 03/24/2012 5:00 PM

## 2012-09-11 ENCOUNTER — Other Ambulatory Visit: Payer: Self-pay | Admitting: Thoracic Surgery (Cardiothoracic Vascular Surgery)

## 2012-10-10 ENCOUNTER — Ambulatory Visit
Admission: RE | Admit: 2012-10-10 | Discharge: 2012-10-10 | Disposition: A | Discharge status: Home / Self Care | Payer: Medicare HMO | Source: Ambulatory Visit | Attending: Thoracic Surgery (Cardiothoracic Vascular Surgery) | Admitting: Thoracic Surgery (Cardiothoracic Vascular Surgery)

## 2012-10-10 ENCOUNTER — Other Ambulatory Visit: Payer: Medicare Other

## 2012-10-10 DIAGNOSIS — D4989 Neoplasm of unspecified behavior of other specified sites: Secondary | ICD-10-CM

## 2012-10-13 ENCOUNTER — Encounter: Payer: Self-pay | Admitting: Thoracic Surgery (Cardiothoracic Vascular Surgery)

## 2012-10-13 ENCOUNTER — Ambulatory Visit (INDEPENDENT_AMBULATORY_CARE_PROVIDER_SITE_OTHER): Payer: Medicare HMO | Admitting: Thoracic Surgery (Cardiothoracic Vascular Surgery)

## 2012-10-13 VITALS — BP 160/80 | HR 92 | Resp 20 | Ht 62.5 in | Wt 245.0 lb

## 2012-10-13 DIAGNOSIS — D4989 Neoplasm of unspecified behavior of other specified sites: Secondary | ICD-10-CM

## 2012-10-13 DIAGNOSIS — D150 Benign neoplasm of thymus: Secondary | ICD-10-CM

## 2012-10-13 DIAGNOSIS — Z09 Encounter for follow-up examination after completed treatment for conditions other than malignant neoplasm: Secondary | ICD-10-CM

## 2012-10-13 NOTE — Progress Notes (Signed)
[address]            [name] [address]          [phone]     CARDIOTHORACIC SURGERY OFFICE NOTE  Referring Provider is Edd Arbour, MD PCP is Georganna Skeans, MD   HPI:  Patient returns for followup status post resection of thymoma via miniature upper sternotomy last May. She was last seen here in the office on 03/24/2012. Since then she has done well.  She states that she is just now starting with a new primary care physician to look after her multiple medical problems. She does not know the name of her new physician. She does not have any residual pain in her chest from her surgery. She denies any shortness of breath. She reports that her blood sugars have been under good control as has her blood pressure. Overall she has no complaints.   Current Outpatient Prescriptions  Medication Sig Dispense Refill  . aspirin 81 MG tablet Take 81 mg by mouth daily.        . cholecalciferol (VITAMIN D) 1000 UNITS tablet Take 1,000 Units by mouth daily.      Marland Kitchen glipiZIDE (GLUCOTROL) 10 MG tablet Take 5 mg by mouth daily.       Marland Kitchen loratadine (CLARITIN) 10 MG tablet Take 10 mg by mouth daily.        . metFORMIN (GLUCOPHAGE) 1000 MG tablet Take 1,000 mg by mouth 2 (two) times daily with a meal.        . rosuvastatin (CRESTOR) 10 MG tablet Take 10 mg by mouth daily.      Marland Kitchen triamcinolone cream (KENALOG) 0.1 % Apply 1 application topically 2 (two) times daily as needed. For irritation          Physical Exam:   BP 160/80  Pulse 92  Resp 20  Ht 5' 2.5" (1.588 m)  Wt 245 lb (111.131 kg)  BMI 44.10 kg/m2  SpO2 97%  General:  Morbidly obese otherwise well-appearing  Chest:   Clear to auscultation  CV:   Regular rate and rhythm  Incisions:  Scar has healed completely and the sternum is stable  Abdomen:  Soft and nontender  Extremities:  Warm and well-perfused  Diagnostic Tests:  *RADIOLOGY REPORT*  Clinical Data: Follow-up thymoma. History of thymoma  resection May  2013.  CT CHEST WITHOUT CONTRAST  Technique: Multidetector CT imaging of the chest was performed  following the standard protocol without IV contrast.  Comparison: 10/18/2011.  Findings: The chest wall is unremarkable. No breast masses,  supraclavicular or axillary lymphadenopathy. There are surgical  changes with median sternotomy wires. The bony thorax is intact.  No destructive bone lesions or spinal canal compromise. Moderate  degenerative changes involving the thoracic spine.  A left thyroid gland nodule appears stable. The thymic lesion has  been removed since the prior CT scan. No findings for residual or  recurrent tumor. No mediastinal or hilar lymphadenopathy. Small  scattered lymph nodes are stable. The aorta is normal in caliber.  Stable atherosclerotic calcifications. The esophagus is grossly  normal.  Examination of the lung parenchyma demonstrates surgical changes in  the left upper lobe. No worrisome lung lesions or findings to  suggest metastatic pulmonary disease. No pleural effusion or  pleural thickening.  The upper abdomen is unremarkable.  IMPRESSION:  1. Status post surgical resection of a thymoma. No  findings for  residual or recurrent tumor.  2. Stable left thyroid gland lesion.  3. No acute pulmonary findings or evidence of metastatic pulmonary  nodules.  Original Report Authenticated By: Rudie Meyer, M.D.    Impression:  The patient is doing well more than 9 months following resection of her thymoma.   Plan:  We will have the patient return in one years time for followup CT scan for surveillance.   Salvatore Decent. Cornelius Moras, MD 10/13/2012 7:50 PM

## 2012-11-12 IMAGING — CT CT BIOPSY
2 series · 14 of 31 positions shown, 16 images · non-contrast
Comparison: none

Clinical Data/Indication: ANTERIOR MEDIASTINAL MASS

CT-GUIDED BIOPSY OF A ANTERIOR MEDIASTINAL MASS.  CORE.
Sedation: Versed 2 mg, Fentanyl 100 mcg.
Total Moderate Sedation Time: 11 minutes.
Procedure: The procedure, risks, benefits, and alternatives were
explained to the patient. Questions regarding the procedure were
encouraged and answered. The patient understands and consents to
the procedure.
The anterior chest was prepped with betadine in a sterile fashion,
and a sterile drape was applied covering the operative field. A
sterile gown and sterile gloves were used for the procedure.
Under CT guidance, a 17 gauge needle was inserted into the anterior
mediastinal mass.  Eight 18 gauge core biopsies were obtained.
Final imaging was performed.
Patient tolerated the procedure well without complication.  Vital
sign monitoring by nursing staff during the procedure will continue
as patient is in the special procedures unit for post procedure
observation.

[Series 2: routine chest · axial · 0.66mm/px · z∈[-161,-116]mm · 6 of 15 slices shown, 8 images]
[im 3/15  mediastinal]
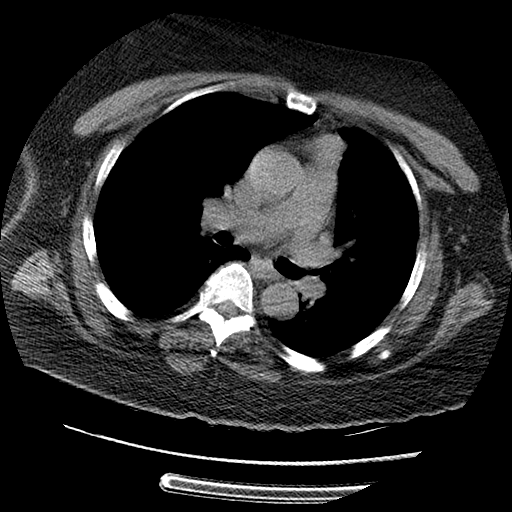
[im 3/15  lung]
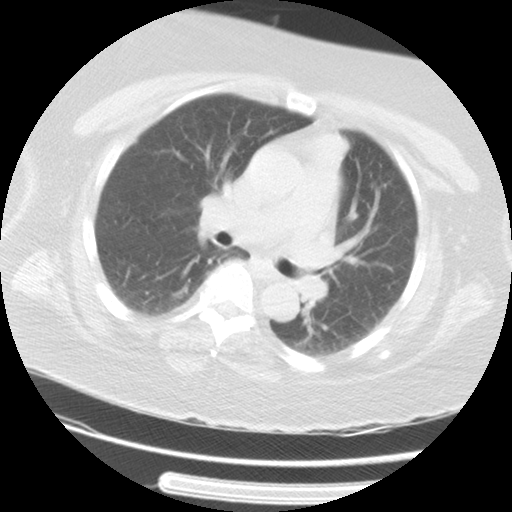
[im 5/15  lung]
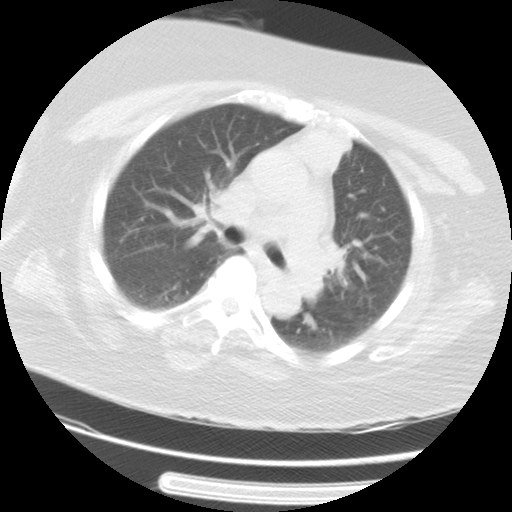
[im 6/15  lung]
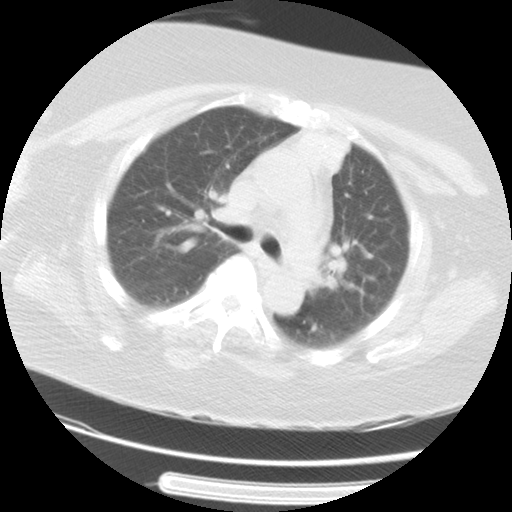
[im 8/15  lung]
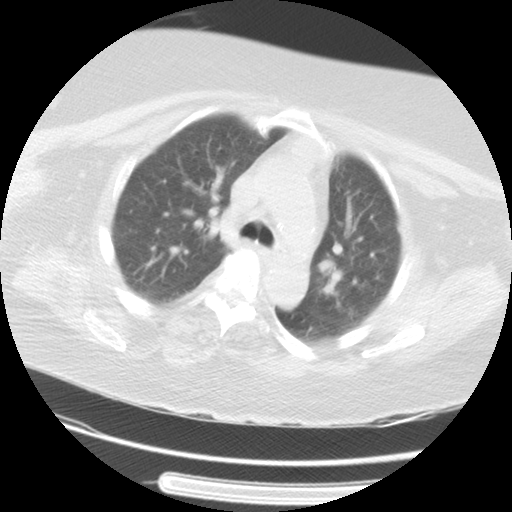
[im 10/15  mediastinal]
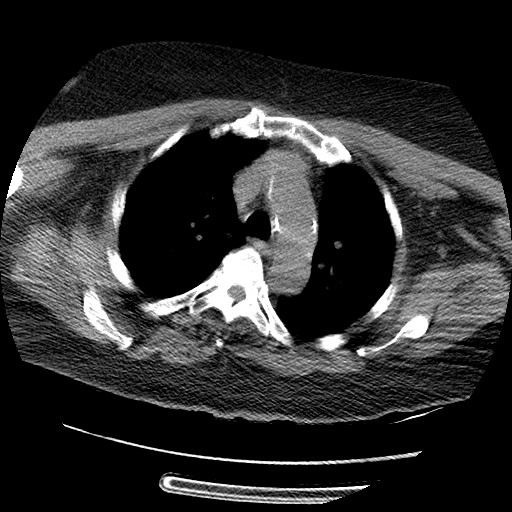
[im 10/15  lung]
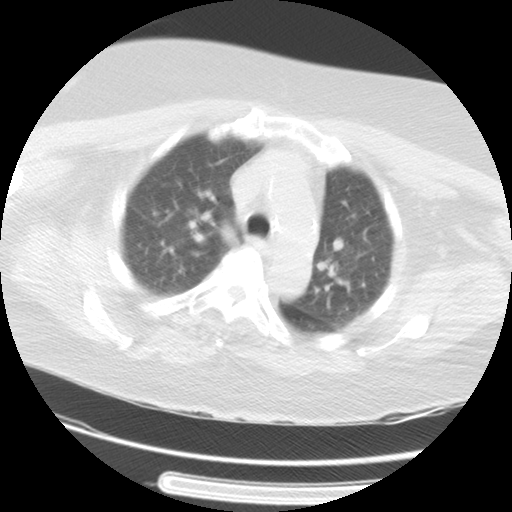
[im 12/15  lung]
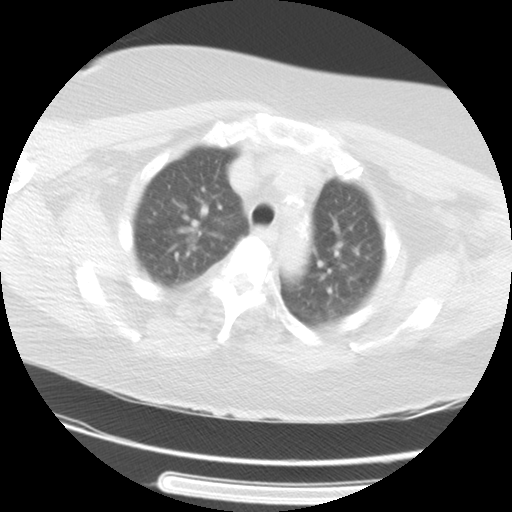

[Series 3: — · axial · 0.66mm/px · z∈[-138,-128]mm · 8 of 45 slices shown]
[im 5/45  lung]
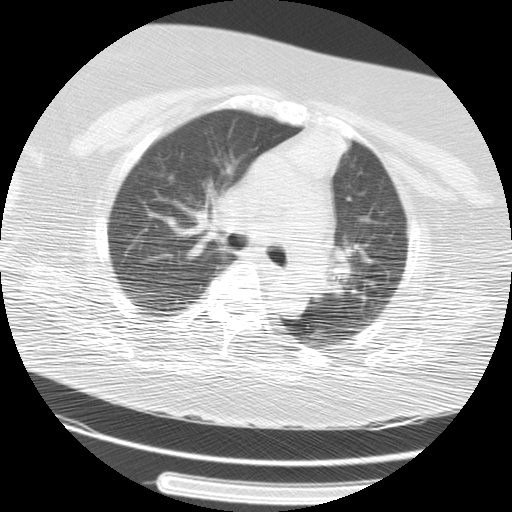
[im 9/45  lung]
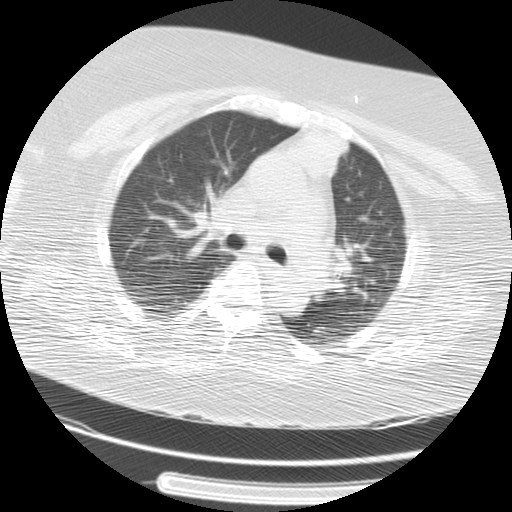
[im 15/45  lung]
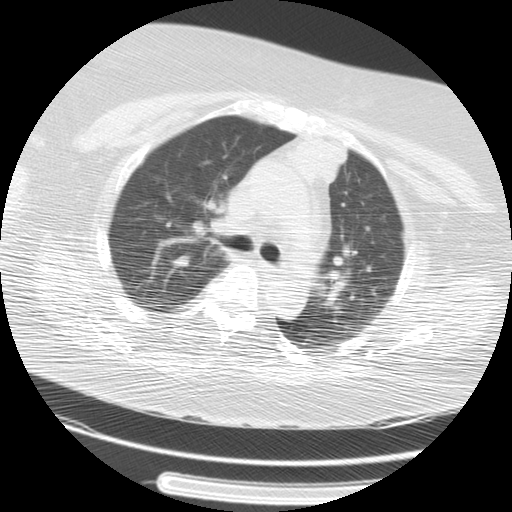
[im 20/45  lung]
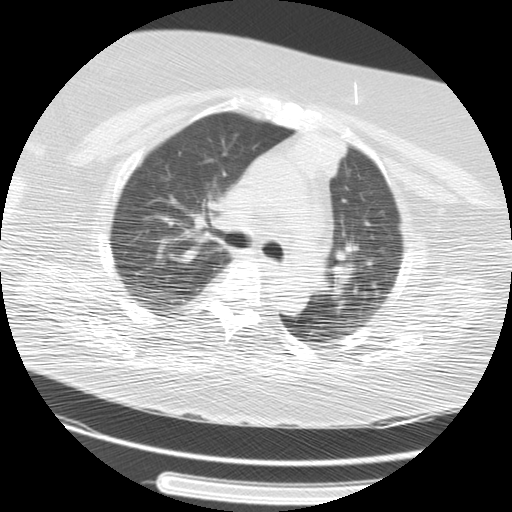
[im 25/45  lung]
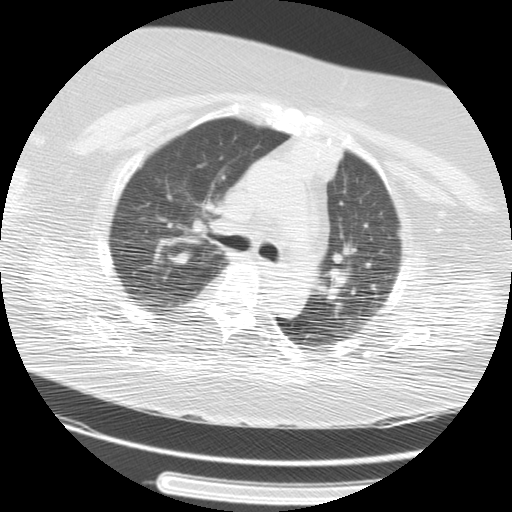
[im 30/45  lung]
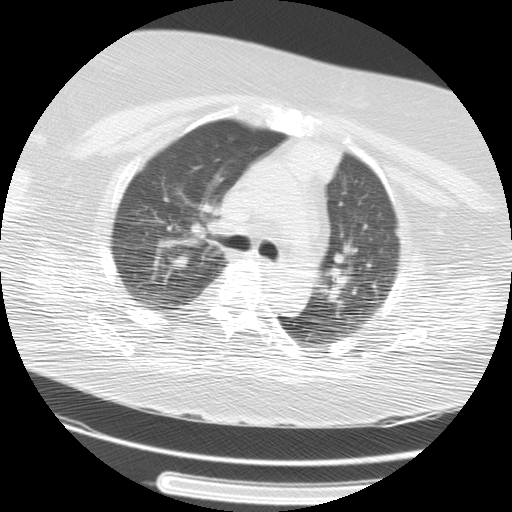
[im 36/45  lung]
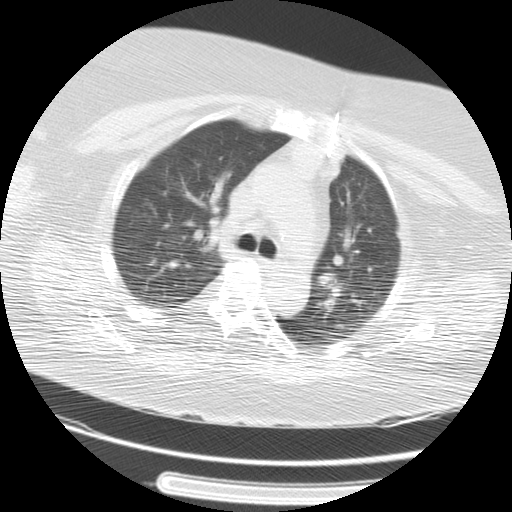
[im 40/45  lung]
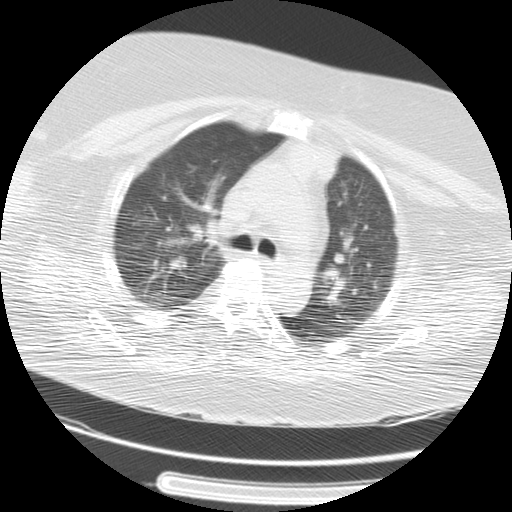

[14 of 31 positions shown; findings below may reference images not displayed]

FINDINGS: The images document guide needle placement within the
anterior mediastinal mass.  Post biopsy images demonstrate no
evidence of hemorrhage.
IMPRESSION: Successful CT-guided core  biopsy of the anterior mediastinal mass.

## 2012-12-03 IMAGING — CR DG CHEST 2V
2 series · 2 of 2 positions shown · non-contrast
Comparison: 10/18/2011

CLINICAL DATA: Preop thymectomy.

CHEST - 2 VIEW

[view not recorded (1 of 2)]
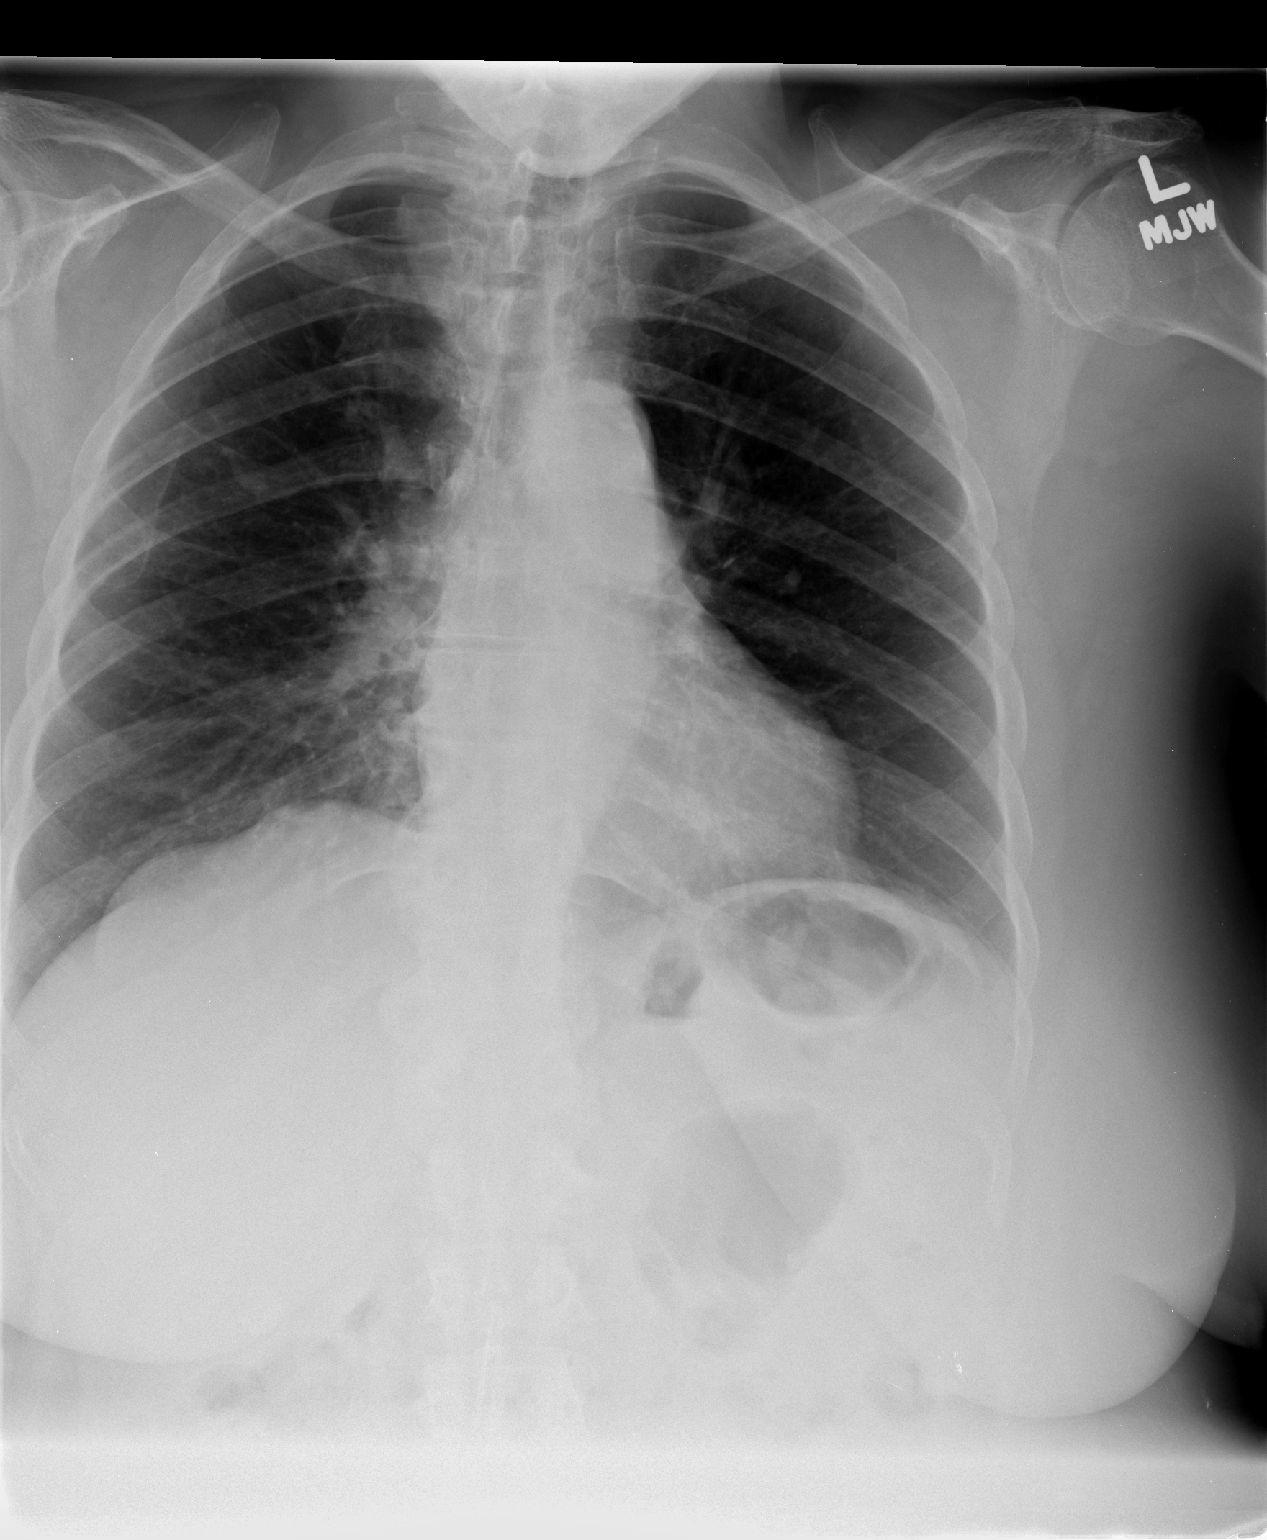

[view not recorded (2 of 2)]
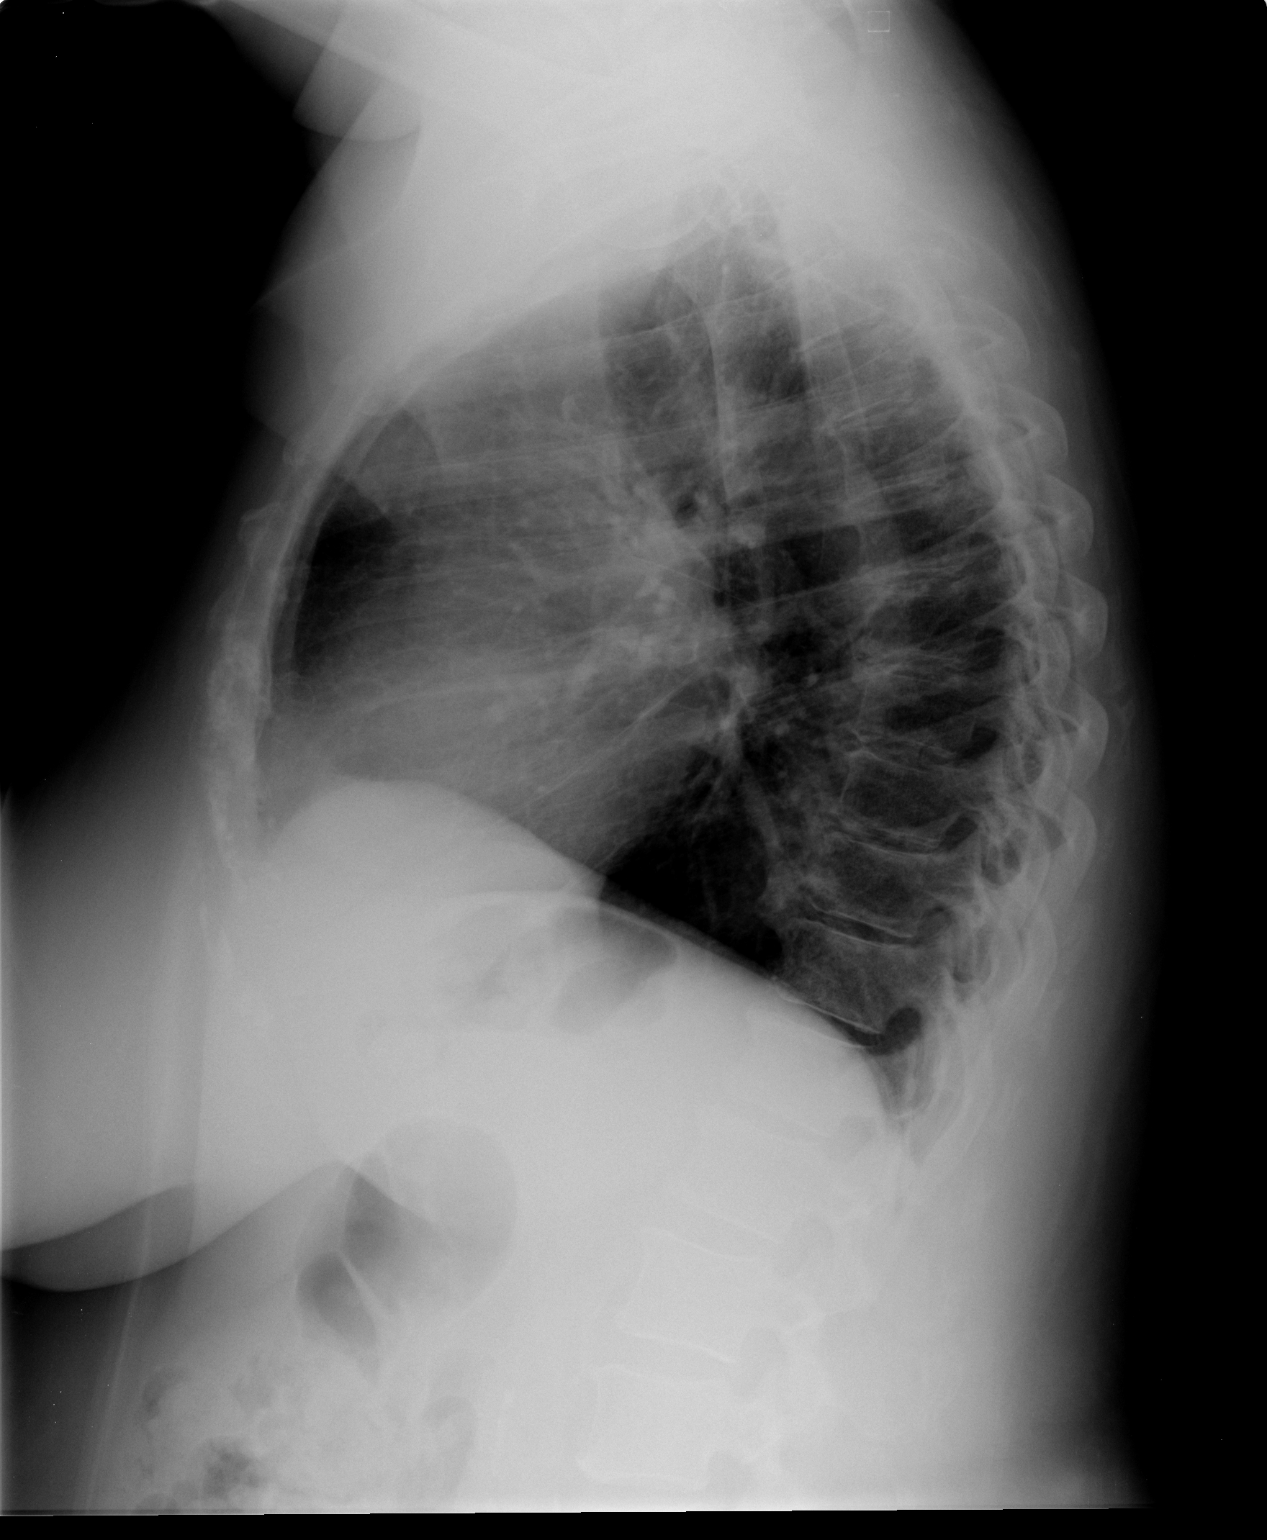

[2 of 2 positions shown; findings below may reference images not displayed]

FINDINGS: Normal heart size and normal vascularity.  Lungs  are
clear without infiltrate or effusion.  Anterior mediastinal mass
seen on prior cross-sectional imaging is not well seen on this
chest x-ray.
IMPRESSION: No acute cardiopulmonary disease.

## 2013-06-24 ENCOUNTER — Encounter: Payer: Medicare HMO | Attending: Nurse Practitioner | Admitting: Dietician

## 2013-06-24 VITALS — Ht 62.5 in | Wt 236.8 lb

## 2013-06-24 DIAGNOSIS — E119 Type 2 diabetes mellitus without complications: Secondary | ICD-10-CM

## 2013-06-24 DIAGNOSIS — E1149 Type 2 diabetes mellitus with other diabetic neurological complication: Secondary | ICD-10-CM | POA: Insufficient documentation

## 2013-06-24 DIAGNOSIS — Z713 Dietary counseling and surveillance: Secondary | ICD-10-CM | POA: Insufficient documentation

## 2013-06-25 ENCOUNTER — Encounter: Payer: Self-pay | Admitting: Dietician

## 2013-06-25 NOTE — Patient Instructions (Signed)
Goals:  Monitor glucose levels as instructed by your doctor 

## 2013-06-25 NOTE — Progress Notes (Signed)
Patient was seen on 06/24/13 for the first of a series of three diabetes self-management courses at the [facility].  Current HbA1c: 8.0 on 8/20  The following learning objectives were met by the patient during this class:  Describe diabetes  State some common risk factors for diabetes  Defines the role of glucose and insulin  Identifies type of diabetes and pathophysiology  Describe the relationship between diabetes and cardiovascular risk  State the members of the Healthcare Team  States the rationale for glucose monitoring  State when to test glucose  State their individual Target Range  State the importance of logging glucose readings  Describe how to interpret glucose readings  Identifies A1C target  Explain the correlation between A1c and eAG values  State symptoms and treatment of high blood glucose  State symptoms and treatment of low blood glucose  Explain proper technique for glucose testing  Identifies proper sharps disposal  Handouts given during class include:  Living Well with Diabetes book  Carb Counting and Meal Planning book  Meal Plan Card  Carbohydrate guide  Meal planning worksheet  Low Sodium Flavoring Tips  The diabetes portion plate  Low Carbohydrate Snack Suggestions  A1c to eAG Conversion Chart  Diabetes Medications  Stress Management  Diabetes Recommended Care Schedule  Diabetes Success Plan  Core Class Satisfaction Survey  Your patient has identified their diabetes care support plan as:  [facility]  Staff   Follow-Up Plan:  Attend core 2

## 2013-07-01 ENCOUNTER — Encounter: Payer: Medicare HMO | Admitting: Dietician

## 2013-07-01 DIAGNOSIS — E119 Type 2 diabetes mellitus without complications: Secondary | ICD-10-CM

## 2013-07-01 NOTE — Patient Instructions (Addendum)
Goals:  Follow Diabetes Meal Plan as instructed  Eat 3 meals and 2 snacks, every 3-5 hrs  Limit carbohydrate intake to 30-45 grams carbohydrate/meal  Limit carbohydrate intake to 0-15 grams carbohydrate/snack  Add lean protein foods to meals/snacks  Monitor glucose levels as instructed by your doctor  Aim for 30 mins of physical activity daily  

## 2013-07-01 NOTE — Progress Notes (Signed)
Patient was seen on 07/01/2013 for the second of a series of three diabetes self-management courses at the [facility]. The following learning objectives were met by the patient during this class:   Describe the role of different macronutrients on glucose  Explain how carbohydrates affect blood glucose  State what foods contain the most carbohydrates  Demonstrate carbohydrate counting  Demonstrate how to read Nutrition Facts food label  Describe effects of various fats on heart health  Describe the importance of good nutrition for health and healthy eating strategies  Describe techniques for managing your shopping, cooking and meal planning  List strategies to follow meal plan when dining out  Describe the effects of alcohol on glucose and how to use it safely  Follow-Up Plan:  Attend Core 3  Work towards following your personal food plan.

## 2013-07-08 DIAGNOSIS — E1149 Type 2 diabetes mellitus with other diabetic neurological complication: Secondary | ICD-10-CM

## 2013-07-08 NOTE — Progress Notes (Signed)
Patient was seen on 07/08/13 for the third of a series of three diabetes self-management courses at the [facility]. The following learning objectives were met by the patient during this class:    State the amount of activity recommended for healthy living   Describe activities suitable for individual needs   Identify ways to regularly incorporate activity into daily life   Identify barriers to activity and ways to over come these barriers  Identify diabetes medications being personally used and their primary action for lowering glucose and possible side effects   Describe role of stress on blood glucose and develop strategies to address psychosocial issues   Identify diabetes complications and ways to prevent them  Explain how to manage diabetes during illness   Evaluate success in meeting personal goal   Establish 2-3 goals that they will plan to diligently work on until they return for the free 40-month follow-up visit  Your patient has established the following 4 month goals in their individualized success plan:  Count carbohydrates at most meals and snacks  Reduce fat in my diet by eating less margarine  Be active 10 minutes 5 days a week  Your patient has identified these potential barriers to change:  weather  Busy schedule  Your patient has identified their diabetes self-care support plan as  daughter

## 2013-10-13 ENCOUNTER — Other Ambulatory Visit: Payer: Self-pay | Admitting: *Deleted

## 2013-10-13 DIAGNOSIS — D150 Benign neoplasm of thymus: Principal | ICD-10-CM

## 2013-10-13 DIAGNOSIS — D4989 Neoplasm of unspecified behavior of other specified sites: Secondary | ICD-10-CM

## 2013-10-26 ENCOUNTER — Ambulatory Visit (INDEPENDENT_AMBULATORY_CARE_PROVIDER_SITE_OTHER): Payer: Commercial Managed Care - HMO | Admitting: Thoracic Surgery (Cardiothoracic Vascular Surgery)

## 2013-10-26 ENCOUNTER — Ambulatory Visit
Admission: RE | Admit: 2013-10-26 | Discharge: 2013-10-26 | Disposition: A | Discharge status: Home / Self Care | Payer: Commercial Managed Care - HMO | Source: Ambulatory Visit | Attending: Thoracic Surgery (Cardiothoracic Vascular Surgery) | Admitting: Thoracic Surgery (Cardiothoracic Vascular Surgery)

## 2013-10-26 ENCOUNTER — Encounter: Payer: Self-pay | Admitting: Thoracic Surgery (Cardiothoracic Vascular Surgery)

## 2013-10-26 VITALS — BP 150/89 | HR 90 | Resp 20 | Ht 62.5 in | Wt 230.0 lb

## 2013-10-26 DIAGNOSIS — D4989 Neoplasm of unspecified behavior of other specified sites: Secondary | ICD-10-CM

## 2013-10-26 DIAGNOSIS — D150 Benign neoplasm of thymus: Secondary | ICD-10-CM

## 2013-10-26 NOTE — Progress Notes (Signed)
      [address]       [place] [address]             [phone]     CARDIOTHORACIC SURGERY OFFICE NOTE  Referring Provider is Becky Sax, MD PCP is Becky Sax, MD   HPI:  Patient returns for followup status post resection of thymoma via miniature upper sternotomy on 01/23/2012. She was last seen here in the office on 10/13/2012.   She reports no problems over the past year and she specifically denies any significant health problems. She has not been hospitalized for any reason. She denies any chest pain or shortness of breath.    Current Outpatient Prescriptions  Medication Sig Dispense Refill  . aspirin 81 MG tablet Take 81 mg by mouth daily.        . cholecalciferol (VITAMIN D) 1000 UNITS tablet Take 1,000 Units by mouth daily.      Marland Kitchen glipiZIDE (GLUCOTROL) 10 MG tablet Take 5 mg by mouth daily.       Marland Kitchen loratadine (CLARITIN) 10 MG tablet Take 10 mg by mouth daily.        . metFORMIN (GLUCOPHAGE) 1000 MG tablet Take 1,000 mg by mouth 2 (two) times daily with a meal.        . rosuvastatin (CRESTOR) 10 MG tablet Take 5 mg by mouth daily.       Marland Kitchen triamcinolone cream (KENALOG) 0.1 % Apply 1 application topically 2 (two) times daily as needed. For irritation       No current facility-administered medications for this visit.      Physical Exam:   BP 150/89  Pulse 90  Resp 20  Ht 5' 2.5" (1.588 m)  Wt 230 lb (104.327 kg)  BMI 41.37 kg/m2  SpO2 96%  General:  Well-appearing  Chest:   Clear to auscultation  CV:   Regular rate and rhythm without murmur  Incisions:  n/a  Abdomen:  soft  Extremities:  warm  Diagnostic Tests:  CT CHEST WITHOUT CONTRAST  TECHNIQUE:  Multidetector CT imaging of the chest was performed following the  standard protocol without IV contrast. Sagittal and coronal MPR  images reconstructed from axial data set.  COMPARISON: 10/10/2012  FINDINGS:  Nodule at inferior pole left lobe thyroid gland at least 2.0 cm    diameter.  Scattered atherosclerotic calcifications aorta and coronary  arteries.  Respiratory and cardiac motion artifacts present.  Visualized portion of upper abdomen normal appearance.  Prior median sternotomy.  No definite mediastinal mass or thoracic adenopathy, though hilar  assessment is limited by lack of IV contrast.  No gross hilar enlargement identified.  Anterior scarring left upper lobe unchanged.  No acute infiltrate, pleural effusion or pneumothorax.  Bones unremarkable.  IMPRESSION:  Stable left thyroid nodule and postsurgical changes of the left  upper lobe.  Prior median sternotomy and thymoma resection.  No acute abnormalities.  Electronically Signed  By: Lavonia Dana M.D.  On: 10/26/2013 10:05     Impression:  Patient is doing well nearly 2 years status post thymectomy. 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  Plan:  The patient will return in 2 years for followup CT scan for routine surveillance.   Valentina Gu. Roxy Manns, MD 10/26/2013 11:21 AM

## 2013-11-11 ENCOUNTER — Encounter: Payer: Medicare HMO | Attending: Family | Admitting: Dietician

## 2013-11-11 VITALS — Ht 62.5 in | Wt 227.1 lb

## 2013-11-11 DIAGNOSIS — E119 Type 2 diabetes mellitus without complications: Secondary | ICD-10-CM

## 2013-11-11 DIAGNOSIS — Z713 Dietary counseling and surveillance: Secondary | ICD-10-CM | POA: Insufficient documentation

## 2013-11-11 NOTE — Progress Notes (Signed)
Medical Nutrition Therapy:  Appt start time: 6384 end time:  1615.   Assessment:  Samantha [name] is here today for a follow-up after completing core classes in October 2014. She reports that she is not carb counting but she "tries to eat light."  She is checking blood sugars regularly: around 80 in the mornings. About 120 in the evenings. No recent HgbA1c available.   Preferred Learning Style:  No preference indicated   Learning Readiness:  Ready  MEDICATIONS: see list   DIETARY INTAKE:  24-hr recall:  B ( AM): 1/4 cup of oatmeal and egg beaters, slice of bread, and coffee with sweet and low  Snk ( AM): peanut butter and crackers or some fruit or yogurt  L ( PM): baked chicken and 1/4 rice and cabbage or salad Snk ( PM): peanut butter and crackers D ( PM): chicken and rice with salad; canned spinach Snk ( PM): not usually  Beverages: coffee, water, diet Dr. Malachi Bonds  Usual physical activity: walking around the neighborhood but none recently  Estimated energy needs: 1600 calories 180 g carbohydrates  Progress Towards Goal(s):  In progress.   Nutritional Diagnosis:  Lipan-2.1 Impaired nutrition utilization As related to diabetes diagnosis As evidenced by HgbA1c not WNL.    Intervention:  Nutrition counseling provided. Reviewed CHO counting. Encouraged physical activity and continue to check blood sugars.  Teaching Method Utilized: Visual Auditory Hands on   Barriers to learning/adherence to lifestyle change: none  Demonstrated degree of understanding via:  Teach Back   Monitoring/Evaluation:  Dietary intake, exercise, carb counting, and body weight prn.

## 2013-12-02 ENCOUNTER — Encounter: Payer: Self-pay | Admitting: Podiatry

## 2013-12-02 ENCOUNTER — Ambulatory Visit (INDEPENDENT_AMBULATORY_CARE_PROVIDER_SITE_OTHER): Payer: Commercial Managed Care - HMO | Admitting: Podiatry

## 2013-12-02 VITALS — BP 153/84 | HR 87 | Resp 12

## 2013-12-02 DIAGNOSIS — Q828 Other specified congenital malformations of skin: Secondary | ICD-10-CM

## 2013-12-02 DIAGNOSIS — M79609 Pain in unspecified limb: Secondary | ICD-10-CM

## 2013-12-02 DIAGNOSIS — B351 Tinea unguium: Secondary | ICD-10-CM

## 2013-12-02 NOTE — Patient Instructions (Signed)
Diabetes and Foot Care Diabetes may cause you to have problems because of poor blood supply (circulation) to your feet and legs. This may cause the skin on your feet to become thinner, break easier, and heal more slowly. Your skin may become dry, and the skin may peel and crack. You may also have nerve damage in your legs and feet causing decreased feeling in them. You may not notice minor injuries to your feet that could lead to infections or more serious problems. Taking care of your feet is one of the most important things you can do for yourself.  HOME CARE INSTRUCTIONS  Wear shoes at all times, even in the house. Do not go barefoot. Bare feet are easily injured.  Check your feet daily for blisters, cuts, and redness. If you cannot see the bottom of your feet, use a mirror or ask someone for help.  Wash your feet with warm water (do not use hot water) and mild soap. Then pat your feet and the areas between your toes until they are completely dry. Do not soak your feet as this can dry your skin.  Apply a moisturizing lotion or petroleum jelly (that does not contain alcohol and is unscented) to the skin on your feet and to dry, brittle toenails. Do not apply lotion between your toes.  Trim your toenails straight across. Do not dig under them or around the cuticle. File the edges of your nails with an emery board or nail file.  Do not cut corns or calluses or try to remove them with medicine.  Wear clean socks or stockings every day. Make sure they are not too tight. Do not wear knee-high stockings since they may decrease blood flow to your legs.  Wear shoes that fit properly and have enough cushioning. To break in new shoes, wear them for just a few hours a day. This prevents you from injuring your feet. Always look in your shoes before you put them on to be sure there are no objects inside.  Do not cross your legs. This may decrease the blood flow to your feet.  If you find a minor scrape,  cut, or break in the skin on your feet, keep it and the skin around it clean and dry. These areas may be cleansed with mild soap and water. Do not cleanse the area with peroxide, alcohol, or iodine.  When you remove an adhesive bandage, be sure not to damage the skin around it.  If you have a wound, look at it several times a day to make sure it is healing.  Do not use heating pads or hot water bottles. They may burn your skin. If you have lost feeling in your feet or legs, you may not know it is happening until it is too late.  Make sure your health care provider performs a complete foot exam at least annually or more often if you have foot problems. Report any cuts, sores, or bruises to your health care provider immediately. SEEK MEDICAL CARE IF:   You have an injury that is not healing.  You have cuts or breaks in the skin.  You have an ingrown nail.  You notice redness on your legs or feet.  You feel burning or tingling in your legs or feet.  You have pain or cramps in your legs and feet.  Your legs or feet are numb.  Your feet always feel cold. SEEK IMMEDIATE MEDICAL CARE IF:   There is increasing redness,   swelling, or pain in or around a wound.  There is a red line that goes up your leg.  Pus is coming from a wound.  You develop a fever or as directed by your health care provider.  You notice a bad smell coming from an ulcer or wound. Document Released: 08/24/2000 Document Revised: 04/29/2013 Document Reviewed: 02/03/2013 [employer] Patient Information 2014 [employer].  

## 2013-12-02 NOTE — Progress Notes (Signed)
   Subjective:    Patient ID: Samantha [name], female    DOB: May 28, 1946, 68 y.o.   MRN: [id number]  HPI PT STATED BOTH FEET HAVE CALLUSES AND THEY BEEN HURTING FOR 1 MONTH. THE CALLUSES ARE GETTING THICKER AND WORSE. THE FEET GET AGGRAVATED BY PRESSURE. TRIED TO USED OTC CREAM TO GET THE CALLUS SOFT AND VASELINE IT HELP SOME.    Review of Systems  All other systems reviewed and are negative.       Objective:   Physical Exam Orientated x3 black female  Vascular: DP is are 2/4 bilaterally. PTs 1/4 bilaterally  Neurological: Sensation to 10 g monofilament wire intact 5/5 bilaterally. Vibratory sensation intact bilaterally. Ankle Reflexes reactive bilaterally.  Dermatological: Elongated, incurvated hypertrophic discolored toenails x10. Nucleated plantar keratoses sub-fifth MPJ bilaterally.  Musculoskeletal: Hammertoe second and third left.        Assessment & Plan:   Assessment: Possible peripheral arterial disease because of decreased pedal pulses Satisfactory neurological status Neglected symptomatic onychomycoses x10 Porokeratoses x2  Plan: Nails x10 and keratoses x2 debrided without any bleeding.  Reappoint at three-month intervals. After visit summary with diabetic footcare provided

## 2013-12-03 ENCOUNTER — Encounter: Payer: Self-pay | Admitting: Podiatry

## 2014-09-01 ENCOUNTER — Encounter (INDEPENDENT_AMBULATORY_CARE_PROVIDER_SITE_OTHER): Payer: Commercial Managed Care - HMO | Admitting: Ophthalmology

## 2015-10-05 ENCOUNTER — Other Ambulatory Visit: Payer: Self-pay | Admitting: Thoracic Surgery (Cardiothoracic Vascular Surgery)

## 2015-10-05 DIAGNOSIS — D150 Benign neoplasm of thymus: Secondary | ICD-10-CM

## 2015-10-31 ENCOUNTER — Encounter: Payer: Self-pay | Admitting: Thoracic Surgery (Cardiothoracic Vascular Surgery)

## 2015-10-31 ENCOUNTER — Ambulatory Visit (INDEPENDENT_AMBULATORY_CARE_PROVIDER_SITE_OTHER): Payer: Medicare HMO | Admitting: Thoracic Surgery (Cardiothoracic Vascular Surgery)

## 2015-10-31 ENCOUNTER — Other Ambulatory Visit: Payer: Self-pay | Admitting: *Deleted

## 2015-10-31 ENCOUNTER — Ambulatory Visit
Admission: RE | Admit: 2015-10-31 | Discharge: 2015-10-31 | Disposition: A | Discharge status: Home / Self Care | Payer: Medicaid Other | Source: Ambulatory Visit | Attending: Thoracic Surgery (Cardiothoracic Vascular Surgery) | Admitting: Thoracic Surgery (Cardiothoracic Vascular Surgery)

## 2015-10-31 VITALS — BP 116/66 | HR 96 | Resp 20 | Ht 62.5 in | Wt 224.0 lb

## 2015-10-31 DIAGNOSIS — D4989 Neoplasm of unspecified behavior of other specified sites: Secondary | ICD-10-CM

## 2015-10-31 DIAGNOSIS — D150 Benign neoplasm of thymus: Secondary | ICD-10-CM

## 2015-10-31 MED ORDER — IOPAMIDOL (ISOVUE-370) INJECTION 76%
100.0000 mL | Freq: Once | INTRAVENOUS | Status: AC | PRN
  Administered 2015-10-31: 100 mL via INTRAVENOUS

## 2015-10-31 NOTE — Patient Instructions (Signed)
Schedule ultrasound of thyroid gland

## 2015-10-31 NOTE — Progress Notes (Signed)
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  She was last seen here in our office on 10/26/2013. She states that over the past 2 years she has done very well from a physical standpoint. She reports no new medical problems or complaints. She follows up periodically at the [facility] on [address] for management of her hypertension and diabetes.  She denies any problems with chest pain or shortness of breath.   Current Outpatient Prescriptions  Medication Sig Dispense Refill  . ACCU-CHEK AVIVA PLUS test strip     . ACCU-CHEK FASTCLIX LANCETS MISC     . aspirin 81 MG tablet Take 81 mg by mouth daily.      . cholecalciferol (VITAMIN D) 1000 UNITS tablet Take 1,000 Units by mouth daily.    Marland Kitchen glipiZIDE (GLUCOTROL) 5 MG tablet Take 5 mg by mouth every morning.  0  . loratadine (CLARITIN) 10 MG tablet Take 10 mg by mouth daily.      . metFORMIN (GLUCOPHAGE) 1000 MG tablet Take 1,000 mg by mouth 2 (two) times daily with a meal.      . rosuvastatin (CRESTOR) 10 MG tablet Take 5 mg by mouth daily.     Marland Kitchen triamcinolone cream (KENALOG) 0.1 % Apply 1 application topically 2 (two) times daily as needed. For irritation    . valsartan-hydrochlorothiazide (DIOVAN-HCT) 160-25 MG tablet Take 1 tablet by mouth daily.  0  . VOLTAREN 1 % GEL apply 4 grams to affected area twice a day  0  . TRAVATAN Z 0.004 % SOLN ophthalmic solution Place 1 drop into both eyes.   0   No current facility-administered medications for this visit.      Physical Exam:   BP 116/66 mmHg  Pulse  96  Resp 20  Ht 5' 2.5" (1.588 m)  Wt 224 lb (101.606 kg)  BMI Samantha.29 kg/m2  SpO2 97%  General:  Obese but well appearing  Chest:   Clear to auscultation  CV:   Regular rate and rhythm  Incisions:  n/a  Abdomen:  Soft and nontender  Extremities:  Warm and well-perfused  Diagnostic Tests:  CT ANGIOGRAPHY CHEST WITH CONTRAST  TECHNIQUE: Multidetector CT imaging of the chest was performed using the standard protocol during bolus administration of intravenous contrast. Multiplanar CT image reconstructions and MIPs were obtained to evaluate the vascular anatomy.  CONTRAST: 100 cc Isovue 370.  COMPARISON: 10/26/2013, 10/10/2012 and 10/18/2011 CT. 12/13/2011 PET-CT.  FINDINGS: Left lobe of thyroid gland lesion appears slightly more complex, septated and minimally larger now measuring 2.2 x 1.6 x 2.3 cm and previously measuring up to 2 cm. Thyroid ultrasound recommended for further delineation.  Post median sternotomy for removal of thymoma. Postoperative changes similar to prior exam without recurrence detected.  Postoperative changes left lung. No worrisome lung parenchymal abnormality noted.  No mediastinal or hilar adenopathy.  Right renal cyst. Mild fatty infiltration of liver. No worrisome upper abdominal abnormality noted.  No osseous destructive lesion.  Atherosclerotic changes thoracic  aorta with mild ectasia. Coronary artery calcifications. Heart size within normal limits.  Review of the MIP images confirms the above findings.  IMPRESSION: Post median sternotomy for removal of thymoma. Postoperative changes similar to prior exam without recurrence detected.  Left lobe of thyroid gland lesion appears slightly more complex, septated and minimally larger now measuring 2.2 x 1.6 x 2.3 cm and previously measuring up to 2 cm. Thyroid ultrasound recommended for further delineation.  Atherosclerotic changes thoracic aorta. Coronary  artery calcifications.   Electronically Signed  By: Genia Del M.D.  On: 10/31/2015 13:30   Impression:  Patient is doing well approximately 4 years status post resection of thymoma.  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 I have personally reviewed her follow-up chest CT scan. Her scan looks good with no sign of local recurrence. The interpreting radiologist did comment on slight change in the appearance of the patient's thyroid gland with slightly larger and more complex appearance of the cystic lesion in the left lobe of the thyroid. Ultrasound has been recommended.  Plan:  We will obtain a baseline TSH level and send the patient for thyroid ultrasound. Otherwise we will plan for the patient to return in 2 years for follow-up CT scan of the chest.  We will defer any subsequent change in the patient's medications to her primary care physician.  I spent in excess of 15 minutes during the conduct of this office consultation and >50% of this time involved direct face-to-face encounter with the patient for counseling and/or coordination of their care.  Valentina Gu. Roxy Manns, MD 10/31/2015 2:16 PM

## 2015-11-01 LAB — TSH: TSH: 1.9 mIU/L

## 2015-11-07 ENCOUNTER — Ambulatory Visit
Admission: RE | Admit: 2015-11-07 | Discharge: 2015-11-07 | Disposition: A | Discharge status: Home / Self Care | Payer: Medicare HMO | Source: Ambulatory Visit | Attending: Thoracic Surgery (Cardiothoracic Vascular Surgery) | Admitting: Thoracic Surgery (Cardiothoracic Vascular Surgery)

## 2015-11-07 DIAGNOSIS — D4989 Neoplasm of unspecified behavior of other specified sites: Secondary | ICD-10-CM

## 2015-11-07 DIAGNOSIS — D150 Benign neoplasm of thymus: Principal | ICD-10-CM

## 2015-12-09 ENCOUNTER — Encounter: Payer: Self-pay | Admitting: *Deleted

## 2016-02-18 ENCOUNTER — Ambulatory Visit (INDEPENDENT_AMBULATORY_CARE_PROVIDER_SITE_OTHER): Payer: Commercial Managed Care - HMO | Admitting: Urgent Care

## 2016-02-18 VITALS — BP 110/60 | HR 91 | Temp 97.6°F | Resp 18 | Ht 61.0 in | Wt 229.4 lb

## 2016-02-18 DIAGNOSIS — Z048 Encounter for examination and observation for other specified reasons: Secondary | ICD-10-CM

## 2016-02-18 DIAGNOSIS — W19XXXA Unspecified fall, initial encounter: Secondary | ICD-10-CM

## 2016-02-18 NOTE — Patient Instructions (Addendum)
Fall Prevention in the Home  Falls can cause injuries and can affect people from all age groups. There are many simple things that you can do to make your home safe and to help prevent falls. WHAT CAN I DO ON THE OUTSIDE OF MY HOME?  Regularly repair the edges of walkways and driveways and fix any cracks.  Remove high doorway thresholds.  Trim any shrubbery on the main path into your home.  Use bright outdoor lighting.  Clear walkways of debris and clutter, including tools and rocks.  Regularly check that handrails are securely fastened and in good repair. Both sides of any steps should have handrails.  Install guardrails along the edges of any raised decks or porches.  Have leaves, snow, and ice cleared regularly.  Use sand or salt on walkways during winter months.  In the garage, clean up any spills right away, including grease or oil spills. WHAT CAN I DO IN THE BATHROOM?  Use night lights.  Install grab bars by the toilet and in the tub and shower. Do not use towel bars as grab bars.  Use non-skid mats or decals on the floor of the tub or shower.  If you need to sit down while you are in the shower, use a plastic, non-slip stool..  Keep the floor dry. Immediately clean up any water that spills on the floor.  Remove soap buildup in the tub or shower on a regular basis.  Attach bath mats securely with double-sided non-slip rug tape.  Remove throw rugs and other tripping hazards from the floor. WHAT CAN I DO IN THE BEDROOM?  Use night lights.  Make sure that a bedside light is easy to reach.  Do not use oversized bedding that drapes onto the floor.  Have a firm chair that has side arms to use for getting dressed.  Remove throw rugs and other tripping hazards from the floor. WHAT CAN I DO IN THE KITCHEN?   Clean up any spills right away.  Avoid walking on wet floors.  Place frequently used items in easy-to-reach places.  If you need to reach for something  above you, use a sturdy step stool that has a grab bar.  Keep electrical cables out of the way.  Do not use floor polish or wax that makes floors slippery. If you have to use wax, make sure that it is non-skid floor wax.  Remove throw rugs and other tripping hazards from the floor. WHAT CAN I DO IN THE STAIRWAYS?  Do not leave any items on the stairs.  Make sure that there are handrails on both sides of the stairs. Fix handrails that are broken or loose. Make sure that handrails are as long as the stairways.  Check any carpeting to make sure that it is firmly attached to the stairs. Fix any carpet that is loose or worn.  Avoid having throw rugs at the top or bottom of stairways, or secure the rugs with carpet tape to prevent them from moving.  Make sure that you have a light switch at the top of the stairs and the bottom of the stairs. If you do not have them, have them installed. WHAT ARE SOME OTHER FALL PREVENTION TIPS?  Wear closed-toe shoes that fit well and support your feet. Wear shoes that have rubber soles or low heels.  When you use a stepladder, make sure that it is completely opened and that the sides are firmly locked. Have someone hold the ladder while you   are using it. Do not climb a closed stepladder.  Add color or contrast paint or tape to grab bars and handrails in your home. Place contrasting color strips on the first and last steps.  Use mobility aids as needed, such as canes, walkers, scooters, and crutches.  Turn on lights if it is dark. Replace any light bulbs that burn out.  Set up furniture so that there are clear paths. Keep the furniture in the same spot.  Fix any uneven floor surfaces.  Choose a carpet design that does not hide the edge of steps of a stairway.  Be aware of any and all pets.  Review your medicines with your healthcare provider. Some medicines can cause dizziness or changes in blood pressure, which increase your risk of falling. Talk  with your health care provider about other ways that you can decrease your risk of falls. This may include working with a physical therapist or trainer to improve your strength, balance, and endurance.   This information is not intended to replace advice given to you by your health care provider. Make sure you discuss any questions you have with your health care provider.   Document Released: 08/17/2002 Document Revised: 01/11/2015 Document Reviewed: 10/01/2014 Elsevier Interactive Patient Education 2016 [employer].     IF you received an x-ray today, you will receive an invoice from [facility]. Please contact [facility] at [phone] with questions or concerns regarding your invoice.   IF you received labwork today, you will receive an invoice from [employer]. Please contact [facility] at [phone] with questions or concerns regarding your invoice.   Our billing staff will not be able to assist you with questions regarding bills from these companies.  You will be contacted with the lab results as soon as they are available. The fastest way to get your results is to activate your My Chart account. Instructions are located on the last page of this paperwork. If you have not heard from [place] regarding the results in 2 weeks, please contact this office.

## 2016-02-18 NOTE — Progress Notes (Signed)
    MRN: [id number] DOB: [birth year]  Subjective:   Samantha [name] is a 70 y.o. female presenting for chief complaint of Fall  Reports falling out of her bed last night. Patient was asleep and believes she kept rolling to one side until she fell from her bed about 3 feet high. She laid on the floor for ~5 minutes before she got back into bed and fell asleep without any further issues. Denies loc, dizziness, confusion, blurred vision, neck pain, chest pain, n/v, abdominal pain, back pain, swelling, bruising, numbness or tingling, headache.  Samantha [name] has a current medication list which includes the following prescription(s): accu-chek aviva plus, accu-chek fastclix lancets, aspirin, cholecalciferol, glipizide, loratadine, metformin, rosuvastatin, travatan z, triamcinolone cream, valsartan-hydrochlorothiazide, and voltaren. Also is allergic to lipitor and lisinopril.  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 Also  has past surgical history that includes Kelman phacoemulsification cataract (2011); Mediasternotomy (01/23/2012); and Wedge resection (01/23/2012).  Objective:   Vitals: BP 110/60 mmHg  Pulse 91  Temp(Src) 97.6 F (36.4 C) (Oral)  Resp 18  Ht 5\' 1"  (1.549 m)  Wt 229 lb 6 oz (104.044 kg)  BMI 43.36 kg/m2  SpO2 99%  Physical Exam  Constitutional: She is oriented to person, place, and time. She appears well-developed and well-nourished.  HENT:  TM's intact bilaterally, no effusions or erythema. Nasal turbinates pink and moist, nasal passages patent. Oropharynx clear, mucous membranes moist.  Eyes: Conjunctivae and EOM are normal. Pupils are equal, round, and reactive to light. Right eye exhibits no discharge. Left eye exhibits no discharge. No scleral icterus.    Neck: Normal range of motion. Neck supple.  Cardiovascular: Normal rate, regular rhythm and intact distal pulses.  Exam reveals no gallop and no friction rub.   No murmur heard. Pulmonary/Chest: No respiratory distress. She has no wheezes. She has no rales.  Musculoskeletal: Normal range of motion. She exhibits no edema or tenderness.  Strength 5/5.  Neurological: She is alert and oriented to person, place, and time. She has normal reflexes. No cranial nerve deficit.  Skin: Skin is warm and dry.  Psychiatric: She has a normal mood and affect.   Assessment and Plan :   1. Fall, initial encounter - Physical exam findings very reassuring. Counseled patient and her daughter on fall prevention. Also discussed signs and symptoms of concussion. They verbalized understanding and will f/u as needed.  Jaynee Eagles, PA-C [facility] [phone] 02/18/2016 11:16 AM

## 2016-05-09 ENCOUNTER — Encounter: Payer: Self-pay | Admitting: Podiatry

## 2016-05-09 ENCOUNTER — Telehealth: Payer: Self-pay | Admitting: *Deleted

## 2016-05-09 ENCOUNTER — Ambulatory Visit (INDEPENDENT_AMBULATORY_CARE_PROVIDER_SITE_OTHER): Payer: Medicare HMO | Admitting: Podiatry

## 2016-05-09 VITALS — BP 118/68 | HR 87 | Resp 18

## 2016-05-09 DIAGNOSIS — M79674 Pain in right toe(s): Secondary | ICD-10-CM

## 2016-05-09 DIAGNOSIS — M79675 Pain in left toe(s): Secondary | ICD-10-CM

## 2016-05-09 DIAGNOSIS — Z8679 Personal history of other diseases of the circulatory system: Secondary | ICD-10-CM | POA: Diagnosis not present

## 2016-05-09 DIAGNOSIS — L84 Corns and callosities: Secondary | ICD-10-CM

## 2016-05-09 DIAGNOSIS — B351 Tinea unguium: Secondary | ICD-10-CM

## 2016-05-09 NOTE — Progress Notes (Signed)
   Subjective:    Patient ID: Samantha [name], female    DOB: May 03, 1946, 70 y.o.   MRN: [id number]  HPI     Presents today stating that she is a diabetic and is a foot examination because her feet are sore and tender, burn, throbbing on and off weightbearing. The symptoms gradually worsened over the last 6 months. Patient also states that when she walks 10 minutes she has cramping in her left calf and has stopped because of calf cramping. Also, patient is requesting that her toenails trimmed as 0 comfortable. The last visit in our office was on 12/02/2013.  Patient is diabetic and denies any history of foot ulceration, history of claudication left calf and denies any amputation  Review of Systems  All other systems reviewed and are negative.      Objective:   Physical Exam  Orientated 3  Vascular: No calf edema or calf tenderness bilaterally DP pulses 2/4 bilaterally PT pulses 1/4 bilaterally Capillary reflex within normal limits bilaterally  Neurological: Sensation to 10 g monofilament wire intact 2/5 right and 3/5 left Vibratory sensation reactive bilaterally Ankle reflexes equal and reactive bilaterally  Dermatological: No open skin lesions bilaterally The toenails elongated, incurvated, discolored and tender to direct palpation 6-10 Plantar callus fifth MPJ bilaterally  Musculoskeletal: No restriction ankle, subtalar, midtarsal joints bilaterally Hammertoe 2-3 bilaterally Dorsi flexion, plantar flexion, inversion, eversion 5/5 bilaterally       Assessment & Plan:   Assessment: Diabetic with decrease pedal pulses and symptoms of claudication left calf Reduce protective sensation bilaterally 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

## 2016-05-09 NOTE — Telephone Encounter (Addendum)
-----   Message from Roney Jaffe, RN sent at 05/09/2016 11:52 AM EDT ----- Orders are in for LE arterial duplex, indication of claudication symptoms left calf. Orders faxed to [facility] they are affiliated with Dr. Halford Chessman, pt cardiothoraic doctor.

## 2016-05-09 NOTE — Patient Instructions (Signed)
Today your complaining of cramping after walking 10 minutes in your left calf. Will order lower extremity arterial Doppler to evaluate his circulation right and left legs and will notify you of the results of this examination  Diabetes and Foot Care Diabetes may cause you to have problems because of poor blood supply (circulation) to your feet and legs. This may cause the skin on your feet to become thinner, break easier, and heal more slowly. Your skin may become dry, and the skin may peel and crack. You may also have nerve damage in your legs and feet causing decreased feeling in them. You may not notice minor injuries to your feet that could lead to infections or more serious problems. Taking care of your feet is one of the most important things you can do for yourself.  HOME CARE INSTRUCTIONS  Wear shoes at all times, even in the house. Do not go barefoot. Bare feet are easily injured.  Check your feet daily for blisters, cuts, and redness. If you cannot see the bottom of your feet, use a mirror or ask someone for help.  Wash your feet with warm water (do not use hot water) and mild soap. Then pat your feet and the areas between your toes until they are completely dry. Do not soak your feet as this can dry your skin.  Apply a moisturizing lotion or petroleum jelly (that does not contain alcohol and is unscented) to the skin on your feet and to dry, brittle toenails. Do not apply lotion between your toes.  Trim your toenails straight across. Do not dig under them or around the cuticle. File the edges of your nails with an emery board or nail file.  Do not cut corns or calluses or try to remove them with medicine.  Wear clean socks or stockings every day. Make sure they are not too tight. Do not wear knee-high stockings since they may decrease blood flow to your legs.  Wear shoes that fit properly and have enough cushioning. To break in new shoes, wear them for just a few hours a day. This  prevents you from injuring your feet. Always look in your shoes before you put them on to be sure there are no objects inside.  Do not cross your legs. This may decrease the blood flow to your feet.  If you find a minor scrape, cut, or break in the skin on your feet, keep it and the skin around it clean and dry. These areas may be cleansed with mild soap and water. Do not cleanse the area with peroxide, alcohol, or iodine.  When you remove an adhesive bandage, be sure not to damage the skin around it.  If you have a wound, look at it several times a day to make sure it is healing.  Do not use heating pads or hot water bottles. They may burn your skin. If you have lost feeling in your feet or legs, you may not know it is happening until it is too late.  Make sure your health care provider performs a complete foot exam at least annually or more often if you have foot problems. Report any cuts, sores, or bruises to your health care provider immediately. SEEK MEDICAL CARE IF:   You have an injury that is not healing.  You have cuts or breaks in the skin.  You have an ingrown nail.  You notice redness on your legs or feet.  You feel burning or tingling in your  legs or feet.  You have pain or cramps in your legs and feet.  Your legs or feet are numb.  Your feet always feel cold. SEEK IMMEDIATE MEDICAL CARE IF:   There is increasing redness, swelling, or pain in or around a wound.  There is a red line that goes up your leg.  Pus is coming from a wound.  You develop a fever or as directed by your health care provider.  You notice a bad smell coming from an ulcer or wound.   This information is not intended to replace advice given to you by your health care provider. Make sure you discuss any questions you have with your health care provider.   Document Released: 08/24/2000 Document Revised: 04/29/2013 Document Reviewed: 02/03/2013 Elsevier Interactive Patient Education [employer]

## 2016-05-10 ENCOUNTER — Telehealth: Payer: Self-pay | Admitting: *Deleted

## 2016-05-10 DIAGNOSIS — I739 Peripheral vascular disease, unspecified: Secondary | ICD-10-CM

## 2016-05-10 NOTE — Telephone Encounter (Signed)
Rip Harbour - [facility] states their accreditation requires they also get ABI with Arterial duplex, needs orders and next available appt for is 05/22/2016 is that okay. Informed Melinda - ABI with TBI orders were in and the 05/22/2016 appt was good. Rip Harbour states good and she will contact the pt.

## 2016-05-22 ENCOUNTER — Ambulatory Visit (HOSPITAL_COMMUNITY): Admission: RE | Admit: 2016-05-22 | Payer: Medicare HMO | Source: Ambulatory Visit

## 2016-05-22 ENCOUNTER — Ambulatory Visit (HOSPITAL_COMMUNITY): Payer: Medicare HMO

## 2016-06-12 ENCOUNTER — Ambulatory Visit (HOSPITAL_COMMUNITY)
Admission: RE | Admit: 2016-06-12 | Discharge: 2016-06-12 | Disposition: A | Discharge status: Home / Self Care | Payer: Medicare HMO | Source: Ambulatory Visit | Attending: Podiatry | Admitting: Podiatry

## 2016-06-12 DIAGNOSIS — Z8679 Personal history of other diseases of the circulatory system: Secondary | ICD-10-CM

## 2016-06-12 DIAGNOSIS — I10 Essential (primary) hypertension: Secondary | ICD-10-CM | POA: Diagnosis not present

## 2016-06-12 DIAGNOSIS — M79674 Pain in right toe(s): Secondary | ICD-10-CM | POA: Diagnosis not present

## 2016-06-12 DIAGNOSIS — I739 Peripheral vascular disease, unspecified: Secondary | ICD-10-CM

## 2016-06-12 DIAGNOSIS — L84 Corns and callosities: Secondary | ICD-10-CM

## 2016-06-12 DIAGNOSIS — Z87891 Personal history of nicotine dependence: Secondary | ICD-10-CM | POA: Diagnosis not present

## 2016-06-12 DIAGNOSIS — M79675 Pain in left toe(s): Secondary | ICD-10-CM | POA: Diagnosis not present

## 2016-06-12 DIAGNOSIS — B351 Tinea unguium: Secondary | ICD-10-CM

## 2016-06-12 DIAGNOSIS — E119 Type 2 diabetes mellitus without complications: Secondary | ICD-10-CM | POA: Insufficient documentation

## 2016-06-27 ENCOUNTER — Ambulatory Visit: Payer: Medicare HMO | Admitting: Podiatry

## 2016-07-03 ENCOUNTER — Ambulatory Visit (INDEPENDENT_AMBULATORY_CARE_PROVIDER_SITE_OTHER): Payer: Medicare HMO | Admitting: Podiatry

## 2016-07-03 ENCOUNTER — Encounter: Payer: Self-pay | Admitting: Podiatry

## 2016-07-03 VITALS — BP 138/80 | HR 103 | Resp 14

## 2016-07-03 DIAGNOSIS — M79671 Pain in right foot: Secondary | ICD-10-CM

## 2016-07-03 DIAGNOSIS — M79672 Pain in left foot: Secondary | ICD-10-CM

## 2016-07-03 DIAGNOSIS — E1142 Type 2 diabetes mellitus with diabetic polyneuropathy: Secondary | ICD-10-CM

## 2016-07-03 NOTE — Progress Notes (Signed)
   Subjective:    Patient ID: Samantha [name], female    DOB: July 10, 1946, 70 y.o.   MRN: [id number]  HPI This patient presents today discuss results of vascular studies that were ordered on the visit of 05/09/2016 at that visit patient was referred for lower extremity arterial Doppler bilaterally  These tests were performed on the visit of 06/12/2016. Patient still continues to complain of generalized burning, throbbing pain on and off wearing bearing   Review of Systems  All other systems reviewed and are negative.      Objective:   Physical Exam   Vascular: No calf edema or calf tenderness bilaterally DP pulses 2/4 bilaterally PT pulses 1/4 bilaterally Capillary reflex within normal limits bilaterally  Neurological: Sensation to 10 g monofilament wire intact 2/5 right and 3/5 left Vibratory sensation reactive bilaterally Ankle reflexes equal and reactive bilaterally  Dermatological: No open skin lesions bilaterally  Musculoskeletal: No restriction ankle, subtalar, midtarsal joints bilaterally Hammertoe 2-3 bilaterally Dorsi flexion, plantar flexion, inversion, eversion 5/5 bilaterally  Results of lower extremity arterial duplex evaluation dated 06/12/2016 No hemodynamically significant stenosis of left lower extremity. Doppler velocities suggest triphasic waveforms throughout the left leg except deep femoral artery and the anterior tibial artery indicate biphasic waveforms Results of lower arterial second report 06/12/2016 No evidence of significant right lower extremity arterial disease based on the right ankle-brachial index 1.2 No evidence of significant lower extremity arterial disease based on left ankle radial index 1.2 The toe brachial toe indices are normal right equals 0.98 left equals 0.81    Assessment & Plan:   Assessment: Satisfactory vascular status Symptoms most consistent with peripheral neuropathy  Plan: Today I reviewed the results of the  vascular exam with patient today in detail making her aware that the results were adequate and no follow-up needed asymmetry vascular status Make patient wear the symptoms of burning and throbbing are most consistent with diabetic neuropathy. Encouraged patient to wear athletic lace a story shoes and return at three-month intervals for nail debridement. If patient still continues to complain of burning will consider prescription of gabapentin  Reappoint at three-month intervals for nail debridement or sooner if patient has concern

## 2016-07-03 NOTE — Patient Instructions (Signed)
The results of your arterial circulation test dated 06/12/2016 or within normal limits meeting that the flow in your legs and feet are normal in no follow-up needed The results of the circulation tests for possible clot in your left leg was normal in no follow-up needed Return every 3-4 months for skin a nail debridement Wear sturdy athletic style shoes to give your feet additional support 7 of the discomfort in your feet may be from diabetes affecting your nerves  Diabetes and Foot Care Diabetes may cause you to have problems because of poor blood supply (circulation) to your feet and legs. This may cause the skin on your feet to become thinner, break easier, and heal more slowly. Your skin may become dry, and the skin may peel and crack. You may also have nerve damage in your legs and feet causing decreased feeling in them. You may not notice minor injuries to your feet that could lead to infections or more serious problems. Taking care of your feet is one of the most important things you can do for yourself.  HOME CARE INSTRUCTIONS  Wear shoes at all times, even in the house. Do not go barefoot. Bare feet are easily injured.  Check your feet daily for blisters, cuts, and redness. If you cannot see the bottom of your feet, use a mirror or ask someone for help.  Wash your feet with warm water (do not use hot water) and mild soap. Then pat your feet and the areas between your toes until they are completely dry. Do not soak your feet as this can dry your skin.  Apply a moisturizing lotion or petroleum jelly (that does not contain alcohol and is unscented) to the skin on your feet and to dry, brittle toenails. Do not apply lotion between your toes.  Trim your toenails straight across. Do not dig under them or around the cuticle. File the edges of your nails with an emery board or nail file.  Do not cut corns or calluses or try to remove them with medicine.  Wear clean socks or stockings every  day. Make sure they are not too tight. Do not wear knee-high stockings since they may decrease blood flow to your legs.  Wear shoes that fit properly and have enough cushioning. To break in new shoes, wear them for just a few hours a day. This prevents you from injuring your feet. Always look in your shoes before you put them on to be sure there are no objects inside.  Do not cross your legs. This may decrease the blood flow to your feet.  If you find a minor scrape, cut, or break in the skin on your feet, keep it and the skin around it clean and dry. These areas may be cleansed with mild soap and water. Do not cleanse the area with peroxide, alcohol, or iodine.  When you remove an adhesive bandage, be sure not to damage the skin around it.  If you have a wound, look at it several times a day to make sure it is healing.  Do not use heating pads or hot water bottles. They may burn your skin. If you have lost feeling in your feet or legs, you may not know it is happening until it is too late.  Make sure your health care provider performs a complete foot exam at least annually or more often if you have foot problems. Report any cuts, sores, or bruises to your health care provider immediately. [facility]  CARE IF:   You have an injury that is not healing.  You have cuts or breaks in the skin.  You have an ingrown nail.  You notice redness on your legs or feet.  You feel burning or tingling in your legs or feet.  You have pain or cramps in your legs and feet.  Your legs or feet are numb.  Your feet always feel cold. SEEK IMMEDIATE MEDICAL CARE IF:   There is increasing redness, swelling, or pain in or around a wound.  There is a red line that goes up your leg.  Pus is coming from a wound.  You develop a fever or as directed by your health care provider.  You notice a bad smell coming from an ulcer or wound.   This information is not intended to replace advice given to you by  your health care provider. Make sure you discuss any questions you have with your health care provider.   Document Released: 08/24/2000 Document Revised: 04/29/2013 Document Reviewed: 02/03/2013 Elsevier Interactive Patient Education [employer].

## 2016-10-03 ENCOUNTER — Ambulatory Visit (INDEPENDENT_AMBULATORY_CARE_PROVIDER_SITE_OTHER): Payer: Medicare HMO | Admitting: Podiatry

## 2016-10-03 ENCOUNTER — Encounter: Payer: Self-pay | Admitting: Podiatry

## 2016-10-03 VITALS — BP 124/65 | HR 90 | Resp 18

## 2016-10-03 DIAGNOSIS — E1142 Type 2 diabetes mellitus with diabetic polyneuropathy: Secondary | ICD-10-CM

## 2016-10-03 DIAGNOSIS — M79674 Pain in right toe(s): Secondary | ICD-10-CM

## 2016-10-03 DIAGNOSIS — B351 Tinea unguium: Secondary | ICD-10-CM

## 2016-10-03 DIAGNOSIS — L84 Corns and callosities: Secondary | ICD-10-CM | POA: Diagnosis not present

## 2016-10-03 DIAGNOSIS — M79675 Pain in left toe(s): Secondary | ICD-10-CM

## 2016-10-03 NOTE — Patient Instructions (Signed)

## 2016-10-03 NOTE — Progress Notes (Signed)
Patient ID: Samantha [name], female   DOB: [birth year], 71 y.o.   MRN: [id number]   Subjective: This patient presents for a scheduled visit complaining of uncomfortable toenails walking wearing shoes and uncomfortable plantar calluses and is requesting skin a nail debridement  Objective:Vascular: No calf edema or calf tenderness bilaterally DP pulses 2/4 bilaterally PT pulses 1/4 bilaterally Capillary reflex within normal limits bilaterally  Neurological: Sensation to 10 g monofilament wire intact 2/5 right and 3/5 left Vibratory sensation reactive bilaterally Ankle reflexes equal and reactive bilaterally  Dermatological: No open skin lesions bilaterally Plantar calluses sub-fifth MPJ bilaterally  Musculoskeletal: No restriction ankle, subtalar, midtarsal joints bilaterally Hammertoe 2-3 bilaterally Dorsi flexion, plantar flexion, inversion, eversion 5/5 bilaterally  Results of lower extremity arterial duplex evaluation dated 06/12/2016 No hemodynamically significant stenosis of left lower extremity. Doppler velocities suggest triphasic waveforms throughout the left leg except deep femoral artery and the anterior tibial artery indicate biphasic waveforms Results of lower arterial second report 06/12/2016 No evidence of significant right lower extremity arterial disease based on the right ankle-brachial index 1.2 No evidence of significant lower extremity arterial disease based on left ankle radial index 1.2 The toe brachial toe indices are normal right equals 0.98 left equals 0.81  Assessment: Diabetic with satisfactory mildly reduced vascular status Diabetic peripheral neuropathy Symptomatic mycotic toenails 6-10 Plantar calluses 2  Plan: Debridement of toenails 6-10 mechanically and electronically without any bleeding Debridement plantar calluses 2 without any bleeding  Reappoint 3 months

## 2017-01-02 ENCOUNTER — Ambulatory Visit (INDEPENDENT_AMBULATORY_CARE_PROVIDER_SITE_OTHER): Payer: Medicare HMO | Admitting: Podiatry

## 2017-01-02 DIAGNOSIS — B351 Tinea unguium: Secondary | ICD-10-CM | POA: Diagnosis not present

## 2017-01-02 DIAGNOSIS — M79675 Pain in left toe(s): Secondary | ICD-10-CM

## 2017-01-02 DIAGNOSIS — M79674 Pain in right toe(s): Secondary | ICD-10-CM

## 2017-01-02 DIAGNOSIS — L84 Corns and callosities: Secondary | ICD-10-CM | POA: Diagnosis not present

## 2017-01-02 DIAGNOSIS — R0989 Other specified symptoms and signs involving the circulatory and respiratory systems: Secondary | ICD-10-CM

## 2017-01-02 DIAGNOSIS — E1142 Type 2 diabetes mellitus with diabetic polyneuropathy: Secondary | ICD-10-CM | POA: Diagnosis not present

## 2017-01-02 NOTE — Patient Instructions (Signed)

## 2017-01-02 NOTE — Progress Notes (Signed)
Patient ID: Samantha [name], female   DOB: 1946-02-14, 71 y.o.   MRN: [id number]    Subjective: This patient presents for a scheduled visit complaining of uncomfortable toenails walking wearing shoes and uncomfortable plantar calluses and is requesting skin a nail debridement  Objective:Vascular: No calf edema or calf tenderness bilaterally DP pulses 2/4 bilaterally PT pulses 1/4 bilaterally Capillary reflex within normal limits bilaterally  Neurological: Sensation to 10 g monofilament wire intact 2/5 right and 3/5 left Vibratory sensation reactive bilaterally Ankle reflexes equal and reactive bilaterally  Dermatological: No open skin lesions bilaterally Plantar calluses sub-fifth MPJ bilaterally Absent hair growth bilaterally  Musculoskeletal: No restriction ankle, subtalar, midtarsal joints bilaterally Hammertoe 2-3 bilaterally Dorsi flexion, plantar flexion, inversion, eversion 5/5 bilaterally  Results of lower extremity arterial duplex evaluation dated 06/12/2016 No hemodynamically significant stenosis of left lower extremity. Doppler velocities suggest triphasic waveforms throughout the left leg except deep femoral artery and the anterior tibial artery indicate biphasic waveforms Results of lower arterial second report 06/12/2016 No evidence of significant right lower extremity arterial disease based on the right ankle-brachial index 1.2 No evidence of significant lower extremity arterial disease based on left ankle radial index 1.2 The toe brachial toe indices are normal right equals 0.98 left equals 0.81  Assessment: Diabetic with satisfactory mildly reduced vascular status , decrease pedal pulses Diabetic peripheral neuropathy Symptomatic mycotic toenails 6-10 Plantar calluses 2  Plan: Debridement of toenails 6-10 mechanically and electronically without any bleeding Debridement plantar calluses 2 without any bleeding  Reappoint 3 months

## 2017-04-10 ENCOUNTER — Encounter: Payer: Self-pay | Admitting: Podiatry

## 2017-04-10 ENCOUNTER — Ambulatory Visit (INDEPENDENT_AMBULATORY_CARE_PROVIDER_SITE_OTHER): Payer: Medicare HMO | Admitting: Podiatry

## 2017-04-10 VITALS — BP 154/87 | HR 52 | Resp 18

## 2017-04-10 DIAGNOSIS — M79674 Pain in right toe(s): Secondary | ICD-10-CM

## 2017-04-10 DIAGNOSIS — L84 Corns and callosities: Secondary | ICD-10-CM

## 2017-04-10 DIAGNOSIS — M79675 Pain in left toe(s): Secondary | ICD-10-CM | POA: Diagnosis not present

## 2017-04-10 DIAGNOSIS — B351 Tinea unguium: Secondary | ICD-10-CM | POA: Diagnosis not present

## 2017-04-10 DIAGNOSIS — E1142 Type 2 diabetes mellitus with diabetic polyneuropathy: Secondary | ICD-10-CM

## 2017-04-10 NOTE — Progress Notes (Signed)
Patient ID: Samantha [name], female   DOB: 11-28-1945, 71 y.o.   MRN: [id number]    Subjective: This patient presents for a scheduled visit complaining of uncomfortable toenails walking wearing shoes and uncomfortable plantar calluses and is requesting skin a nail debridement  Objective:Vascular: No calf edema or calf tenderness bilaterally DP pulses 2/4 bilaterally PT pulses 1/4 bilaterally Capillary reflex within normal limits bilaterally  Neurological: Sensation to 10 g monofilament wire intact 2/5 right and 3/5 left Vibratory sensation reactive bilaterally Ankle reflexes equal and reactive bilaterally  Dermatological: No open skin lesions bilaterally Plantar calluses sub-fifth MPJ bilaterally Absent hair growth with atrophic skin, bilaterally  Musculoskeletal: No restriction ankle, subtalar, midtarsal joints bilaterally Hammertoe 2-3 bilaterally Dorsi flexion, plantar flexion, inversion, eversion 5/5 bilaterally  Results of lower extremity arterial duplex evaluation dated 06/12/2016 No hemodynamically significant stenosis of left lower extremity. Doppler velocities suggest triphasic waveforms throughout the left leg except deep femoral artery and the anterior tibial artery indicate biphasic waveforms Results of lower arterial second report 06/12/2016 No evidence of significant right lower extremity arterial disease based on the right ankle-brachial index 1.2 No evidence of significant lower extremity arterial disease based on left ankle radial index 1.2 The toe brachial toe indices are normal right equals 0.98 left equals 0.81  Assessment: Diabetic with satisfactory mildly reduced vascular status , decrease pedal pulses Diabetic peripheral neuropathy Symptomatic mycotic toenails 6-10 Plantar calluses 2  Plan: Debridement of toenails 6-10 mechanically and electronically without anybleeding Debridement plantar calluses 2 without any bleeding  Reappoint 3  months

## 2017-04-10 NOTE — Patient Instructions (Signed)

## 2017-07-16 ENCOUNTER — Other Ambulatory Visit: Payer: Self-pay | Admitting: Family

## 2017-07-16 ENCOUNTER — Ambulatory Visit: Payer: Medicare HMO | Admitting: Podiatry

## 2017-07-16 DIAGNOSIS — Z1231 Encounter for screening mammogram for malignant neoplasm of breast: Secondary | ICD-10-CM

## 2017-08-06 ENCOUNTER — Encounter: Payer: Self-pay | Admitting: Podiatry

## 2017-08-06 ENCOUNTER — Ambulatory Visit: Payer: Medicare HMO | Admitting: Podiatry

## 2017-08-06 DIAGNOSIS — L84 Corns and callosities: Secondary | ICD-10-CM | POA: Diagnosis not present

## 2017-08-06 DIAGNOSIS — B351 Tinea unguium: Secondary | ICD-10-CM

## 2017-08-06 DIAGNOSIS — M79675 Pain in left toe(s): Secondary | ICD-10-CM

## 2017-08-06 DIAGNOSIS — M79674 Pain in right toe(s): Secondary | ICD-10-CM | POA: Diagnosis not present

## 2017-08-06 DIAGNOSIS — E1142 Type 2 diabetes mellitus with diabetic polyneuropathy: Secondary | ICD-10-CM

## 2017-08-06 NOTE — Patient Instructions (Signed)
Removed Band-Aid in the fourth right toe 1-3 days and apply triple antibiotic ointment daily and cover with a Band-Aid until a scab forms  Diabetes and Foot Care Diabetes may cause you to have problems because of poor blood supply (circulation) to your feet and legs. This may cause the skin on your feet to become thinner, break easier, and heal more slowly. Your skin may become dry, and the skin may peel and crack. You may also have nerve damage in your legs and feet causing decreased feeling in them. You may not notice minor injuries to your feet that could lead to infections or more serious problems. Taking care of your feet is one of the most important things you can do for yourself. Follow these instructions at home:  Wear shoes at all times, even in the house. Do not go barefoot. Bare feet are easily injured.  Check your feet daily for blisters, cuts, and redness. If you cannot see the bottom of your feet, use a mirror or ask someone for help.  Wash your feet with warm water (do not use hot water) and mild soap. Then pat your feet and the areas between your toes until they are completely dry. Do not soak your feet as this can dry your skin.  Apply a moisturizing lotion or petroleum jelly (that does not contain alcohol and is unscented) to the skin on your feet and to dry, brittle toenails. Do not apply lotion between your toes.  Trim your toenails straight across. Do not dig under them or around the cuticle. File the edges of your nails with an emery board or nail file.  Do not cut corns or calluses or try to remove them with medicine.  Wear clean socks or stockings every day. Make sure they are not too tight. Do not wear knee-high stockings since they may decrease blood flow to your legs.  Wear shoes that fit properly and have enough cushioning. To break in new shoes, wear them for just a few hours a day. This prevents you from injuring your feet. Always look in your shoes before you put  them on to be sure there are no objects inside.  Do not cross your legs. This may decrease the blood flow to your feet.  If you find a minor scrape, cut, or break in the skin on your feet, keep it and the skin around it clean and dry. These areas may be cleansed with mild soap and water. Do not cleanse the area with peroxide, alcohol, or iodine.  When you remove an adhesive bandage, be sure not to damage the skin around it.  If you have a wound, look at it several times a day to make sure it is healing.  Do not use heating pads or hot water bottles. They may burn your skin. If you have lost feeling in your feet or legs, you may not know it is happening until it is too late.  Make sure your health care provider performs a complete foot exam at least annually or more often if you have foot problems. Report any cuts, sores, or bruises to your health care provider immediately. Contact a health care provider if:  You have an injury that is not healing.  You have cuts or breaks in the skin.  You have an ingrown nail.  You notice redness on your legs or feet.  You feel burning or tingling in your legs or feet.  You have pain or cramps in your  legs and feet.  Your legs or feet are numb.  Your feet always feel cold. Get help right away if:  There is increasing redness, swelling, or pain in or around a wound.  There is a red line that goes up your leg.  Pus is coming from a wound.  You develop a fever or as directed by your health care provider.  You notice a bad smell coming from an ulcer or wound. This information is not intended to replace advice given to you by your health care provider. Make sure you discuss any questions you have with your health care provider. Document Released: 08/24/2000 Document Revised: 02/02/2016 Document Reviewed: 02/03/2013 Elsevier Interactive Patient Education  2017 [employer].

## 2017-08-06 NOTE — Progress Notes (Signed)
Patient ID: Samantha [name], female   DOB: [birth year], 71 y.o.   MRN: [id number]    Subjective: This patient presents for a scheduled visit complaining of uncomfortable toenails walking wearing shoes and uncomfortable plantar calluses and is requesting skin a nail debridement  Objective:Vascular: No calf edema or calf tenderness bilaterally DP pulses 2/4 bilaterally PT pulses 1/4 bilaterally Capillary reflex within normal limits bilaterally  Neurological: Sensation to 10 g monofilament wire intact 2/5 right and 3/5 left Vibratory sensation reactive bilaterally Ankle reflexes equal and reactive bilaterally  Dermatological: No open skin lesions bilaterally Plantar calluses sub-fifth MPJ bilaterally Absent hair growth with atrophic skin, bilaterally  Musculoskeletal: No restriction ankle, subtalar, midtarsal joints bilaterally Hammertoe 2-3 bilaterally Dorsi flexion, plantar flexion, inversion, eversion 5/5 bilaterally  Results of lower extremity arterial duplex evaluation dated 06/12/2016 No hemodynamically significant stenosis of left lower extremity. Doppler velocities suggest triphasic waveforms throughout the left leg except deep femoral artery and the anterior tibial artery indicate biphasic waveforms Results of lower arterial second report 06/12/2016 No evidence of significant right lower extremity arterial disease based on the right ankle-brachial index 1.2 No evidence of significant lower extremity arterial disease based on left ankle radial index 1.2 The toe brachial toe indices are normal right equals 0.98 left equals 0.81  Assessment: Diabetic with satisfactory mildly reduced vascular status ,decrease pedal pulses Diabetic peripheral neuropathy Symptomatic mycotic toenails 6-10 Plantar calluses 2  Plan: Debridement of toenails 6-10 mechanically and electronically with slight bleeding fourth right toe, treated with topical antibiotic ointment and Band-Aid.  Patient instructed removed Band-Aid 1-3 days and continue applying topical antibiotic ointment and Band-Aid until a scab forms Debridement plantar calluses 2 without any bleeding  Reappoint 3 months

## 2017-08-15 ENCOUNTER — Ambulatory Visit: Payer: Medicare HMO

## 2017-09-27 ENCOUNTER — Other Ambulatory Visit: Payer: Self-pay | Admitting: *Deleted

## 2017-09-27 DIAGNOSIS — D150 Benign neoplasm of thymus: Principal | ICD-10-CM

## 2017-09-27 DIAGNOSIS — D4989 Neoplasm of unspecified behavior of other specified sites: Secondary | ICD-10-CM

## 2017-11-04 ENCOUNTER — Other Ambulatory Visit: Payer: Medicare HMO

## 2017-11-04 ENCOUNTER — Ambulatory Visit: Payer: Medicare HMO | Admitting: Thoracic Surgery (Cardiothoracic Vascular Surgery)

## 2017-11-04 ENCOUNTER — Ambulatory Visit
Admission: RE | Admit: 2017-11-04 | Discharge: 2017-11-04 | Disposition: A | Discharge status: Home / Self Care | Payer: Medicare HMO | Source: Ambulatory Visit | Attending: Thoracic Surgery (Cardiothoracic Vascular Surgery) | Admitting: Thoracic Surgery (Cardiothoracic Vascular Surgery)

## 2017-11-04 ENCOUNTER — Encounter: Payer: Self-pay | Admitting: Thoracic Surgery (Cardiothoracic Vascular Surgery)

## 2017-11-04 VITALS — BP 140/72 | HR 100 | Resp 20 | Ht 61.0 in | Wt 224.0 lb

## 2017-11-04 DIAGNOSIS — Z9089 Acquired absence of other organs: Secondary | ICD-10-CM

## 2017-11-04 DIAGNOSIS — D4989 Neoplasm of unspecified behavior of other specified sites: Secondary | ICD-10-CM

## 2017-11-04 DIAGNOSIS — D150 Benign neoplasm of thymus: Secondary | ICD-10-CM

## 2017-11-04 MED ORDER — IOPAMIDOL (ISOVUE-370) INJECTION 76%
75.0000 mL | Freq: Once | INTRAVENOUS | Status: AC | PRN
  Administered 2017-11-04: 75 mL via INTRAVENOUS

## 2017-11-04 NOTE — Progress Notes (Signed)
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  She was last seen here in our office on 10/31/2015.  Since then she has continued to do well.  She reports no new problems or complaints.  She states that her blood pressure and diabetes have been under fairly good control.   Current Outpatient Medications  Medication Sig Dispense Refill  . ACCU-CHEK AVIVA PLUS test strip     . ACCU-CHEK FASTCLIX LANCETS MISC     . aspirin 81 MG tablet Take 81 mg by mouth daily.      . cholecalciferol (VITAMIN D) 1000 UNITS tablet Take 1,000 Units by mouth daily.    . irbesartan-hydrochlorothiazide (AVALIDE) 150-12.5 MG tablet Take 1 tablet by mouth daily.    Marland Kitchen loratadine (CLARITIN) 10 MG tablet Take 10 mg by mouth daily.      . metFORMIN (GLUCOPHAGE) 1000 MG tablet Take 1,000 mg by mouth 2 (two) times daily with a meal.      . rosuvastatin (CRESTOR) 5 MG tablet     . TRAVATAN Z 0.004 % SOLN ophthalmic solution Place 1 drop into both eyes.   0  . triamcinolone cream (KENALOG) 0.1 % Apply 1 application topically 2 (two) times daily as needed. For irritation    . valsartan-hydrochlorothiazide (DIOVAN-HCT) 160-25 MG tablet Take 1 tablet by mouth daily.  0   No current facility-administered medications for this visit.       Physical Exam:   BP 140/72   Pulse 100   Resp 20   Ht 5\' 1"  (1.549 m)   Wt 224 lb (101.6 kg)   SpO2 99% Comment: RA  BMI 42.32 kg/m   General:  Elderly and obese but well-appearing  Chest:   Clear to auscultation  CV:   Regular rate and  rhythm  Incisions:  n/a  Abdomen:  Soft nontender  Extremities:  Warm and well perfused  Diagnostic Tests:  CT ANGIOGRAPHY CHEST WITH CONTRAST  TECHNIQUE: Multidetector CT imaging of the chest was performed using the standard protocol during bolus administration of intravenous contrast. Multiplanar CT image reconstructions and MIPs were obtained to evaluate the vascular anatomy.  CONTRAST:  40mL ISOVUE-370 IOPAMIDOL (ISOVUE-370) INJECTION 76%  Creatinine was obtained on site at [facility] at [address]  Results: Creatinine 1.2 mg/dL.  COMPARISON:  10/31/2015  FINDINGS: Cardiovascular: Satisfactory opacification of the bilateral pulmonary arteries to the segmental level. Evaluation is mildly constrained due to respiratory motion. Within that constraint, there is no evidence of pulmonary embolism.  No evidence of thoracic aortic aneurysm or dissection. Atherosclerotic calcifications of the aortic arch.  The heart is normal in size.  No pericardial effusion.  Three vessel coronary atherosclerosis.  Mediastinum/Nodes: No evidence of anterior mediastinal mass in this patient status post thymoma resection.  No suspicious mediastinal, hilar, or axillary lymphadenopathy.  Stable 2.2 cm left thyroid nodule.  Lungs/Pleura: Evaluation of the lung parenchyma is mildly constrained by respiratory motion.  Postsurgical changes in the anterior left upper lobe.  No suspicious pulmonary nodules.  No focal consolidation.  No pleural effusion or pneumothorax.  Upper Abdomen: Visualized upper abdomen is unremarkable.  Musculoskeletal: Degenerative changes of the visualized thoracolumbar spine. Median sternotomy.  Review of the MIP images confirms the above findings.  IMPRESSION: No evidence of pulmonary embolism.  Status post thymoma resection. No evidence of recurrent or metastatic disease.  Aortic Atherosclerosis  (ICD10-I70.0).   Electronically Signed   By: Julian Hy M.D.   On: 11/04/2017 10:17    Impression:  Patient is doing well approximately 6 years status post resection of thymoma.  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 I have personally reviewed her follow-up chest CT scan. Her scan looks good with no sign of local recurrence.  Her thyroid nodule also appears stable.  Plan:  At this point I feel that no further long-term surveillance is necessary.  In the future the patient will call and return to see [place] as needed.  All of her questions have been addressed.  I spent in excess of 15 minutes during the conduct of this office consultation and >50% of this time involved direct face-to-face encounter with the patient for counseling and/or coordination of their care.   Valentina Gu. Roxy Manns, MD 11/04/2017 12:04 PM

## 2017-11-04 NOTE — Patient Instructions (Signed)
Continue all previous medications without any changes at this time  

## 2017-11-05 ENCOUNTER — Encounter: Payer: Self-pay | Admitting: Podiatry

## 2017-11-05 ENCOUNTER — Ambulatory Visit: Payer: Medicare HMO | Admitting: Podiatry

## 2017-11-05 DIAGNOSIS — M79675 Pain in left toe(s): Secondary | ICD-10-CM | POA: Diagnosis not present

## 2017-11-05 DIAGNOSIS — Q828 Other specified congenital malformations of skin: Secondary | ICD-10-CM

## 2017-11-05 DIAGNOSIS — M79674 Pain in right toe(s): Secondary | ICD-10-CM

## 2017-11-05 DIAGNOSIS — B351 Tinea unguium: Secondary | ICD-10-CM

## 2017-11-05 DIAGNOSIS — E1142 Type 2 diabetes mellitus with diabetic polyneuropathy: Secondary | ICD-10-CM | POA: Diagnosis not present

## 2017-11-07 NOTE — Progress Notes (Signed)
Subjective: 72 y.o. returns the office today for painful, elongated, thickened toenails which she cannot trim herself. Denies any redness or drainage around the nails.  She also has calluses that she like to have trimmed.  Denies any acute changes since last appointment and no new complaints today. Denies any systemic complaints such as fevers, chills, nausea, vomiting.   PCP: Willey Blade, MD  Objective: AAO 3, NAD- she is very nervous today  She states that she was caught last time the nails are being trimmed.  She does not with the nails cut short.  She healed uneventfully. DP/PT pulses palpable, CRT less than 3 seconds Sensation decreased with Simms Weinstein monofilament. Nails hypertrophic, dystrophic, elongated, brittle, discolored 10. There is tenderness overlying the nails 1-5 bilaterally. There is no surrounding erythema or drainage along the nail sites. Hyperkeratotic lesions bilateral submetatarsal 5.  Upon debridement no underlying ulceration, drainage or any signs of infection. No open lesions or pre-ulcerative lesions are identified. No other areas of tenderness bilateral lower extremities. No overlying edema, erythema, increased warmth. No pain with calf compression, swelling, warmth, erythema.  Assessment: Patient presents with symptomatic onychomycosis, hyperkeratotic lesions  Plan: -Treatment options including alternatives, risks, complications were discussed -Nails sharply debrided 10 without complication/bleeding. -Hyperkeratotic lesion Sharpy debrided x2 without any complications or bleeding. -Discussed daily foot inspection. If there are any changes, to call the office immediately.  -Follow-up in 3 months or sooner if any problems are to arise. In the meantime, encouraged to call the office with any questions, concerns, changes symptoms.  Celesta Gentile, DPM

## 2018-02-04 ENCOUNTER — Ambulatory Visit: Payer: Medicare HMO | Admitting: Podiatry

## 2018-02-04 ENCOUNTER — Encounter: Payer: Self-pay | Admitting: Podiatry

## 2018-02-04 DIAGNOSIS — M79674 Pain in right toe(s): Secondary | ICD-10-CM | POA: Diagnosis not present

## 2018-02-04 DIAGNOSIS — E1142 Type 2 diabetes mellitus with diabetic polyneuropathy: Secondary | ICD-10-CM

## 2018-02-04 DIAGNOSIS — M79675 Pain in left toe(s): Secondary | ICD-10-CM | POA: Diagnosis not present

## 2018-02-04 DIAGNOSIS — M216X9 Other acquired deformities of unspecified foot: Secondary | ICD-10-CM

## 2018-02-04 DIAGNOSIS — Q828 Other specified congenital malformations of skin: Secondary | ICD-10-CM

## 2018-02-04 DIAGNOSIS — B351 Tinea unguium: Secondary | ICD-10-CM | POA: Diagnosis not present

## 2018-02-05 NOTE — Progress Notes (Signed)
Subjective: 72 y.o. returns the office today for painful, elongated, thickened toenails which she cannot trim herself. Denies any redness or drainage around the nails.  She also has calluses that she like to have trimmed.  She is also asking about diabetic shoes to see if this will help take pressure off her feet due to the pain and calluses.  Denies any acute changes since last appointment and no new complaints today. Denies any systemic complaints such as fevers, chills, nausea, vomiting.   PCP: Willey Blade, MD  Objective: AAO 3, NAD-  DP/PT pulses palpable, CRT less than 3 seconds Sensation decreased with Simms Weinstein monofilament. Nails hypertrophic, dystrophic, elongated, brittle, discolored 10. There is tenderness overlying the nails 1-5 bilaterally. There is no surrounding erythema or drainage along the nail sites. Hyperkeratotic lesions bilateral submetatarsal 5.  Upon debridement no underlying ulceration, drainage or any signs of infection.  Prominent metatarsal heads plantarly. No open lesions or pre-ulcerative lesions are identified. No other areas of tenderness bilateral lower extremities. No overlying edema, erythema, increased warmth. No pain with calf compression, swelling, warmth, erythema.  Assessment: Patient presents with symptomatic onychomycosis, hyperkeratotic lesions  Plan: -Treatment options including alternatives, risks, complications were discussed -Nails sharply debrided 10 without complication/bleeding. -Hyperkeratotic lesion Sharpy debrided x2 without any complications or bleeding. -Given hyperkeratotic lesions as well as neuropathy I do think she will benefit from diabetic shoes to help take pressure off the painful lesions.  Paperwork was completed today for precertification. -Discussed daily foot inspection. If there are any changes, to call the office immediately.  -Follow-up in 9 weeks or sooner if any problems are to arise. In the meantime,  encouraged to call the office with any questions, concerns, changes symptoms.  Celesta Gentile, DPM

## 2018-03-04 ENCOUNTER — Ambulatory Visit: Payer: Medicare HMO | Admitting: Orthotics

## 2018-03-04 DIAGNOSIS — M79675 Pain in left toe(s): Secondary | ICD-10-CM

## 2018-03-04 DIAGNOSIS — M79674 Pain in right toe(s): Secondary | ICD-10-CM

## 2018-03-04 DIAGNOSIS — Q828 Other specified congenital malformations of skin: Secondary | ICD-10-CM

## 2018-03-04 DIAGNOSIS — L84 Corns and callosities: Secondary | ICD-10-CM

## 2018-03-04 DIAGNOSIS — E1142 Type 2 diabetes mellitus with diabetic polyneuropathy: Secondary | ICD-10-CM

## 2018-03-04 DIAGNOSIS — M216X9 Other acquired deformities of unspecified foot: Secondary | ICD-10-CM

## 2018-03-05 NOTE — Progress Notes (Signed)

## 2018-04-08 ENCOUNTER — Encounter: Payer: Self-pay | Admitting: Podiatry

## 2018-04-08 ENCOUNTER — Ambulatory Visit: Payer: Medicare HMO | Admitting: Podiatry

## 2018-04-08 DIAGNOSIS — E1142 Type 2 diabetes mellitus with diabetic polyneuropathy: Secondary | ICD-10-CM

## 2018-04-08 DIAGNOSIS — B351 Tinea unguium: Secondary | ICD-10-CM | POA: Diagnosis not present

## 2018-04-08 DIAGNOSIS — Q828 Other specified congenital malformations of skin: Secondary | ICD-10-CM

## 2018-04-08 DIAGNOSIS — M79674 Pain in right toe(s): Secondary | ICD-10-CM | POA: Diagnosis not present

## 2018-04-08 DIAGNOSIS — M79675 Pain in left toe(s): Secondary | ICD-10-CM

## 2018-04-09 NOTE — Progress Notes (Signed)
Subjective: 72 y.o. returns the office today for painful, elongated, thickened toenails which she cannot trim herself. Denies any redness or drainage around the nails.  She also has calluses that she like to have trimmed. Denies any acute changes since last appointment and no new complaints today. Denies any systemic complaints such as fevers, chills, nausea, vomiting.   PCP: Willey Blade, MD  Objective: AAO 3, NAD-  DP/PT pulses palpable, CRT less than 3 seconds Sensation decreased with Simms Weinstein monofilament. Nails hypertrophic, dystrophic, elongated, brittle, discolored 10. There is tenderness overlying the nails 1-5 bilaterally. There is no surrounding erythema or drainage along the nail sites. Hyperkeratotic lesions bilateral submetatarsal 5.  Upon debridement no underlying ulceration, drainage or any signs of infection.  Prominent metatarsal heads plantarly. No open lesions or pre-ulcerative lesions are identified. No other areas of tenderness bilateral lower extremities. No overlying edema, erythema, increased warmth. No pain with calf compression, swelling, warmth, erythema.  Assessment: Patient presents with symptomatic onychomycosis, hyperkeratotic lesions  Plan: -Treatment options including alternatives, risks, complications were discussed -Nails sharply debrided 10 without complication/bleeding. -Hyperkeratotic lesion Sharpy debrided x2 without any complications or bleeding. -Still awaiting diabetic shoes  -Discussed daily foot inspection. If there are any changes, to call the office immediately.  -Follow-up in 9 weeks or sooner if any problems are to arise. In the meantime, encouraged to call the office with any questions, concerns, changes symptoms.  Celesta Gentile, DPM

## 2018-05-27 ENCOUNTER — Telehealth: Payer: Self-pay | Admitting: Podiatry

## 2018-05-27 NOTE — Telephone Encounter (Signed)
Received call from primary care providers office and they only have a NP in the practice and pt is only seen there.I made them aware that pt has to see a md/do per medicare guidelines. She is trying to figure out what they need to do.

## 2018-07-09 ENCOUNTER — Ambulatory Visit (INDEPENDENT_AMBULATORY_CARE_PROVIDER_SITE_OTHER): Payer: Medicare HMO | Admitting: Podiatry

## 2018-07-09 DIAGNOSIS — L84 Corns and callosities: Secondary | ICD-10-CM

## 2018-07-09 DIAGNOSIS — M79675 Pain in left toe(s): Secondary | ICD-10-CM

## 2018-07-09 DIAGNOSIS — E1142 Type 2 diabetes mellitus with diabetic polyneuropathy: Secondary | ICD-10-CM

## 2018-07-09 DIAGNOSIS — B351 Tinea unguium: Secondary | ICD-10-CM | POA: Diagnosis not present

## 2018-07-09 DIAGNOSIS — M79674 Pain in right toe(s): Secondary | ICD-10-CM

## 2018-07-09 NOTE — Patient Instructions (Signed)

## 2018-07-27 ENCOUNTER — Encounter: Payer: Self-pay | Admitting: Podiatry

## 2018-07-27 NOTE — Progress Notes (Signed)
Subjective: Samantha [name] presents today with history of neuropathy with cc of painful, mycotic toenails.  Pain is aggravated when wearing enclosed shoe gear and relieved with periodic professional debridement.  She would like to check the status of her diabetic shoes. She was measured for diabetic shoes in April.  Objective: 72 yo AAF, WD, WN in NAD. AAO x 3. Vascular Examination: Capillary refill time <3 seconds x 10 digits Dorsalis pedis palpable b/l Posterior tibial pulses palpable b/l Digital hair x 10 digits was absent Skin temperature gradient WNL b/l  Dermatological Examination: Skin with normal turgor, texture and tone b/l Toenails 1-5 b/l discolored, thick, dystrophic with subungual debris and pain with palpation to nailbeds due to thickness of nails.  Hyperkeratotic lesions submet head 5 b/l with no flocculence, no edema, no erythema, no drainage.  Musculoskeletal: Muscle strength 5/5 to all muscle groups b/l  Neurological: Sensation with 10 gram monofilament diminished. Vibratory sensation diminished.  Assessment: 1. Painful onychomycosis toenails 1-5 b/l 2. Callus submet head 5 b/l 3. NIDDM with Peripheral neuropathy  Plan: 1. Regarding her diabetic shoes, it was discovered [name] sees a Designer, jewellery. Medicare requires Diabetic shoe certification form be completed by an MD or DO. [name] related understanding and said she will contact her PCP's office to get this taken care of. 2. Continue diabetic foot care prinicples. Literature dispensed. 3. Toenails 1-5 b/l were debrided in length and girth without iatrogenic bleeding. 4. Calluses pared without incident. Gently smoothed with burr. 5. Patient to continue soft, supportive shoe gear 6. Patient to report any pedal injuries to medical professional  7. Follow up 3 months. Patient/POA to call should there be a concern in the interim.

## 2018-10-08 ENCOUNTER — Ambulatory Visit (INDEPENDENT_AMBULATORY_CARE_PROVIDER_SITE_OTHER): Payer: Medicare HMO | Admitting: Podiatry

## 2018-10-08 ENCOUNTER — Encounter: Payer: Self-pay | Admitting: Podiatry

## 2018-10-08 DIAGNOSIS — M79675 Pain in left toe(s): Secondary | ICD-10-CM | POA: Diagnosis not present

## 2018-10-08 DIAGNOSIS — L84 Corns and callosities: Secondary | ICD-10-CM

## 2018-10-08 DIAGNOSIS — B351 Tinea unguium: Secondary | ICD-10-CM

## 2018-10-08 DIAGNOSIS — E1142 Type 2 diabetes mellitus with diabetic polyneuropathy: Secondary | ICD-10-CM

## 2018-10-08 DIAGNOSIS — M79674 Pain in right toe(s): Secondary | ICD-10-CM | POA: Diagnosis not present

## 2018-10-08 NOTE — Progress Notes (Signed)
Subjective: Samantha [name] presents with diabetes, diabetic neuropathy and cc of painful, discolored, thick toenails and painful callus/corn which interfere with activities of daily living. Pain is aggravated when wearing enclosed shoe gear. Pain is relieved with periodic professional debridement.  Patient states she still has not received a call regarding getting measured for her diabetic shoes on today.  She does see  nurse practitioner [name].  Willey Blade, MD    Current Outpatient Medications:  .  ACCU-CHEK AVIVA PLUS test strip, , Disp: , Rfl:  .  ACCU-CHEK FASTCLIX LANCETS MISC, , Disp: , Rfl:  .  aspirin 81 MG tablet, Take 81 mg by mouth daily.  , Disp: , Rfl:  .  cholecalciferol (VITAMIN D) 1000 UNITS tablet, Take 1,000 Units by mouth daily., Disp: , Rfl:  .  FLUAD 0.5 ML SUSY, ADM 0.5ML IM UTD, Disp: , Rfl: 0 .  irbesartan-hydrochlorothiazide (AVALIDE) 150-12.5 MG tablet, Take 1 tablet by mouth daily., Disp: , Rfl:  .  loratadine (CLARITIN) 10 MG tablet, Take 10 mg by mouth daily.  , Disp: , Rfl:  .  meloxicam (MOBIC) 7.5 MG tablet, Take 7.5 mg by mouth daily., Disp: , Rfl: 1 .  metFORMIN (GLUCOPHAGE) 1000 MG tablet, Take 1,000 mg by mouth 2 (two) times daily with a meal.  , Disp: , Rfl:  .  rosuvastatin (CRESTOR) 5 MG tablet, , Disp: , Rfl:  .  TRAVATAN Z 0.004 % SOLN ophthalmic solution, Place 1 drop into both eyes. , Disp: , Rfl: 0 .  triamcinolone cream (KENALOG) 0.1 %, Apply 1 application topically 2 (two) times daily as needed. For irritation, Disp: , Rfl:  .  valsartan-hydrochlorothiazide (DIOVAN-HCT) 160-25 MG tablet, Take 1 tablet by mouth daily., Disp: , Rfl: 0  Allergies  Allergen Reactions  . Lipitor [Atorvastatin]     Infection on leg caused by Lipitor, takes Crestor at home  . Lisinopril Other (See Comments)    unknown    Vascular Examination: Capillary refill time <3 seconds x 10 digits Dorsalis pedis and Posterior tibial pulses present b/l No  digital hair x 10 digits Skin temperature gradient within normal limits bilaterally  Dermatological Examination: Skin with normal turgor, texture and tone b/l  Toenails 1-5 b/l discolored, thick, dystrophic with subungual debris and pain with palpation to nailbeds due to thickness of nails.  Hyperkeratotic lesions noted submetatarsal head 5 bilaterally.  No erythema, no edema, no drainage, no flocculence noted.  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 Vibratory sensation diminished  Assessment: 1. Painful onychomycosis toenails 1-5 b/l 2. Callus submetatarsal head 5 bilaterally 3. NIDDM with Diabetic neuropathy  Plan: 1. Continue diabetic foot care principles.  Literature dispensed on today 2. Toenails 1-5 b/l were debrided in length and girth without iatrogenic bleeding. 3. Hyperkeratotic lesion pared with sterile blade submetatarsal head 5 bilaterally without incident. 4. Patient to continue soft, supportive shoe gear.  We will further research the issue with her PCP.  I explained to her that her diabetic shoe paperwork could not be completed by nurse practitioner nor physician's assistant..  Per Medicare guidelines her diabetic shoe paperwork must be signed by an MD or for a DO.  She related understanding. 5. Patient to report any pedal injuries to medical professional immediately. 6. Follow up 3 months.  7. Patient/POA to call should there be a concern in the interim.

## 2018-10-08 NOTE — Patient Instructions (Addendum)

## 2019-01-07 ENCOUNTER — Encounter: Payer: Self-pay | Admitting: Podiatry

## 2019-01-07 ENCOUNTER — Other Ambulatory Visit: Payer: Self-pay

## 2019-01-07 ENCOUNTER — Ambulatory Visit (INDEPENDENT_AMBULATORY_CARE_PROVIDER_SITE_OTHER): Payer: Medicare HMO | Admitting: Podiatry

## 2019-01-07 VITALS — Temp 98.1°F

## 2019-01-07 DIAGNOSIS — E1142 Type 2 diabetes mellitus with diabetic polyneuropathy: Secondary | ICD-10-CM

## 2019-01-07 DIAGNOSIS — M79675 Pain in left toe(s): Secondary | ICD-10-CM

## 2019-01-07 DIAGNOSIS — M79674 Pain in right toe(s): Secondary | ICD-10-CM | POA: Diagnosis not present

## 2019-01-07 DIAGNOSIS — L84 Corns and callosities: Secondary | ICD-10-CM

## 2019-01-07 DIAGNOSIS — B351 Tinea unguium: Secondary | ICD-10-CM | POA: Diagnosis not present

## 2019-01-07 NOTE — Patient Instructions (Signed)
Diabetes Mellitus and Foot Care  Foot care is an important part of your health, especially when you have diabetes. Diabetes may cause you to have problems because of poor blood flow (circulation) to your feet and legs, which can cause your skin to:   Become thinner and drier.   Break more easily.   Heal more slowly.   Peel and crack.  You may also have nerve damage (neuropathy) in your legs and feet, causing decreased feeling in them. This means that you may not notice minor injuries to your feet that could lead to more serious problems. Noticing and addressing any potential problems early is the best way to prevent future foot problems.  How to care for your feet  Foot hygiene   Wash your feet daily with warm water and mild soap. Do not use hot water. Then, pat your feet and the areas between your toes until they are completely dry. Do not soak your feet as this can dry your skin.   Trim your toenails straight across. Do not dig under them or around the cuticle. File the edges of your nails with an emery board or nail file.   Apply a moisturizing lotion or petroleum jelly to the skin on your feet and to dry, brittle toenails. Use lotion that does not contain alcohol and is unscented. Do not apply lotion between your toes.  Shoes and socks   Wear clean socks or stockings every day. Make sure they are not too tight. Do not wear knee-high stockings since they may decrease blood flow to your legs.   Wear shoes that fit properly and have enough cushioning. Always look in your shoes before you put them on to be sure there are no objects inside.   To break in new shoes, wear them for just a few hours a day. This prevents injuries on your feet.  Wounds, scrapes, corns, and calluses   Check your feet daily for blisters, cuts, bruises, sores, and redness. If you cannot see the bottom of your feet, use a mirror or ask someone for help.   Do not cut corns or calluses or try to remove them with medicine.   If you  find a minor scrape, cut, or break in the skin on your feet, keep it and the skin around it clean and dry. You may clean these areas with mild soap and water. Do not clean the area with peroxide, alcohol, or iodine.   If you have a wound, scrape, corn, or callus on your foot, look at it several times a day to make sure it is healing and not infected. Check for:  ? Redness, swelling, or pain.  ? Fluid or blood.  ? Warmth.  ? Pus or a bad smell.  General instructions   Do not cross your legs. This may decrease blood flow to your feet.   Do not use heating pads or hot water bottles on your feet. They may burn your skin. If you have lost feeling in your feet or legs, you may not know this is happening until it is too late.   Protect your feet from hot and cold by wearing shoes, such as at the beach or on hot pavement.   Schedule a complete foot exam at least once a year (annually) or more often if you have foot problems. If you have foot problems, report any cuts, sores, or bruises to your health care provider immediately.  Contact a health care provider if:     You have a medical condition that increases your risk of infection and you have any cuts, sores, or bruises on your feet.   You have an injury that is not healing.   You have redness on your legs or feet.   You feel burning or tingling in your legs or feet.   You have pain or cramps in your legs and feet.   Your legs or feet are numb.   Your feet always feel cold.   You have pain around a toenail.  Get help right away if:   You have a wound, scrape, corn, or callus on your foot and:  ? You have pain, swelling, or redness that gets worse.  ? You have fluid or blood coming from the wound, scrape, corn, or callus.  ? Your wound, scrape, corn, or callus feels warm to the touch.  ? You have pus or a bad smell coming from the wound, scrape, corn, or callus.  ? You have a fever.  ? You have a red line going up your leg.  Summary   Check your feet every day  for cuts, sores, red spots, swelling, and blisters.   Moisturize feet and legs daily.   Wear shoes that fit properly and have enough cushioning.   If you have foot problems, report any cuts, sores, or bruises to your health care provider immediately.   Schedule a complete foot exam at least once a year (annually) or more often if you have foot problems.  This information is not intended to replace advice given to you by your health care provider. Make sure you discuss any questions you have with your health care provider.  Document Released: 08/24/2000 Document Revised: 10/09/2017 Document Reviewed: 09/28/2016  [employer]vier Interactive Patient Education  2019 [employer]

## 2019-01-12 ENCOUNTER — Encounter: Payer: Self-pay | Admitting: Podiatry

## 2019-01-12 NOTE — Progress Notes (Addendum)
Subjective: Samantha [name] presents with history of diabetes, diabetic neuropathy and cc of painful, discolored, thick toenails.   She also has chronic plantar callosities which which pose a risk due to her diabetes.  Pain is aggravated when wearing enclosed shoe gear. Pain is relieved with periodic professional debridement.  Samantha Blade, MD is her PCP.  Samantha [name] is requesting diabetic shoes on today's visit.   Current Outpatient Medications:  .  ACCU-CHEK AVIVA PLUS test strip, , Disp: , Rfl:  .  ACCU-CHEK FASTCLIX LANCETS MISC, , Disp: , Rfl:  .  aspirin 81 MG tablet, Take 81 mg by mouth daily.  , Disp: , Rfl:  .  cholecalciferol (VITAMIN D) 1000 UNITS tablet, Take 1,000 Units by mouth daily., Disp: , Rfl:  .  FLUAD 0.5 ML SUSY, ADM 0.5ML IM UTD, Disp: , Rfl: 0 .  irbesartan-hydrochlorothiazide (AVALIDE) 150-12.5 MG tablet, Take 1 tablet by mouth daily., Disp: , Rfl:  .  loratadine (CLARITIN) 10 MG tablet, Take 10 mg by mouth daily.  , Disp: , Rfl:  .  meloxicam (MOBIC) 7.5 MG tablet, Take 7.5 mg by mouth daily., Disp: , Rfl: 1 .  metFORMIN (GLUCOPHAGE) 1000 MG tablet, Take 1,000 mg by mouth 2 (two) times daily with a meal.  , Disp: , Rfl:  .  rosuvastatin (CRESTOR) 5 MG tablet, , Disp: , Rfl:  .  TRAVATAN Z 0.004 % SOLN ophthalmic solution, Place 1 drop into both eyes. , Disp: , Rfl: 0 .  triamcinolone cream (KENALOG) 0.1 %, Apply 1 application topically 2 (two) times daily as needed. For irritation, Disp: , Rfl:  .  valsartan-hydrochlorothiazide (DIOVAN-HCT) 160-25 MG tablet, Take 1 tablet by mouth daily., Disp: , Rfl: 0  Allergies  Allergen Reactions  . Lipitor [Atorvastatin]     Infection on leg caused by Lipitor, takes Crestor at home  . Lisinopril Other (See Comments)    unknown    Vascular Examination: Capillary refill time less than 3 seconds x 10 digits.  Dorsalis pedis pulses palpable bilaterally.  Posterior tibial pulses palpable bilaterally.  Dgital hair  absent x10 digits.  Skin temperature gradient within normal limits bilaterally.  Dermatological Examination: Skin with normal turgor, texture and tone b/l.  Toenails 1-5 b/l discolored, thick, dystrophic with subungual debris and pain with palpation to nailbeds due to thickness of nails.  Hyperkeratotic lesion submetatarsal head 5 bilaterally.  There is no  erythema, no edema, no drainage, no flocculence noted.   Musculoskeletal: Muscle strength 5/5 to all LE muscle groups.  Hallux abductovalgus with bunion deformity bilaterally.  Neurological: Sensation diminished with 10 gram monofilament.  Vibratory sensation diminished  Assessment: 1. Painful onychomycosis toenails 1-5 b/l 2. Calluses submetatarsal head 5 bilaterally 3. NIDDM with Diabetic neuropathy  Plan: 1. Continue diabetic foot care principles. Literature dispensed on today. 2. Toenails 1-5 b/l were debrided in length and girth without iatrogenic bleeding. 3. Calluses pared submetatarsal head(s) 5 bilaterally utilizing sterile scalpel [name] without incident.  4. Patient to continue soft, supportive shoe gear.  She is inquiring about obtaining diabetic shoes on today.  We will send a diabetic shoe paperwork to Dr. Roland Earl office.  Once of diabetic shoes certification form is received in our office, we will schedule an appointment for her to be measured for diabetic shoes. 5. Patient to report any pedal injuries to medical professional. 6. Follow up 3 months.  7. Patient/POA to call should there be a concern in the interim.

## 2019-04-08 ENCOUNTER — Ambulatory Visit (INDEPENDENT_AMBULATORY_CARE_PROVIDER_SITE_OTHER): Payer: Medicare HMO | Admitting: Podiatry

## 2019-04-08 ENCOUNTER — Other Ambulatory Visit: Payer: Self-pay

## 2019-04-08 ENCOUNTER — Encounter: Payer: Self-pay | Admitting: Podiatry

## 2019-04-08 VITALS — Temp 97.7°F

## 2019-04-08 DIAGNOSIS — E1142 Type 2 diabetes mellitus with diabetic polyneuropathy: Secondary | ICD-10-CM

## 2019-04-08 DIAGNOSIS — L84 Corns and callosities: Secondary | ICD-10-CM | POA: Diagnosis not present

## 2019-04-08 DIAGNOSIS — B351 Tinea unguium: Secondary | ICD-10-CM

## 2019-04-08 DIAGNOSIS — M79675 Pain in left toe(s): Secondary | ICD-10-CM

## 2019-04-08 DIAGNOSIS — M79674 Pain in right toe(s): Secondary | ICD-10-CM

## 2019-04-08 NOTE — Patient Instructions (Signed)
Diabetes Mellitus and Foot Care Foot care is an important part of your health, especially when you have diabetes. Diabetes may cause you to have problems because of poor blood flow (circulation) to your feet and legs, which can cause your skin to:  Become thinner and drier.  Break more easily.  Heal more slowly.  Peel and crack. You may also have nerve damage (neuropathy) in your legs and feet, causing decreased feeling in them. This means that you may not notice minor injuries to your feet that could lead to more serious problems. Noticing and addressing any potential problems early is the best way to prevent future foot problems. How to care for your feet Foot hygiene  Wash your feet daily with warm water and mild soap. Do not use hot water. Then, pat your feet and the areas between your toes until they are completely dry. Do not soak your feet as this can dry your skin.  Trim your toenails straight across. Do not dig under them or around the cuticle. File the edges of your nails with an emery board or nail file.  Apply a moisturizing lotion or petroleum jelly to the skin on your feet and to dry, brittle toenails. Use lotion that does not contain alcohol and is unscented. Do not apply lotion between your toes. Shoes and socks  Wear clean socks or stockings every day. Make sure they are not too tight. Do not wear knee-high stockings since they may decrease blood flow to your legs.  Wear shoes that fit properly and have enough cushioning. Always look in your shoes before you put them on to be sure there are no objects inside.  To break in new shoes, wear them for just a few hours a day. This prevents injuries on your feet. Wounds, scrapes, corns, and calluses  Check your feet daily for blisters, cuts, bruises, sores, and redness. If you cannot see the bottom of your feet, use a mirror or ask someone for help.  Do not cut corns or calluses or try to remove them with medicine.  If you  find a minor scrape, cut, or break in the skin on your feet, keep it and the skin around it clean and dry. You may clean these areas with mild soap and water. Do not clean the area with peroxide, alcohol, or iodine.  If you have a wound, scrape, corn, or callus on your foot, look at it several times a day to make sure it is healing and not infected. Check for: ? Redness, swelling, or pain. ? Fluid or blood. ? Warmth. ? Pus or a bad smell. General instructions  Do not cross your legs. This may decrease blood flow to your feet.  Do not use heating pads or hot water bottles on your feet. They may burn your skin. If you have lost feeling in your feet or legs, you may not know this is happening until it is too late.  Protect your feet from hot and cold by wearing shoes, such as at the beach or on hot pavement.  Schedule a complete foot exam at least once a year (annually) or more often if you have foot problems. If you have foot problems, report any cuts, sores, or bruises to your health care provider immediately. Contact a health care provider if:  You have a medical condition that increases your risk of infection and you have any cuts, sores, or bruises on your feet.  You have an injury that is not   healing.  You have redness on your legs or feet.  You feel burning or tingling in your legs or feet.  You have pain or cramps in your legs and feet.  Your legs or feet are numb.  Your feet always feel cold.  You have pain around a toenail. Get help right away if:  You have a wound, scrape, corn, or callus on your foot and: ? You have pain, swelling, or redness that gets worse. ? You have fluid or blood coming from the wound, scrape, corn, or callus. ? Your wound, scrape, corn, or callus feels warm to the touch. ? You have pus or a bad smell coming from the wound, scrape, corn, or callus. ? You have a fever. ? You have a red line going up your leg. Summary  Check your feet every day  for cuts, sores, red spots, swelling, and blisters.  Moisturize feet and legs daily.  Wear shoes that fit properly and have enough cushioning.  If you have foot problems, report any cuts, sores, or bruises to your health care provider immediately.  Schedule a complete foot exam at least once a year (annually) or more often if you have foot problems. This information is not intended to replace advice given to you by your health care provider. Make sure you discuss any questions you have with your health care provider. Document Released: 08/24/2000 Document Revised: 10/09/2017 Document Reviewed: 09/28/2016 Elsevier Patient Education  2020 [employer]   Onychomycosis/Fungal Toenails  WHAT IS IT? An infection that lies within the keratin of your nail plate that is caused by a fungus.  WHY ME? Fungal infections affect all ages, sexes, races, and creeds.  There may be many factors that predispose you to a fungal infection such as age, coexisting medical conditions such as diabetes, or an autoimmune disease; stress, medications, fatigue, genetics, etc.  Bottom line: fungus thrives in a warm, moist environment and your shoes offer such a location.  IS IT CONTAGIOUS? Theoretically, yes.  You do not want to share shoes, nail clippers or files with someone who has fungal toenails.  Walking around barefoot in the same room or sleeping in the same bed is unlikely to transfer the organism.  It is important to realize, however, that fungus can spread easily from one nail to the next on the same foot.  HOW DO WE TREAT THIS?  There are several ways to treat this condition.  Treatment may depend on many factors such as age, medications, pregnancy, liver and kidney conditions, etc.  It is best to ask your doctor which options are available to you.  1. No treatment.   Unlike many other medical concerns, you can live with this condition.  However for many people this can be a painful condition and may lead to  ingrown toenails or a bacterial infection.  It is recommended that you keep the nails cut short to help reduce the amount of fungal nail. 2. Topical treatment.  These range from herbal remedies to prescription strength nail lacquers.  About 40-50% effective, topicals require twice daily application for approximately 9 to 12 months or until an entirely new nail has grown out.  The most effective topicals are medical grade medications available through physicians offices. 3. Oral antifungal medications.  With an 80-90% cure rate, the most common oral medication requires 3 to 4 months of therapy and stays in your system for a year as the new nail grows out.  Oral antifungal medications do require   blood work to make sure it is a safe drug for you.  A liver function panel will be performed prior to starting the medication and after the first month of treatment.  It is important to have the blood work performed to avoid any harmful side effects.  In general, this medication safe but blood work is required. 4. Laser Therapy.  This treatment is performed by applying a specialized laser to the affected nail plate.  This therapy is noninvasive, fast, and non-painful.  It is not covered by insurance and is therefore, out of pocket.  The results have been very good with a 80-95% cure rate.  [facility] is the only practice in the area to offer this therapy. 5. Permanent Nail Avulsion.  Removing the entire nail so that a new nail will not grow back. 

## 2019-04-08 NOTE — Progress Notes (Signed)
Subjective: Samantha [name] presents for preventative diabetic foot care with painful mycotic toenails and calluses b/l feet. Both interfere with activities of daily living. Pain is aggravated when wearing enclosed shoe gear. Pain is relieved with periodic professional debridement.  Willey Blade, MD is her PCP.   She voices no new pedal problems on today's visit.  Current Outpatient Medications:  .  ACCU-CHEK AVIVA PLUS test strip, , Disp: , Rfl:  .  ACCU-CHEK FASTCLIX LANCETS MISC, , Disp: , Rfl:  .  aspirin 81 MG tablet, Take 81 mg by mouth daily.  , Disp: , Rfl:  .  cholecalciferol (VITAMIN D) 1000 UNITS tablet, Take 1,000 Units by mouth daily., Disp: , Rfl:  .  FLUAD 0.5 ML SUSY, ADM 0.5ML IM UTD, Disp: , Rfl: 0 .  irbesartan-hydrochlorothiazide (AVALIDE) 150-12.5 MG tablet, Take 1 tablet by mouth daily., Disp: , Rfl:  .  loratadine (CLARITIN) 10 MG tablet, Take 10 mg by mouth daily.  , Disp: , Rfl:  .  meloxicam (MOBIC) 7.5 MG tablet, Take 7.5 mg by mouth daily., Disp: , Rfl: 1 .  metFORMIN (GLUCOPHAGE) 1000 MG tablet, Take 1,000 mg by mouth 2 (two) times daily with a meal.  , Disp: , Rfl:  .  rosuvastatin (CRESTOR) 5 MG tablet, , Disp: , Rfl:  .  TRAVATAN Z 0.004 % SOLN ophthalmic solution, Place 1 drop into both eyes. , Disp: , Rfl: 0 .  triamcinolone cream (KENALOG) 0.1 %, Apply 1 application topically 2 (two) times daily as needed. For irritation, Disp: , Rfl:  .  valsartan-hydrochlorothiazide (DIOVAN-HCT) 160-25 MG tablet, Take 1 tablet by mouth daily., Disp: , Rfl: 0  Allergies  Allergen Reactions  . Lipitor [Atorvastatin]     Infection on leg caused by Lipitor, takes Crestor at home  . Lisinopril Other (See Comments)    unknown    Objective: Vitals:   04/08/19 1108  Temp: 97.7 F (36.5 C)   Vascular Examination: Capillary refill time <3 seconds x 10 digits.  Dorsalis pedis pulses palpable b/l.  Posterior tibial pulses palpable b/l.  Digital hair absent x  10 digits.  Skin temperature gradient WNL b/l.  Dermatological Examination: Skin with normal turgor, texture and tone b/l.  Toenails 1-5 b/l discolored, thick, dystrophic with subungual debris and pain with palpation to nailbeds due to thickness of nails.  Hyperkeratotic lesion(s) submetatarsal head 5 b/l. No erythema, no edema, no drainage, no flocculence noted.   Musculoskeletal: Muscle strength 5/5 to all LE muscle groups.  HAV with bunion b/l.  No pain, crepitus or joint limitation with passive/active ROM.  Neurological: Sensation diminished with 10 gram monofilament.  Vibratory sensation diminished.  Assessment: 1. Painful onychomycosis toenails 1-5 b/l 2. Callus submet head 5 b/l 3. NIDDM with Diabetic neuropathy  Plan: 1. Continue diabetic foot care principles. Literature dispensed on today. 2. Toenails 1-5 b/l were debrided in length and girth without iatrogenic bleeding. 3. Calluses pared submetatarsal head(s) 5 b/l utilizing sterile scalpel blade without incident. Corn(s) pared utilizing sterile scalpel blade without incident.  4. Patient to continue soft, supportive shoe gear 5. Patient to report any pedal injuries to medical professional  6. Follow up 3 months.  7. Patient/POA to call should there be a concern in the interim.

## 2019-07-07 ENCOUNTER — Other Ambulatory Visit: Payer: Self-pay

## 2019-07-07 ENCOUNTER — Ambulatory Visit (INDEPENDENT_AMBULATORY_CARE_PROVIDER_SITE_OTHER): Payer: Medicare HMO | Admitting: Podiatry

## 2019-07-07 ENCOUNTER — Encounter: Payer: Self-pay | Admitting: Podiatry

## 2019-07-07 DIAGNOSIS — M79674 Pain in right toe(s): Secondary | ICD-10-CM | POA: Diagnosis not present

## 2019-07-07 DIAGNOSIS — B351 Tinea unguium: Secondary | ICD-10-CM | POA: Diagnosis not present

## 2019-07-07 DIAGNOSIS — M79675 Pain in left toe(s): Secondary | ICD-10-CM | POA: Diagnosis not present

## 2019-07-07 DIAGNOSIS — E1142 Type 2 diabetes mellitus with diabetic polyneuropathy: Secondary | ICD-10-CM | POA: Diagnosis not present

## 2019-07-07 DIAGNOSIS — L84 Corns and callosities: Secondary | ICD-10-CM | POA: Diagnosis not present

## 2019-07-07 NOTE — Patient Instructions (Signed)
Diabetes Mellitus and Foot Care Foot care is an important part of your health, especially when you have diabetes. Diabetes may cause you to have problems because of poor blood flow (circulation) to your feet and legs, which can cause your skin to:  Become thinner and drier.  Break more easily.  Heal more slowly.  Peel and crack. You may also have nerve damage (neuropathy) in your legs and feet, causing decreased feeling in them. This means that you may not notice minor injuries to your feet that could lead to more serious problems. Noticing and addressing any potential problems early is the best way to prevent future foot problems. How to care for your feet Foot hygiene  Wash your feet daily with warm water and mild soap. Do not use hot water. Then, pat your feet and the areas between your toes until they are completely dry. Do not soak your feet as this can dry your skin.  Trim your toenails straight across. Do not dig under them or around the cuticle. File the edges of your nails with an emery board or nail file.  Apply a moisturizing lotion or petroleum jelly to the skin on your feet and to dry, brittle toenails. Use lotion that does not contain alcohol and is unscented. Do not apply lotion between your toes. Shoes and socks  Wear clean socks or stockings every day. Make sure they are not too tight. Do not wear knee-high stockings since they may decrease blood flow to your legs.  Wear shoes that fit properly and have enough cushioning. Always look in your shoes before you put them on to be sure there are no objects inside.  To break in new shoes, wear them for just a few hours a day. This prevents injuries on your feet. Wounds, scrapes, corns, and calluses  Check your feet daily for blisters, cuts, bruises, sores, and redness. If you cannot see the bottom of your feet, use a mirror or ask someone for help.  Do not cut corns or calluses or try to remove them with medicine.  If you  find a minor scrape, cut, or break in the skin on your feet, keep it and the skin around it clean and dry. You may clean these areas with mild soap and water. Do not clean the area with peroxide, alcohol, or iodine.  If you have a wound, scrape, corn, or callus on your foot, look at it several times a day to make sure it is healing and not infected. Check for: ? Redness, swelling, or pain. ? Fluid or blood. ? Warmth. ? Pus or a bad smell. General instructions  Do not cross your legs. This may decrease blood flow to your feet.  Do not use heating pads or hot water bottles on your feet. They may burn your skin. If you have lost feeling in your feet or legs, you may not know this is happening until it is too late.  Protect your feet from hot and cold by wearing shoes, such as at the beach or on hot pavement.  Schedule a complete foot exam at least once a year (annually) or more often if you have foot problems. If you have foot problems, report any cuts, sores, or bruises to your health care provider immediately. Contact a health care provider if:  You have a medical condition that increases your risk of infection and you have any cuts, sores, or bruises on your feet.  You have an injury that is not   healing.  You have redness on your legs or feet.  You feel burning or tingling in your legs or feet.  You have pain or cramps in your legs and feet.  Your legs or feet are numb.  Your feet always feel cold.  You have pain around a toenail. Get help right away if:  You have a wound, scrape, corn, or callus on your foot and: ? You have pain, swelling, or redness that gets worse. ? You have fluid or blood coming from the wound, scrape, corn, or callus. ? Your wound, scrape, corn, or callus feels warm to the touch. ? You have pus or a bad smell coming from the wound, scrape, corn, or callus. ? You have a fever. ? You have a red line going up your leg. Summary  Check your feet every day  for cuts, sores, red spots, swelling, and blisters.  Moisturize feet and legs daily.  Wear shoes that fit properly and have enough cushioning.  If you have foot problems, report any cuts, sores, or bruises to your health care provider immediately.  Schedule a complete foot exam at least once a year (annually) or more often if you have foot problems. This information is not intended to replace advice given to you by your health care provider. Make sure you discuss any questions you have with your health care provider. Document Released: 08/24/2000 Document Revised: 10/09/2017 Document Reviewed: 09/28/2016 Elsevier Patient Education  2020 [employer] Inc[employer]  

## 2019-07-09 NOTE — Progress Notes (Signed)
Subjective: Samantha [name] presents to clinic for preventative diabetic foot care with calluses and painful mycotic toenails b/l feet which are aggravated when weightbearing with and without shoe gear.  This pain limits her daily activities. Pain symptoms resolve with periodic professional debridement.   Current Outpatient Medications on File Prior to Visit  Medication Sig Dispense Refill  . ACCU-CHEK AVIVA PLUS test strip     . ACCU-CHEK FASTCLIX LANCETS MISC     . aspirin 81 MG tablet Take 81 mg by mouth daily.      . cholecalciferol (VITAMIN D) 1000 UNITS tablet Take 1,000 Units by mouth daily.    Marland Kitchen FLUAD 0.5 ML SUSY ADM 0.5ML IM UTD  0  . irbesartan (AVAPRO) 150 MG tablet Take by mouth daily.    . irbesartan-hydrochlorothiazide (AVALIDE) 150-12.5 MG tablet Take 1 tablet by mouth daily.    Marland Kitchen loratadine (CLARITIN) 10 MG tablet Take 10 mg by mouth daily.      . meloxicam (MOBIC) 7.5 MG tablet Take 7.5 mg by mouth daily.  1  . metFORMIN (GLUCOPHAGE) 1000 MG tablet Take 1,000 mg by mouth 2 (two) times daily with a meal.      . rosuvastatin (CRESTOR) 5 MG tablet     . TRAVATAN Z 0.004 % SOLN ophthalmic solution Place 1 drop into both eyes.   0  . triamcinolone cream (KENALOG) 0.1 % Apply 1 application topically 2 (two) times daily as needed. For irritation    . valsartan-hydrochlorothiazide (DIOVAN-HCT) 160-25 MG tablet Take 1 tablet by mouth daily.  0   No current facility-administered medications on file prior to visit.      Allergies  Allergen Reactions  . Lipitor [Atorvastatin]     Infection on leg caused by Lipitor, takes Crestor at home  . Lisinopril Other (See Comments)    unknown     Objective: There were no vitals filed for this visit.  Physical Examination:  Vascular  Examination: Capillary refill time <3 seconds b/l.  Palpable DP/PT pulses b/l.  Digital hair absent b/l.  No edema noted b/l.  Skin temperature gradient WNL b/l.  Dermatological  Examination: Skin with normal turgor, texture and tone b/l.  No open wounds b/l.  No interdigital macerations noted b/l.  Elongated, thick, discolored brittle toenails with subungual debris and pain on dorsal palpation of nailbeds 1-5 b/l.  Hyperkeratotic lesion submet head 5 b/l and left hallux with tenderness to palpation. No edema, no erythema, no drainage, no flocculence.   Musculoskeletal Examination: Muscle strength 5/5 to all muscle groups b/l.  HAV with bunion b/l.  No pain, crepitus or joint discomfort with active/passive ROM.  Neurological Examination: Sensation diminished b/l  with 10 gram monofilament.  Vibratory sensation diminished b/l.  Assessment: 1. Mycotic nail infection with pain 1-5 b/l 2. Calluses submet head 5 b/l and left hallux 3. NIDDM with neuropathy  Plan: 1. Toenails 1-5 b/l were debrided in length and girth without iatrogenic laceration. Calluses pared submetatarsal head 5 b/l and left hallux utilizing sterile scalpel blade without incident. Continue soft, supportive shoe gear daily. Report any pedal injuries to medical professional. Follow up 3 months. Patient/POA to call should there be a question/concern in there interim.

## 2019-10-14 ENCOUNTER — Ambulatory Visit (INDEPENDENT_AMBULATORY_CARE_PROVIDER_SITE_OTHER): Payer: Medicare HMO | Admitting: Podiatry

## 2019-10-14 ENCOUNTER — Other Ambulatory Visit: Payer: Self-pay

## 2019-10-14 DIAGNOSIS — M79675 Pain in left toe(s): Secondary | ICD-10-CM | POA: Diagnosis not present

## 2019-10-14 DIAGNOSIS — B351 Tinea unguium: Secondary | ICD-10-CM

## 2019-10-14 DIAGNOSIS — L84 Corns and callosities: Secondary | ICD-10-CM | POA: Diagnosis not present

## 2019-10-14 DIAGNOSIS — E1142 Type 2 diabetes mellitus with diabetic polyneuropathy: Secondary | ICD-10-CM | POA: Diagnosis not present

## 2019-10-14 DIAGNOSIS — M79674 Pain in right toe(s): Secondary | ICD-10-CM | POA: Diagnosis not present

## 2019-10-14 NOTE — Patient Instructions (Signed)
Diabetes Mellitus and Foot Care Foot care is an important part of your health, especially when you have diabetes. Diabetes may cause you to have problems because of poor blood flow (circulation) to your feet and legs, which can cause your skin to:  Become thinner and drier.  Break more easily.  Heal more slowly.  Peel and crack. You may also have nerve damage (neuropathy) in your legs and feet, causing decreased feeling in them. This means that you may not notice minor injuries to your feet that could lead to more serious problems. Noticing and addressing any potential problems early is the best way to prevent future foot problems. How to care for your feet Foot hygiene  Wash your feet daily with warm water and mild soap. Do not use hot water. Then, pat your feet and the areas between your toes until they are completely dry. Do not soak your feet as this can dry your skin.  Trim your toenails straight across. Do not dig under them or around the cuticle. File the edges of your nails with an emery board or nail file.  Apply a moisturizing lotion or petroleum jelly to the skin on your feet and to dry, brittle toenails. Use lotion that does not contain alcohol and is unscented. Do not apply lotion between your toes. Shoes and socks  Wear clean socks or stockings every day. Make sure they are not too tight. Do not wear knee-high stockings since they may decrease blood flow to your legs.  Wear shoes that fit properly and have enough cushioning. Always look in your shoes before you put them on to be sure there are no objects inside.  To break in new shoes, wear them for just a few hours a day. This prevents injuries on your feet. Wounds, scrapes, corns, and calluses  Check your feet daily for blisters, cuts, bruises, sores, and redness. If you cannot see the bottom of your feet, use a mirror or ask someone for help.  Do not cut corns or calluses or try to remove them with medicine.  If you  find a minor scrape, cut, or break in the skin on your feet, keep it and the skin around it clean and dry. You may clean these areas with mild soap and water. Do not clean the area with peroxide, alcohol, or iodine.  If you have a wound, scrape, corn, or callus on your foot, look at it several times a day to make sure it is healing and not infected. Check for: ? Redness, swelling, or pain. ? Fluid or blood. ? Warmth. ? Pus or a bad smell. General instructions  Do not cross your legs. This may decrease blood flow to your feet.  Do not use heating pads or hot water bottles on your feet. They may burn your skin. If you have lost feeling in your feet or legs, you may not know this is happening until it is too late.  Protect your feet from hot and cold by wearing shoes, such as at the beach or on hot pavement.  Schedule a complete foot exam at least once a year (annually) or more often if you have foot problems. If you have foot problems, report any cuts, sores, or bruises to your health care provider immediately. Contact a health care provider if:  You have a medical condition that increases your risk of infection and you have any cuts, sores, or bruises on your feet.  You have an injury that is not   healing.  You have redness on your legs or feet.  You feel burning or tingling in your legs or feet.  You have pain or cramps in your legs and feet.  Your legs or feet are numb.  Your feet always feel cold.  You have pain around a toenail. Get help right away if:  You have a wound, scrape, corn, or callus on your foot and: ? You have pain, swelling, or redness that gets worse. ? You have fluid or blood coming from the wound, scrape, corn, or callus. ? Your wound, scrape, corn, or callus feels warm to the touch. ? You have pus or a bad smell coming from the wound, scrape, corn, or callus. ? You have a fever. ? You have a red line going up your leg. Summary  Check your feet every day  for cuts, sores, red spots, swelling, and blisters.  Moisturize feet and legs daily.  Wear shoes that fit properly and have enough cushioning.  If you have foot problems, report any cuts, sores, or bruises to your health care provider immediately.  Schedule a complete foot exam at least once a year (annually) or more often if you have foot problems. This information is not intended to replace advice given to you by your health care provider. Make sure you discuss any questions you have with your health care provider. Document Revised: 05/20/2019 Document Reviewed: 09/28/2016 Elsevier Patient Education  2020 [employer]  

## 2019-10-18 ENCOUNTER — Encounter: Payer: Self-pay | Admitting: Podiatry

## 2019-10-18 NOTE — Progress Notes (Addendum)
Subjective: Samantha [name] presents today for follow up of preventative diabetic foot care and callus(es) b/l feet and painful mycotic toenails b/l that are difficult to trim. Pain interferes with ambulation. Aggravating factors include wearing enclosed shoe gear. Pain is relieved with periodic professional debridement.   She voices no new pedal concerns on today's visit.   Allergies  Allergen Reactions  . Lipitor [Atorvastatin]     Infection on leg caused by Lipitor, takes Crestor at home  . Lisinopril Other (See Comments)    unknown     Objective: There were no vitals filed for this visit.  Vascular Examination:  Capillary fill time to digits <3s b/l, palpable DP pulses b/l, palpable PT pulses b/l, pedal hair absent b/l and skin temperature gradient within normal limits b/l  Dermatological Examination: Pedal skin with normal turgor, texture and tone bilaterally, no open wounds bilaterally, no interdigital macerations bilaterally, toenails 1-5 b/l elongated, dystrophic, thickened, crumbly with subungual debris and hyperkeratotic lesion(s) submet head 5 b/l and left hallux.  No erythema, no edema, no drainage, no flocculence  Musculoskeletal: Normal muscle strength 5/5 to all lower extremity muscle groups bilaterally, no pain crepitus or joint limitation noted with ROM b/l and bunion deformity noted b/l  Neurological: Protective sensation absent with 10g monofilament b/l and vibratory sensation absent b/l  Assessment: Pain due to onychomycosis of toenails of both feet  Calluses submet head 5 b/l and left hallux  Diabetic peripheral neuropathy associated with type 2 diabetes mellitus ([place])  Plan: -Continue diabetic foot care principles. Literature dispensed on today.  -Toenails 1-5 b/l were debrided in length and girth without iatrogenic bleeding. -calluses were debrided without complication or incident. Total number debrided =3 submet head 5 b/l and left hallux -Patient to  continue soft, supportive shoe gear daily. -Patient to report any pedal injuries to medical professional immediately. -Patient/POA to call should there be question/concern in the interim.  Return in about 3 months (around 01/11/2020) for diabetic nail and callus trim.

## 2020-01-12 ENCOUNTER — Ambulatory Visit (INDEPENDENT_AMBULATORY_CARE_PROVIDER_SITE_OTHER): Payer: Medicaid Other | Admitting: Podiatry

## 2020-01-12 ENCOUNTER — Encounter: Payer: Self-pay | Admitting: Podiatry

## 2020-01-12 ENCOUNTER — Other Ambulatory Visit: Payer: Self-pay

## 2020-01-12 VITALS — Temp 97.5°F

## 2020-01-12 DIAGNOSIS — B351 Tinea unguium: Secondary | ICD-10-CM

## 2020-01-12 DIAGNOSIS — M79674 Pain in right toe(s): Secondary | ICD-10-CM

## 2020-01-12 DIAGNOSIS — L84 Corns and callosities: Secondary | ICD-10-CM

## 2020-01-12 DIAGNOSIS — M79675 Pain in left toe(s): Secondary | ICD-10-CM

## 2020-01-12 DIAGNOSIS — E1142 Type 2 diabetes mellitus with diabetic polyneuropathy: Secondary | ICD-10-CM

## 2020-01-12 NOTE — Patient Instructions (Signed)
Diabetes Mellitus and Foot Care Foot care is an important part of your health, especially when you have diabetes. Diabetes may cause you to have problems because of poor blood flow (circulation) to your feet and legs, which can cause your skin to:  Become thinner and drier.  Break more easily.  Heal more slowly.  Peel and crack. You may also have nerve damage (neuropathy) in your legs and feet, causing decreased feeling in them. This means that you may not notice minor injuries to your feet that could lead to more serious problems. Noticing and addressing any potential problems early is the best way to prevent future foot problems. How to care for your feet Foot hygiene  Wash your feet daily with warm water and mild soap. Do not use hot water. Then, pat your feet and the areas between your toes until they are completely dry. Do not soak your feet as this can dry your skin.  Trim your toenails straight across. Do not dig under them or around the cuticle. File the edges of your nails with an emery board or nail file.  Apply a moisturizing lotion or petroleum jelly to the skin on your feet and to dry, brittle toenails. Use lotion that does not contain alcohol and is unscented. Do not apply lotion between your toes. Shoes and socks  Wear clean socks or stockings every day. Make sure they are not too tight. Do not wear knee-high stockings since they may decrease blood flow to your legs.  Wear shoes that fit properly and have enough cushioning. Always look in your shoes before you put them on to be sure there are no objects inside.  To break in new shoes, wear them for just a few hours a day. This prevents injuries on your feet. Wounds, scrapes, corns, and calluses  Check your feet daily for blisters, cuts, bruises, sores, and redness. If you cannot see the bottom of your feet, use a mirror or ask someone for help.  Do not cut corns or calluses or try to remove them with medicine.  If you  find a minor scrape, cut, or break in the skin on your feet, keep it and the skin around it clean and dry. You may clean these areas with mild soap and water. Do not clean the area with peroxide, alcohol, or iodine.  If you have a wound, scrape, corn, or callus on your foot, look at it several times a day to make sure it is healing and not infected. Check for: ? Redness, swelling, or pain. ? Fluid or blood. ? Warmth. ? Pus or a bad smell. General instructions  Do not cross your legs. This may decrease blood flow to your feet.  Do not use heating pads or hot water bottles on your feet. They may burn your skin. If you have lost feeling in your feet or legs, you may not know this is happening until it is too late.  Protect your feet from hot and cold by wearing shoes, such as at the beach or on hot pavement.  Schedule a complete foot exam at least once a year (annually) or more often if you have foot problems. If you have foot problems, report any cuts, sores, or bruises to your health care provider immediately. Contact a health care provider if:  You have a medical condition that increases your risk of infection and you have any cuts, sores, or bruises on your feet.  You have an injury that is not   healing.  You have redness on your legs or feet.  You feel burning or tingling in your legs or feet.  You have pain or cramps in your legs and feet.  Your legs or feet are numb.  Your feet always feel cold.  You have pain around a toenail. Get help right away if:  You have a wound, scrape, corn, or callus on your foot and: ? You have pain, swelling, or redness that gets worse. ? You have fluid or blood coming from the wound, scrape, corn, or callus. ? Your wound, scrape, corn, or callus feels warm to the touch. ? You have pus or a bad smell coming from the wound, scrape, corn, or callus. ? You have a fever. ? You have a red line going up your leg. Summary  Check your feet every day  for cuts, sores, red spots, swelling, and blisters.  Moisturize feet and legs daily.  Wear shoes that fit properly and have enough cushioning.  If you have foot problems, report any cuts, sores, or bruises to your health care provider immediately.  Schedule a complete foot exam at least once a year (annually) or more often if you have foot problems. This information is not intended to replace advice given to you by your health care provider. Make sure you discuss any questions you have with your health care provider. Document Revised: 05/20/2019 Document Reviewed: 09/28/2016 Elsevier Patient Education  2020 [employer]  Peripheral Neuropathy Peripheral neuropathy is a type of nerve damage. It affects nerves that carry signals between the spinal cord and the arms, legs, and the rest of the body (peripheral nerves). It does not affect nerves in the spinal cord or brain. In peripheral neuropathy, one nerve or a group of nerves may be damaged. Peripheral neuropathy is a broad category that includes many specific nerve disorders, like diabetic neuropathy, hereditary neuropathy, and carpal tunnel syndrome. What are the causes? This condition may be caused by:  Diabetes. This is the most common cause of peripheral neuropathy.  Nerve injury.  Pressure or stress on a nerve that lasts a long time.  Lack (deficiency) of B vitamins. This can result from alcoholism, poor diet, or a restricted diet.  Infections.  Autoimmune diseases, such as rheumatoid arthritis and systemic lupus erythematosus.  Nerve diseases that are passed from parent to child (inherited).  Some medicines, such as cancer medicines (chemotherapy).  Poisonous (toxic) substances, such as lead and mercury.  Too little blood flowing to the legs.  Kidney disease.  Thyroid disease. In some cases, the cause of this condition is not known. What are the signs or symptoms? Symptoms of this condition depend on which of your  nerves is damaged. Common symptoms include:  Loss of feeling (numbness) in the feet, hands, or both.  Tingling in the feet, hands, or both.  Burning pain.  Very sensitive skin.  Weakness.  Not being able to move a part of the body (paralysis).  Muscle twitching.  Clumsiness or poor coordination.  Loss of balance.  Not being able to control your bladder.  Feeling dizzy.  Sexual problems. How is this diagnosed? Diagnosing and finding the cause of peripheral neuropathy can be difficult. Your health care provider will take your medical history and do a physical exam. A neurological exam will also be done. This involves checking things that are affected by your brain, spinal cord, and nerves (nervous system). For example, your health care provider will check your reflexes, how you move, and   what you can feel. You may have other tests, such as:  Blood tests.  Electromyogram (EMG) and nerve conduction tests. These tests check nerve function and how well the nerves are controlling the muscles.  Imaging tests, such as CT scans or MRI to rule out other causes of your symptoms.  Removing a small piece of nerve to be examined in a lab (nerve biopsy). This is rare.  Removing and examining a small amount of the fluid that surrounds the brain and spinal cord (lumbar puncture). This is rare. How is this treated? Treatment for this condition may involve:  Treating the underlying cause of the neuropathy, such as diabetes, kidney disease, or vitamin deficiencies.  Stopping medicines that can cause neuropathy, such as chemotherapy.  Medicine to relieve pain. Medicines may include: ? Prescription or over-the-counter pain medicine. ? Antiseizure medicine. ? Antidepressants. ? Pain-relieving patches that are applied to painful areas of skin.  Surgery to relieve pressure on a nerve or to destroy a nerve that is causing pain.  Physical therapy to help improve movement and  balance.  Devices to help you move around (assistive devices). Follow these instructions at home: Medicines  Take over-the-counter and prescription medicines only as told by your health care provider. Do not take any other medicines without first asking your health care provider.  Do not drive or use heavy machinery while taking prescription pain medicine. Lifestyle   Do not use any products that contain nicotine or tobacco, such as cigarettes and e-cigarettes. Smoking keeps blood from reaching damaged nerves. If you need help quitting, ask your health care provider.  Avoid or limit alcohol. Too much alcohol can cause a vitamin B deficiency, and vitamin B is needed for healthy nerves.  Eat a healthy diet. This includes: ? Eating foods that are high in fiber, such as fresh fruits and vegetables, whole grains, and beans. ? Limiting foods that are high in fat and processed sugars, such as fried or sweet foods. General instructions   If you have diabetes, work closely with your health care provider to keep your blood sugar under control.  If you have numbness in your feet: ? Check every day for signs of injury or infection. Watch for redness, warmth, and swelling. ? Wear padded socks and comfortable shoes. These help protect your feet.  Develop a good support system. Living with peripheral neuropathy can be stressful. Consider talking with a mental health specialist or joining a support group.  Use assistive devices and attend physical therapy as told by your health care provider. This may include using a walker or a cane.  Keep all follow-up visits as told by your health care provider. This is important. Contact a health care provider if:  You have new signs or symptoms of peripheral neuropathy.  You are struggling emotionally from dealing with peripheral neuropathy.  Your pain is not well-controlled. Get help right away if:  You have an injury or infection that is not healing  normally.  You develop new weakness in an arm or leg.  You fall frequently. Summary  Peripheral neuropathy is when the nerves in the arms, or legs are damaged, resulting in numbness, weakness, or pain.  There are many causes of peripheral neuropathy, including diabetes, pinched nerves, vitamin deficiencies, autoimmune disease, and hereditary conditions.  Diagnosing and finding the cause of peripheral neuropathy can be difficult. Your health care provider will take your medical history, do a physical exam, and do tests, including blood tests and nerve function tests.    Treatment involves treating the underlying cause of the neuropathy and taking medicines to help control pain. Physical therapy and assistive devices may also help. This information is not intended to replace advice given to you by your health care provider. Make sure you discuss any questions you have with your health care provider. Document Revised: 08/09/2017 Document Reviewed: 11/05/2016 Elsevier Patient Education  2020 [employer]  Corns and Calluses Corns are small areas of thickened skin that occur on the top, sides, or tip of a toe. They contain a cone-shaped core with a point that can press on a nerve below. This causes pain.  Calluses are areas of thickened skin that can occur anywhere on the body, including the hands, fingers, palms, soles of the feet, and heels. Calluses are usually larger than corns. What are the causes? Corns and calluses are caused by rubbing (friction) or pressure, such as from shoes that are too tight or do not fit properly. What increases the risk? Corns are more likely to develop in people who have misshapen toes (toe deformities), such as hammer toes. Calluses can occur with friction to any area of the skin. They are more likely to develop in people who:  Work with their hands.  Wear shoes that fit poorly, are too tight, or are high-heeled.  Have toe deformities. What are the signs  or symptoms? Symptoms of a corn or callus include:  A hard growth on the skin.  Pain or tenderness under the skin.  Redness and swelling.  Increased discomfort while wearing tight-fitting shoes, if your feet are affected. If a corn or callus becomes infected, symptoms may include:  Redness and swelling that gets worse.  Pain.  Fluid, blood, or pus draining from the corn or callus. How is this diagnosed? Corns and calluses may be diagnosed based on your symptoms, your medical history, and a physical exam. How is this treated? Treatment for corns and calluses may include:  Removing the cause of the friction or pressure. This may involve: ? Changing your shoes. ? Wearing shoe inserts (orthotics) or other protective layers in your shoes, such as a corn pad. ? Wearing gloves.  Applying medicine to the skin (topical medicine) to help soften skin in the hardened, thickened areas.  Removing layers of dead skin with a file to reduce the size of the corn or callus.  Removing the corn or callus with a scalpel or laser.  Taking antibiotic medicines, if your corn or callus is infected.  Having surgery, if a toe deformity is the cause. Follow these instructions at home:   Take over-the-counter and prescription medicines only as told by your health care provider.  If you were prescribed an antibiotic, take it as told by your health care provider. Do not stop taking it even if your condition starts to improve.  Wear shoes that fit well. Avoid wearing high-heeled shoes and shoes that are too tight or too loose.  Wear any padding, protective layers, gloves, or orthotics as told by your health care provider.  Soak your hands or feet and then use a file or pumice stone to soften your corn or callus. Do this as told by your health care provider.  Check your corn or callus every day for symptoms of infection. Contact a health care provider if you:  Notice that your symptoms do not  improve with treatment.  Have redness or swelling that gets worse.  Notice that your corn or callus becomes painful.  Have fluid, blood, or   pus coming from your corn or callus.  Have new symptoms. Summary  Corns are small areas of thickened skin that occur on the top, sides, or tip of a toe.  Calluses are areas of thickened skin that can occur anywhere on the body, including the hands, fingers, palms, and soles of the feet. Calluses are usually larger than corns.  Corns and calluses are caused by rubbing (friction) or pressure, such as from shoes that are too tight or do not fit properly.  Treatment may include wearing any padding, protective layers, gloves, or orthotics as told by your health care provider. This information is not intended to replace advice given to you by your health care provider. Make sure you discuss any questions you have with your health care provider. Document Revised: 12/17/2018 Document Reviewed: 07/10/2017 Elsevier Patient Education  2020 [employer] Inc.  

## 2020-01-17 NOTE — Progress Notes (Signed)
Subjective: Samantha [name] presents today painful mycotic nails b/l that are difficult to trim. Pain interferes with ambulation. Aggravating factors include wearing enclosed shoe gear. Pain is relieved with periodic professional debridement. Patient is also diabetic.   She voices no new pedal problems on today's visit.  Willey Blade, MD is patient's PCP.   Medications reviewed in chart. Current Outpatient Medications on File Prior to Visit  Medication Sig  . ACCU-CHEK AVIVA PLUS test strip   . ACCU-CHEK FASTCLIX LANCETS MISC   . aspirin 81 MG tablet Take 81 mg by mouth daily.    . cholecalciferol (VITAMIN D) 1000 UNITS tablet Take 1,000 Units by mouth daily.  Marland Kitchen FLUAD 0.5 ML SUSY ADM 0.5ML IM UTD  . irbesartan (AVAPRO) 150 MG tablet Take by mouth daily.  . irbesartan-hydrochlorothiazide (AVALIDE) 150-12.5 MG tablet Take 1 tablet by mouth daily.  Marland Kitchen loratadine (CLARITIN) 10 MG tablet Take 10 mg by mouth daily.    . meloxicam (MOBIC) 7.5 MG tablet Take 7.5 mg by mouth daily.  . metFORMIN (GLUCOPHAGE) 1000 MG tablet Take 1,000 mg by mouth 2 (two) times daily with a meal.    . rosuvastatin (CRESTOR) 5 MG tablet   . TRAVATAN Z 0.004 % SOLN ophthalmic solution Place 1 drop into both eyes.   Marland Kitchen triamcinolone cream (KENALOG) 0.1 % Apply 1 application topically 2 (two) times daily as needed. For irritation  . valsartan-hydrochlorothiazide (DIOVAN-HCT) 160-25 MG tablet Take 1 tablet by mouth daily.   No current facility-administered medications on file prior to visit.   Allergies  Allergen Reactions  . Lipitor [Atorvastatin]     Infection on leg caused by Lipitor, takes Crestor at home  . Lisinopril Other (See Comments)    unknown   Objective: General: Samantha [name] is a 74 y.o. AAF in NAD. AAO x 3.   Vitals:   01/12/20 1003  Temp: (!) 97.5 F (36.4 C)   Neurovascular Examination: Neurovascular status unchanged b/l.  Dermatological Examination: Pedal skin with normal turgor,  texture and tone bilaterally. No open wounds bilaterally. No interdigital macerations bilaterally. Toenails 1-5 b/l elongated, dystrophic, thickened, crumbly with subungual debris and tenderness to dorsal palpation. Hyperkeratotic lesion(s) L hallux, submet head 5 left foot and submet head 5 right foot.  No erythema, no edema, no drainage, no flocculence.  Musculoskeletal: Normal muscle strength 5/5 to all lower extremity muscle groups bilaterally. No pain crepitus or joint limitation noted with ROM b/l. 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 Hammertoes noted to the L 5th toe and R 5th toe.  Assessment: 1. Pain due to onychomycosis of toenails of both feet   2. Callus   3. Diabetic peripheral neuropathy associated with type 2 diabetes mellitus ([place])    Plan: -No new findings. No new orders. -Continue diabetic foot care principles. Literature dispensed on today.  -Toenails 1-5 b/l were debrided in length and girth with sterile nail nippers and dremel without iatrogenic bleeding.  -Callus(es) L hallux, submet head 5 left foot and submet head 5 right foot pared utilizing sterile scalpel blade without complication or incident. Total number debrided =3. -Patient to continue soft, supportive shoe gear daily. -Patient to report any pedal injuries to medical professional immediately. -Patient/POA to call should there be question/concern in the interim.  Return in about 3 months (around 04/13/2020) for diabetic nail and callus trim.

## 2020-04-13 ENCOUNTER — Ambulatory Visit (INDEPENDENT_AMBULATORY_CARE_PROVIDER_SITE_OTHER): Payer: Medicare HMO | Admitting: Podiatry

## 2020-04-13 ENCOUNTER — Encounter: Payer: Self-pay | Admitting: Podiatry

## 2020-04-13 ENCOUNTER — Other Ambulatory Visit: Payer: Self-pay

## 2020-04-13 DIAGNOSIS — B351 Tinea unguium: Secondary | ICD-10-CM | POA: Diagnosis not present

## 2020-04-13 DIAGNOSIS — M79675 Pain in left toe(s): Secondary | ICD-10-CM | POA: Diagnosis not present

## 2020-04-13 DIAGNOSIS — M79674 Pain in right toe(s): Secondary | ICD-10-CM | POA: Diagnosis not present

## 2020-04-13 DIAGNOSIS — E1142 Type 2 diabetes mellitus with diabetic polyneuropathy: Secondary | ICD-10-CM | POA: Diagnosis not present

## 2020-04-14 NOTE — Progress Notes (Signed)
Subjective:  Patient ID: Samantha [name], female    DOB: 08/09/1946,  MRN: [id number]  74 y.o. female presents with at risk foot care with history of diabetic neuropathy and painful thick toenails that are difficult to trim. Pain interferes with ambulation. Aggravating factors include wearing enclosed shoe gear. Pain is relieved with periodic professional debridement.    Review of Systems: Negative except as noted in the HPI.  Past Medical History:  Diagnosis Date  . Arthritis    neck   . Constipation    takes an OTC stool softener and uses an enema every3wks  . DM    takes Glipizide and Metformin daily  . DM W/NEURO MNFST, TYPE II, UNCONTROLLED   . Dysrhythmia    " heart Palpatations "  . HEMATURIA, MICROSCOPIC   . High cholesterol    takes Crestor daily  . HYPERTENSION, BENIGN ESSENTIAL    not on any meds for this  . 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 Palpitations   . Thymoma 01/23/2012   Past Surgical History:  Procedure Laterality Date  . Kelman phacoemulsification cataract  2011   bilateral  . MEDIASTERNOTOMY  01/23/2012   Procedure: PARTIAL MEDIASTERNOTOMY;  Surgeon: Rexene Alberts, MD;  Location: [facility];  Service: Thoracic;  Laterality: N/A;  . WEDGE RESECTION  01/23/2012   Procedure: WEDGE RESECTION;  Surgeon: Rexene Alberts, MD;  Location: [facility];  Service: Thoracic;;  Wedge Resection Left Upper Lobe   Patient Active Problem List   Diagnosis Date Noted  . 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 Murmur 11/23/2011  . Preop cardiovascular exam 11/23/2011  . Hyponatremia 10/19/2011  . Dyspnea 06/28/2011  . Palpitations 06/28/2011  . Dyspnea 06/28/2011  . DM 02/17/2007  . DM W/NEURO MNFST, TYPE II, UNCONTROLLED 02/17/2007  . TOBACCO USER 02/17/2007  . HYPERTENSION, BENIGN ESSENTIAL 02/17/2007  . HEMATURIA, MICROSCOPIC 02/17/2007    Current Outpatient Medications:  .  ACCU-CHEK AVIVA PLUS test strip, , Disp: ,  Rfl:  .  ACCU-CHEK FASTCLIX LANCETS MISC, , Disp: , Rfl:  .  aspirin 81 MG tablet, Take 81 mg by mouth daily.  , Disp: , Rfl:  .  cholecalciferol (VITAMIN D) 1000 UNITS tablet, Take 1,000 Units by mouth daily., Disp: , Rfl:  .  FLUAD 0.5 ML SUSY, ADM 0.5ML IM UTD, Disp: , Rfl: 0 .  irbesartan (AVAPRO) 150 MG tablet, Take by mouth daily., Disp: , Rfl:  .  irbesartan-hydrochlorothiazide (AVALIDE) 150-12.5 MG tablet, Take 1 tablet by mouth daily., Disp: , Rfl:  .  loratadine (CLARITIN) 10 MG tablet, Take 10 mg by mouth daily.  , Disp: , Rfl:  .  meloxicam (MOBIC) 7.5 MG tablet, Take 7.5 mg by mouth daily., Disp: , Rfl: 1 .  metFORMIN (GLUCOPHAGE) 1000 MG tablet, Take 1,000 mg by mouth 2 (two) times daily with a meal.  , Disp: , Rfl:  .  rosuvastatin (CRESTOR) 5 MG tablet, , Disp: , Rfl:  .  TRAVATAN Z 0.004 % SOLN ophthalmic solution, Place 1 drop into both eyes. , Disp: , Rfl: 0 .  triamcinolone cream (KENALOG) 0.1 %, Apply 1 application topically 2 (two) times daily as needed. For irritation, Disp: , Rfl:  .  valsartan-hydrochlorothiazide (DIOVAN-HCT) 160-25 MG tablet, Take 1 tablet by mouth daily., Disp: , Rfl: 0 Allergies  Allergen Reactions  . Lipitor [Atorvastatin]     Infection on leg caused by Lipitor, takes Crestor  at home  . Lisinopril Other (See Comments)    unknown   Social History   Tobacco Use  Smoking Status Former Smoker  Smokeless Tobacco Never Used  Tobacco Comment   quit in Jan 2012   Objective:  There were no vitals filed for this visit. Constitutional Patient is a pleasant 74 y.o. African American female obese in NAD.Marland Kitchen AAO x 3.  Vascular Capillary fill time to digits <3 seconds b/l lower extremities. Palpable pedal pulses b/l LE. Pedal hair absent. Lower extremity skin temperature gradient within normal limits. No pain with calf compression b/l. No cyanosis or clubbing noted.  Neurologic Normal speech. Oriented to person, place, and time. Protective sensation  diminished with 10g monofilament b/l. Vibratory sensation diminished b/l.  Dermatologic Pedal skin with normal turgor, texture and tone bilaterally. No open wounds bilaterally. No interdigital macerations bilaterally. Toenails 1-5 b/l elongated, discolored, dystrophic, thickened, crumbly with subungual debris and tenderness to dorsal palpation.  Orthopedic: Normal muscle strength 5/5 to all lower extremity muscle groups bilaterally. No pain crepitus or joint limitation noted with ROM b/l. Hallux valgus with bunion deformity noted b/l lower extremities.   Assessment:   1. Pain due to onychomycosis of toenails of both feet   2. Diabetic peripheral neuropathy associated with type 2 diabetes mellitus ([place])    Plan:  Patient was evaluated and treated and all questions answered.  Onychomycosis with pain -Nails palliatively debridement as below. -Educated on self-care  Procedure: Nail Debridement Rationale: Pain Type of Debridement: manual, sharp debridement. Instrumentation: Nail nipper, rotary burr. Number of Nails: 10  -Examined patient. -Continue diabetic foot care principles. -Toenails 1-5 b/l were debrided in length and girth with sterile nail nippers and dremel without iatrogenic bleeding.  -Patient to report any pedal injuries to medical professional immediately. -Patient to continue soft, supportive shoe gear daily. -Patient/POA to call should there be question/concern in the interim.  Return in about 3 months (around 07/14/2020) for diabetic foot care.  Marzetta Board, DPM

## 2020-07-20 ENCOUNTER — Ambulatory Visit (INDEPENDENT_AMBULATORY_CARE_PROVIDER_SITE_OTHER): Payer: Medicare HMO | Admitting: Podiatry

## 2020-07-20 ENCOUNTER — Encounter: Payer: Self-pay | Admitting: Podiatry

## 2020-07-20 ENCOUNTER — Other Ambulatory Visit: Payer: Self-pay

## 2020-07-20 DIAGNOSIS — E1142 Type 2 diabetes mellitus with diabetic polyneuropathy: Secondary | ICD-10-CM | POA: Diagnosis not present

## 2020-07-20 DIAGNOSIS — M79675 Pain in left toe(s): Secondary | ICD-10-CM

## 2020-07-20 DIAGNOSIS — B351 Tinea unguium: Secondary | ICD-10-CM | POA: Diagnosis not present

## 2020-07-20 DIAGNOSIS — M79674 Pain in right toe(s): Secondary | ICD-10-CM | POA: Diagnosis not present

## 2020-07-24 NOTE — Progress Notes (Signed)
Subjective:  Patient ID: Samantha [name], female    DOB: 03-11-1946,  MRN: [id number]  74 y.o. female presents with at risk foot care with history of diabetic neuropathy and painful thick toenails that are difficult to trim. Pain interferes with ambulation. Aggravating factors include wearing enclosed shoe gear. Pain is relieved with periodic professional debridement.   Patient states her blood glucose was 105 mg/dl this morning. She voices no new pedal problems on today's visit.   Review of Systems: Negative except as noted in the HPI.  Past Medical History:  Diagnosis Date  . Arthritis    neck   . Constipation    takes an OTC stool softener and uses an enema every3wks  . DM    takes Glipizide and Metformin daily  . DM W/NEURO MNFST, TYPE II, UNCONTROLLED   . Dysrhythmia    " heart Palpatations "  . HEMATURIA, MICROSCOPIC   . High cholesterol    takes Crestor daily  . HYPERTENSION, BENIGN ESSENTIAL    not on any meds for this  . 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 Palpitations   . Thymoma 01/23/2012   Past Surgical History:  Procedure Laterality Date  . Kelman phacoemulsification cataract  2011   bilateral  . MEDIASTERNOTOMY  01/23/2012   Procedure: PARTIAL MEDIASTERNOTOMY;  Surgeon: Rexene Alberts, MD;  Location: [facility];  Service: Thoracic;  Laterality: N/A;  . WEDGE RESECTION  01/23/2012   Procedure: WEDGE RESECTION;  Surgeon: Rexene Alberts, MD;  Location: [facility];  Service: Thoracic;;  Wedge Resection Left Upper Lobe   Patient Active Problem List   Diagnosis Date Noted  . 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 Murmur 11/23/2011  . Preop cardiovascular exam 11/23/2011  . Hyponatremia 10/19/2011  . Dyspnea 06/28/2011  . Palpitations 06/28/2011  . Dyspnea 06/28/2011  . DM 02/17/2007  . DM W/NEURO MNFST, TYPE II, UNCONTROLLED 02/17/2007  . TOBACCO USER 02/17/2007  . HYPERTENSION, BENIGN ESSENTIAL 02/17/2007  .  HEMATURIA, MICROSCOPIC 02/17/2007    Current Outpatient Medications:  .  ACCU-CHEK AVIVA PLUS test strip, , Disp: , Rfl:  .  ACCU-CHEK FASTCLIX LANCETS MISC, , Disp: , Rfl:  .  aspirin 81 MG tablet, Take 81 mg by mouth daily.  , Disp: , Rfl:  .  cholecalciferol (VITAMIN D) 1000 UNITS tablet, Take 1,000 Units by mouth daily., Disp: , Rfl:  .  FLUAD 0.5 ML SUSY, ADM 0.5ML IM UTD, Disp: , Rfl: 0 .  irbesartan (AVAPRO) 150 MG tablet, Take by mouth daily., Disp: , Rfl:  .  irbesartan-hydrochlorothiazide (AVALIDE) 150-12.5 MG tablet, Take 1 tablet by mouth daily., Disp: , Rfl:  .  loratadine (CLARITIN) 10 MG tablet, Take 10 mg by mouth daily.  , Disp: , Rfl:  .  meloxicam (MOBIC) 7.5 MG tablet, Take 7.5 mg by mouth daily., Disp: , Rfl: 1 .  metFORMIN (GLUCOPHAGE) 1000 MG tablet, Take 1,000 mg by mouth 2 (two) times daily with a meal.  , Disp: , Rfl:  .  rosuvastatin (CRESTOR) 5 MG tablet, , Disp: , Rfl:  .  TRAVATAN Z 0.004 % SOLN ophthalmic solution, Place 1 drop into both eyes. , Disp: , Rfl: 0 .  triamcinolone cream (KENALOG) 0.1 %, Apply 1 application topically 2 (two) times daily as needed. For irritation, Disp: , Rfl:  .  valsartan-hydrochlorothiazide (DIOVAN-HCT) 160-25 MG tablet, Take 1 tablet by mouth daily., Disp: , Rfl: 0  Allergies  Allergen Reactions  . Lipitor [Atorvastatin]     Infection on leg caused by Lipitor, takes Crestor at home  . Lisinopril Other (See Comments)    unknown   Social History   Tobacco Use  Smoking Status Former Smoker  Smokeless Tobacco Never Used  Tobacco Comment   quit in Jan 2012   Objective:  There were no vitals filed for this visit. Constitutional Patient is a pleasant 74 y.o. African American female obese in NAD.  AAO x 3.  Vascular Capillary fill time to digits <3 seconds b/l lower extremities. Palpable pedal pulses b/l LE. Pedal hair absent. Lower extremity skin temperature gradient within normal limits. No pain with calf compression b/l. No  cyanosis or clubbing noted.  Neurologic Normal speech. Oriented to person, place, and time. Protective sensation diminished with 10g monofilament b/l. Vibratory sensation diminished b/l.  Dermatologic Pedal skin with normal turgor, texture and tone bilaterally. No open wounds bilaterally. No interdigital macerations bilaterally. Toenails 1-5 b/l elongated, discolored, dystrophic, thickened, crumbly with subungual debris and tenderness to dorsal palpation.  Orthopedic: Normal muscle strength 5/5 to all lower extremity muscle groups bilaterally. No pain crepitus or joint limitation noted with ROM b/l. Hallux valgus with bunion deformity noted b/l lower extremities.   Assessment:   1. Pain due to onychomycosis of toenails of both feet   2. Diabetic peripheral neuropathy associated with type 2 diabetes mellitus ([place])    Plan:  Patient was evaluated and treated and all questions answered.  Onychomycosis with pain -Nails palliatively debridement as below. -Educated on self-care  Procedure: Nail Debridement Rationale: Pain Type of Debridement: manual, sharp debridement. Instrumentation: Nail nipper, rotary burr. Number of Nails: 10  -Examined patient. -Continue diabetic foot care principles. -Toenails 1-5 b/l were debrided in length and girth with sterile nail nippers and dremel without iatrogenic bleeding.  -Patient to report any pedal injuries to medical professional immediately. -Patient to continue soft, supportive shoe gear daily. -Patient/POA to call should there be question/concern in the interim.  Return in about 3 months (around 10/20/2020) for diabetic toenails.  Marzetta Board, DPM

## 2020-10-26 LAB — EXTERNAL GENERIC LAB PROCEDURE

## 2020-10-28 ENCOUNTER — Ambulatory Visit: Payer: Medicare HMO | Admitting: Podiatry

## 2020-10-28 ENCOUNTER — Other Ambulatory Visit: Payer: Self-pay

## 2020-10-28 ENCOUNTER — Encounter: Payer: Self-pay | Admitting: Podiatry

## 2020-10-28 DIAGNOSIS — K219 Gastro-esophageal reflux disease without esophagitis: Secondary | ICD-10-CM | POA: Insufficient documentation

## 2020-10-28 DIAGNOSIS — B351 Tinea unguium: Secondary | ICD-10-CM | POA: Diagnosis not present

## 2020-10-28 DIAGNOSIS — M79674 Pain in right toe(s): Secondary | ICD-10-CM | POA: Diagnosis not present

## 2020-10-28 DIAGNOSIS — E1142 Type 2 diabetes mellitus with diabetic polyneuropathy: Secondary | ICD-10-CM

## 2020-10-28 DIAGNOSIS — M79675 Pain in left toe(s): Secondary | ICD-10-CM

## 2020-10-28 DIAGNOSIS — R635 Abnormal weight gain: Secondary | ICD-10-CM | POA: Insufficient documentation

## 2020-11-02 NOTE — Progress Notes (Signed)
  Subjective:  Patient ID: Samantha [name], female    DOB: Oct 28, 1945,  MRN: [id number]  75 y.o. female presents with at risk foot care with history of diabetic neuropathy and painful thick toenails that are difficult to trim. Pain interferes with ambulation. Aggravating factors include wearing enclosed shoe gear. Pain is relieved with periodic professional debridement.   Patient states her blood glucose was 156 mg/dl this morning. She states she ate some potato chips last night. Patient states her readings usually run 110-120 mg/dl. She voices no new pedal problems on today's visit.   Review of Systems: Negative except as noted in the HPI.   Allergies  Allergen Reactions  . Lipitor [Atorvastatin]     Infection on leg caused by Lipitor, takes Crestor at home  . Lisinopril Other (See Comments)    unknown  . Pioglitazone     Other reaction(s): Unknown   Objective:  There were no vitals filed for this visit. Constitutional Patient is a pleasant 75 y.o. African American female obese in NAD.  AAO x 3.  Vascular Capillary fill time to digits <3 seconds b/l lower extremities. Palpable pedal pulses b/l LE. Pedal hair absent. Lower extremity skin temperature gradient within normal limits. No pain with calf compression b/l. No cyanosis or clubbing noted.  Neurologic Normal speech. Oriented to person, place, and time. Protective sensation diminished with 10g monofilament b/l. Vibratory sensation diminished b/l.  Dermatologic Pedal skin with normal turgor, texture and tone bilaterally. No open wounds bilaterally. No interdigital macerations bilaterally. Toenails 1-5 b/l elongated, discolored, dystrophic, thickened, crumbly with subungual debris and tenderness to dorsal palpation.  Orthopedic: Normal muscle strength 5/5 to all lower extremity muscle groups bilaterally. No pain crepitus or joint limitation noted with ROM b/l. Hallux valgus with bunion deformity noted b/l lower extremities.   Assessment:    1. Pain due to onychomycosis of toenails of both feet   2. Diabetic peripheral neuropathy associated with type 2 diabetes mellitus ([place])    Plan:  Patient was evaluated and treated and all questions answered.  Onychomycosis with pain -Nails palliatively debridement as below. -Educated on self-care  Procedure: Nail Debridement Rationale: Pain Type of Debridement: manual, sharp debridement. Instrumentation: Nail nipper, rotary burr. Number of Nails: 10  -Examined patient. -No new findings. No new orders. -Continue diabetic foot care principles. -Patient to continue soft, supportive shoe gear daily. -Toenails 1-5 b/l were debrided in length and girth with sterile nail nippers and dremel without iatrogenic bleeding.  -Patient to report any pedal injuries to medical professional immediately. -Patient/POA to call should there be question/concern in the interim.  Return in about 3 months (around 01/25/2021).  Marzetta Board, DPM

## 2020-11-09 LAB — COLOGUARD: COLOGUARD: NEGATIVE

## 2021-02-08 ENCOUNTER — Encounter: Payer: Self-pay | Admitting: Podiatry

## 2021-02-08 ENCOUNTER — Ambulatory Visit: Payer: Medicare HMO | Admitting: Podiatry

## 2021-02-08 ENCOUNTER — Other Ambulatory Visit: Payer: Self-pay

## 2021-02-08 DIAGNOSIS — M79675 Pain in left toe(s): Secondary | ICD-10-CM

## 2021-02-08 DIAGNOSIS — M79674 Pain in right toe(s): Secondary | ICD-10-CM

## 2021-02-08 DIAGNOSIS — B351 Tinea unguium: Secondary | ICD-10-CM | POA: Diagnosis not present

## 2021-02-08 DIAGNOSIS — E1142 Type 2 diabetes mellitus with diabetic polyneuropathy: Secondary | ICD-10-CM | POA: Diagnosis not present

## 2021-02-15 NOTE — Progress Notes (Signed)
  Subjective:  Patient ID: Samantha [name], female    DOB: May 14, 1946,  MRN: [id number]  Samantha [name] presents to clinic today for at risk foot care with history of diabetic neuropathy and painful thick toenails that are difficult to trim. Pain interferes with ambulation. Aggravating factors include wearing enclosed shoe gear. Pain is relieved with periodic professional debridement.   Patient states her blood glucose was 131 mg/dl today.   PCP is Dr. Willey Blade. Patient states she is looking for a new PCP.  She voices no new pedal concerns on today's visit.  Allergies  Allergen Reactions  . Lipitor [Atorvastatin]     Infection on leg caused by Lipitor, takes Crestor at home  . Lisinopril Other (See Comments)    unknown  . Pioglitazone     Other reaction(s): Unknown    Review of Systems: Negative except as noted in the HPI. Objective:   Constitutional Samantha [name] is a pleasant 75 y.o. African American female, morbidly obese in NAD. AAO x 3.   Vascular Capillary fill time to digits <3 seconds b/l lower extremities. Palpable DP pulse(s) b/l lower extremities Palpable PT pulse(s) b/l lower extremities Pedal hair absent. Lower extremity skin temperature gradient within normal limits. No pain with calf compression b/l. No cyanosis or clubbing noted.  Neurologic Normal speech. Oriented to person, place, and time. Protective sensation diminished with 10g monofilament b/l. Proprioception intact bilaterally.  Dermatologic Pedal skin with normal turgor, texture and tone bilaterally. No open wounds bilaterally. No interdigital macerations bilaterally. Toenails 1-5 b/l elongated, discolored, dystrophic, thickened, crumbly with subungual debris and tenderness to dorsal palpation.  Orthopedic: Normal muscle strength 5/5 to all lower extremity muscle groups bilaterally. No pain crepitus or joint limitation noted with ROM b/l. Hallux valgus with bunion deformity noted b/l lower  extremities.   Radiographs: None Assessment:  No diagnosis found. Plan:  Patient was evaluated and treated and all questions answered. -Examined patient. -No new findings. No new orders. -Continue diabetic foot care principles. -Patient to continue soft, supportive shoe gear daily. -Toenails 1-5 b/l were debrided in length and girth with sterile nail nippers and dremel without iatrogenic bleeding.  -Patient to report any pedal injuries to medical professional immediately. -Patient/POA to call should there be question/concern in the interim.  Return in about 3 months (around 05/11/2021).  Marzetta Board, DPM

## 2021-04-25 ENCOUNTER — Other Ambulatory Visit: Payer: Self-pay | Admitting: Nurse Practitioner

## 2021-04-25 DIAGNOSIS — Z1231 Encounter for screening mammogram for malignant neoplasm of breast: Secondary | ICD-10-CM

## 2021-05-24 ENCOUNTER — Encounter: Payer: Self-pay | Admitting: Podiatry

## 2021-05-24 ENCOUNTER — Ambulatory Visit: Payer: Medicare HMO | Admitting: Podiatry

## 2021-05-24 ENCOUNTER — Other Ambulatory Visit: Payer: Self-pay

## 2021-05-24 DIAGNOSIS — B351 Tinea unguium: Secondary | ICD-10-CM

## 2021-05-24 DIAGNOSIS — M79674 Pain in right toe(s): Secondary | ICD-10-CM | POA: Diagnosis not present

## 2021-05-24 DIAGNOSIS — E1142 Type 2 diabetes mellitus with diabetic polyneuropathy: Secondary | ICD-10-CM | POA: Diagnosis not present

## 2021-05-24 DIAGNOSIS — M79675 Pain in left toe(s): Secondary | ICD-10-CM

## 2021-05-29 NOTE — Progress Notes (Signed)
  Subjective:  Patient ID: Samantha [name], female    DOB: February 24, 1946,  MRN: [id number]  75 y.o. female presents with at risk foot care with history of diabetic neuropathy and thick, elongated toenails b/l feet which are tender when wearing enclosed shoe gear.Marland Kitchen    PCP: Vonna Drafts, FNP.  Review of Systems: Negative except as noted in the HPI.   Allergies  Allergen Reactions   Lipitor [Atorvastatin]     Infection on leg caused by Lipitor, takes Crestor at home   Lisinopril Other (See Comments)    unknown   Pioglitazone     Other reaction(s): Unknown    Objective:  There were no vitals filed for this visit. Constitutional Patient is a pleasant 75 y.o. African American female in NAD. AAO x 3.  Vascular Capillary fill time to digits <3 seconds b/l.  DP/PT pulse(s) are palpable b/l lower extremities. Pedal hair absent. Lower extremity skin temperature gradient within normal limits. No pain with calf compression b/l. No edema noted b/l lower extremities. No cyanosis or clubbing noted.   Neurologic Protective sensation diminished with 10g monofilament b/l. Proprioception intact bilaterally.  Dermatologic Pedal skin is warm and supple b/l.  No open wounds b/l lower extremities. No interdigital macerations b/l lower extremities. Toenails 1-5 b/l elongated, discolored, dystrophic, thickened, crumbly with subungual debris and tenderness to dorsal palpation. No hyperkeratotic nor porokeratotic lesions present on today's visit.  Orthopedic: Normal muscle strength 5/5 to all lower extremity muscle groups bilaterally. Patient ambulates independent of any assistive aids. Hallux valgus with bunion deformity noted b/l lower extremities.    Assessment:   1. Pain due to onychomycosis of toenails of both feet   2. Diabetic peripheral neuropathy associated with type 2 diabetes mellitus ([place])    Plan:  Patient was evaluated and treated and all questions answered. Consent given for treatment as  described below: -Examined patient. -Continue diabetic foot care principles: inspect feet daily, monitor glucose as recommended by PCP and/or Endocrinologist, and follow prescribed diet per PCP, Endocrinologist and/or dietician. -Patient to continue soft, supportive shoe gear daily. -Toenails 1-5 b/l were debrided in length and girth with sterile nail nippers and dremel without iatrogenic bleeding.  -Patient to report any pedal injuries to medical professional immediately. -Patient/POA to call should there be question/concern in the interim.  Return in about 3 months (around 08/23/2021).  Marzetta Board, DPM

## 2021-08-30 ENCOUNTER — Ambulatory Visit (INDEPENDENT_AMBULATORY_CARE_PROVIDER_SITE_OTHER): Payer: Self-pay | Admitting: Podiatry

## 2021-08-30 DIAGNOSIS — Z91199 Patient's noncompliance with other medical treatment and regimen due to unspecified reason: Secondary | ICD-10-CM

## 2021-09-05 NOTE — Progress Notes (Signed)
   Complete physical exam  Patient: Samantha [name]   DOB: 06/30/1998   75 y.o. Female  MRN: [id number]  Subjective:    No chief complaint on file.   Samantha [name] is a 75 y.o. female who presents today for a complete physical exam. She reports consuming a {diet types:17450} diet. {types:19826} She generally feels {DESC; WELL/FAIRLY WELL/POORLY:18703}. She reports sleeping {DESC; WELL/FAIRLY WELL/POORLY:18703}. She {does/does not:200015} have additional problems to discuss today.    Most recent fall risk assessment:    03/07/2022   10:42 AM  Fall Risk   Falls in the past year? 0  Number falls in past yr: 0  Injury with Fall? 0  Risk for fall due to : No Fall Risks  Follow up Falls evaluation completed     Most recent depression screenings:    03/07/2022   10:42 AM 01/26/2021   10:46 AM  PHQ 2/9 Scores  PHQ - 2 Score 0 0  PHQ- 9 Score 5     {VISON DENTAL STD PSA (Optional):27386}  {History (Optional):23778}  Patient Care Team: Jessup, Joy, NP as PCP - General (Nurse Practitioner)   Outpatient Medications Prior to Visit  Medication Sig   fluticasone (FLONASE) 50 MCG/ACT nasal spray Place 2 sprays into both nostrils in the morning and at bedtime. After 7 days, reduce to once daily.   norgestimate-ethinyl estradiol (SPRINTEC 28) 0.25-35 MG-MCG tablet Take 1 tablet by mouth daily.   Nystatin POWD Apply liberally to affected area 2 times per day   spironolactone (ALDACTONE) 100 MG tablet Take 1 tablet (100 mg total) by mouth daily.   No facility-administered medications prior to visit.    ROS        Objective:     There were no vitals taken for this visit. {Vitals History (Optional):23777}  Physical Exam   No results found for any visits on 04/12/22. {Show previous labs (optional):23779}    Assessment & Plan:    Routine Health Maintenance and Physical Exam  Immunization History  Administered Date(s) Administered   DTaP 09/13/1999, 11/09/1999,  01/18/2000, 10/03/2000, 04/18/2004   Hepatitis A 02/13/2008, 02/18/2009   Hepatitis B 07/01/1999, 08/08/1999, 01/18/2000   HiB (PRP-OMP) 09/13/1999, 11/09/1999, 01/18/2000, 10/03/2000   IPV 09/13/1999, 11/09/1999, 07/08/2000, 04/18/2004   Influenza,inj,Quad PF,6+ Mos 05/21/2014   Influenza-Unspecified 08/20/2012   MMR 07/08/2001, 04/18/2004   Meningococcal Polysaccharide 02/18/2012   Pneumococcal Conjugate-13 10/03/2000   Pneumococcal-Unspecified 01/18/2000, 04/02/2000   Tdap 02/18/2012   Varicella 07/08/2000, 02/13/2008    Health Maintenance  Topic Date Due   HIV Screening  Never done   Hepatitis C Screening  Never done   INFLUENZA VACCINE  04/10/2022   PAP-Cervical Cytology Screening  04/12/2022 (Originally 06/29/2020)   PAP SMEAR-Modifier  04/12/2022 (Originally 06/29/2020)   TETANUS/TDAP  04/12/2022 (Originally 02/17/2022)   HPV VACCINES  Discontinued   COVID-19 Vaccine  Discontinued    Discussed health benefits of physical activity, and encouraged her to engage in regular exercise appropriate for her age and condition.  Problem List Items Addressed This Visit   None Visit Diagnoses     Annual physical exam    -  Primary   Cervical cancer screening       Need for Tdap vaccination          No follow-ups on file.     Joy Jessup, NP   

## 2021-11-03 ENCOUNTER — Encounter: Payer: Self-pay | Admitting: Podiatry

## 2021-11-03 ENCOUNTER — Ambulatory Visit (INDEPENDENT_AMBULATORY_CARE_PROVIDER_SITE_OTHER): Payer: Medicare HMO | Admitting: Podiatry

## 2021-11-03 ENCOUNTER — Other Ambulatory Visit: Payer: Self-pay

## 2021-11-03 DIAGNOSIS — E1142 Type 2 diabetes mellitus with diabetic polyneuropathy: Secondary | ICD-10-CM

## 2021-11-03 DIAGNOSIS — M2041 Other hammer toe(s) (acquired), right foot: Secondary | ICD-10-CM | POA: Diagnosis not present

## 2021-11-03 DIAGNOSIS — M2011 Hallux valgus (acquired), right foot: Secondary | ICD-10-CM

## 2021-11-03 DIAGNOSIS — M2012 Hallux valgus (acquired), left foot: Secondary | ICD-10-CM

## 2021-11-03 DIAGNOSIS — M79675 Pain in left toe(s): Secondary | ICD-10-CM

## 2021-11-03 DIAGNOSIS — M2042 Other hammer toe(s) (acquired), left foot: Secondary | ICD-10-CM

## 2021-11-03 DIAGNOSIS — B351 Tinea unguium: Secondary | ICD-10-CM | POA: Diagnosis not present

## 2021-11-03 DIAGNOSIS — M79674 Pain in right toe(s): Secondary | ICD-10-CM

## 2021-11-09 NOTE — Progress Notes (Signed)
ANNUAL DIABETIC FOOT EXAM  Subjective: Samantha [name] presents today for annual diabetic foot examination.  Patient relates 30 year h/o diabetes.  Patient denies any h/o foot wounds.  Patient has itching of feet on occasion.  Patient's blood sugar was 164 mg/dl today. She does not remember her last A1c and there is no A1c available in labs.  Risk factors:  high cholesterol, diabetes, diabetic neuropathy, HTN.  Vonna Drafts, FNP is patient's PCP. Last visit was December, 2022.  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   noted on CT angiogram of chest   Palpitations    Thymoma 01/23/2012   Patient Active Problem List   Diagnosis Date Noted   Abnormal weight gain 10/28/2020   Gastroesophageal reflux disease 10/28/2020   Morbid obesity ([place]) 10/28/2020   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   Thymoma 01/23/2012   Murmur 11/23/2011   Preop cardiovascular exam 11/23/2011   Hyponatremia 10/19/2011   Dyspnea 06/28/2011   Palpitations 06/28/2011   Dyspnea 06/28/2011   DM 02/17/2007   DM W/NEURO MNFST, TYPE II, UNCONTROLLED 02/17/2007   TOBACCO USER 02/17/2007   HYPERTENSION, BENIGN ESSENTIAL 02/17/2007   HEMATURIA, MICROSCOPIC 02/17/2007   Past Surgical History:  Procedure Laterality Date   [place] phacoemulsification cataract  2011   bilateral   MEDIASTERNOTOMY  01/23/2012   Procedure: PARTIAL MEDIASTERNOTOMY;  Surgeon: Rexene Alberts, MD;  Location: [place];  Service: Thoracic;  Laterality: N/A;   WEDGE RESECTION  01/23/2012   Procedure: WEDGE RESECTION;  Surgeon: Rexene Alberts, MD;   Location: [place];  Service: Thoracic;;  Wedge Resection Left Upper Lobe   Current Outpatient Medications on File Prior to Visit  Medication Sig Dispense Refill   ACCU-CHEK AVIVA PLUS test strip      ACCU-CHEK FASTCLIX LANCETS MISC      Aspirin 81 MG CAPS      aspirin 81 MG tablet Take 81 mg by mouth daily.       cholecalciferol (VITAMIN D) 1000 UNITS tablet Take 1,000 Units by mouth daily.     FARXIGA 5 MG TABS tablet Take 5 mg by mouth daily.     FLUAD 0.5 ML SUSY ADM 0.5ML IM UTD  0   GLIPIZIDE PO      irbesartan (AVAPRO) 150 MG tablet Take by mouth daily.     irbesartan-hydrochlorothiazide (AVALIDE) 150-12.5 MG tablet Take 1 tablet by mouth daily.     JARDIANCE 10 MG TABS tablet Take 10 mg by mouth daily.     loratadine (CLARITIN) 10 MG tablet Take 10 mg by mouth daily.       LORATADINE PO      meloxicam (MOBIC) 7.5 MG tablet Take 7.5 mg by mouth daily.  1   metFORMIN (GLUCOPHAGE) 1000 MG tablet Take 1,000 mg by mouth 2 (two) times daily with a meal.       METFORMIN HCL PO      rosuvastatin (CRESTOR) 5 MG tablet  Rosuvastatin Calcium (CRESTOR PO)      TRAVATAN Z 0.004 % SOLN ophthalmic solution Place 1 drop into both eyes.   0   triamcinolone cream (KENALOG) 0.1 % Apply 1 application topically 2 (two) times daily as needed. For irritation     triamcinolone ointment (KENALOG) 0.1 % Apply 1 application topically 2 (two) times daily.     valsartan-hydrochlorothiazide (DIOVAN-HCT) 160-25 MG tablet Take 1 tablet by mouth daily.  0   No current facility-administered medications on file prior to visit.    Allergies  Allergen Reactions   Lipitor [Atorvastatin]     Infection on leg caused by Lipitor, takes Crestor at home   Lisinopril Other (See Comments)    unknown   Pioglitazone     Other reaction(s): Unknown   Social History   Occupational History   Not on file  Tobacco Use   Smoking status: Former   Smokeless tobacco: Never   Tobacco comments:    quit in Jan 2012   Substance and Sexual Activity   Alcohol use: No    Alcohol/week: 0.0 standard drinks   Drug use: No   Sexual activity: Never    Birth control/protection: Post-menopausal   Family History  Problem Relation Age of Onset   Anesthesia problems Neg Hx    Hypotension Neg Hx    Malignant hyperthermia Neg Hx    Pseudochol deficiency Neg Hx    Diabetes Other    Immunization History  Administered Date(s) Administered   Influenza, High Dose Seasonal PF 05/18/2017, 06/06/2019   Pneumococcal Polysaccharide-23 09/14/2005   Td 09/14/2005     Review of Systems: Negative except as noted in the HPI.   Objective: There were no vitals filed for this visit.  MARDELLA [name] is a pleasant 76 y.o. female in NAD. AAO X 3.  Vascular Examination: CFT <3 seconds b/l LE. Palpable DP pulse(s) b/l LE. Diminished PT pulse(s) b/l LE. Pedal hair absent. No pain with calf compression b/l. Lower extremity skin temperature gradient within normal limits. No ischemia or gangrene noted b/l LE. No cyanosis or clubbing noted b/l LE.  Dermatological Examination: Pedal integument with normal turgor, texture and tone BLE. No open wounds b/l LE. No interdigital macerations noted b/l LE. Toenails 1-5 bilaterally elongated, discolored, dystrophic, thickened, and crumbly with subungual debris and tenderness to dorsal palpation. No hyperkeratotic nor porokeratotic lesions present on today's visit.  Neurological Examination: Protective sensation diminished with 10g monofilament b/l.  Musculoskeletal Examination: Muscle strength 5/5 to all lower extremity muscle groups bilaterally. HAV with bunion deformity noted b/l LE. Hammertoe deformity noted 2-5 b/l.  Footwear Assessment: Does the patient wear appropriate shoes? Yes. Does the patient need inserts/orthotics? Yes.  Assessment: 1. Pain due to onychomycosis of toenails of both feet   2. Hallux valgus, acquired, bilateral   3. Acquired hammertoes of both feet   4.  Diabetic peripheral neuropathy associated with type 2 diabetes mellitus (HCC)      ADA Risk Categorization: High Risk  Patient has one or more of the following: Loss of protective sensation Absent pedal pulses Severe Foot deformity History of foot ulcer  Plan: -Diabetic foot examination performed today. -Continue foot and shoe inspections daily. Monitor blood glucose per PCP/Endocrinologist's recommendations. -Mycotic toenails 1-5 bilaterally were debrided in length and girth with sterile nail nippers and dremel without incident. -Patient/POA to call should there be question/concern in the interim. Return in about 3 months (around 01/31/2022).  Marzetta Board, DPM

## 2022-01-26 ENCOUNTER — Other Ambulatory Visit: Payer: Self-pay

## 2022-01-26 ENCOUNTER — Inpatient Hospital Stay (HOSPITAL_COMMUNITY)
Admission: EM | Admit: 2022-01-26 | Discharge: 2022-01-29 | DRG: 641 | Disposition: A | Discharge status: Home / Self Care | Payer: Medicare HMO | Attending: Internal Medicine | Admitting: Internal Medicine

## 2022-01-26 ENCOUNTER — Encounter (HOSPITAL_COMMUNITY): Payer: Self-pay | Admitting: Emergency Medicine

## 2022-01-26 DIAGNOSIS — R631 Polydipsia: Secondary | ICD-10-CM | POA: Diagnosis present

## 2022-01-26 DIAGNOSIS — K5900 Constipation, unspecified: Secondary | ICD-10-CM

## 2022-01-26 DIAGNOSIS — D649 Anemia, unspecified: Secondary | ICD-10-CM

## 2022-01-26 DIAGNOSIS — E1122 Type 2 diabetes mellitus with diabetic chronic kidney disease: Secondary | ICD-10-CM | POA: Diagnosis present

## 2022-01-26 DIAGNOSIS — Z7982 Long term (current) use of aspirin: Secondary | ICD-10-CM

## 2022-01-26 DIAGNOSIS — N1831 Chronic kidney disease, stage 3a: Secondary | ICD-10-CM | POA: Diagnosis present

## 2022-01-26 DIAGNOSIS — E1169 Type 2 diabetes mellitus with other specified complication: Secondary | ICD-10-CM | POA: Diagnosis present

## 2022-01-26 DIAGNOSIS — Z87891 Personal history of nicotine dependence: Secondary | ICD-10-CM

## 2022-01-26 DIAGNOSIS — E871 Hypo-osmolality and hyponatremia: Secondary | ICD-10-CM | POA: Diagnosis not present

## 2022-01-26 DIAGNOSIS — Z6837 Body mass index (BMI) 37.0-37.9, adult: Secondary | ICD-10-CM

## 2022-01-26 DIAGNOSIS — I10 Essential (primary) hypertension: Secondary | ICD-10-CM | POA: Diagnosis present

## 2022-01-26 DIAGNOSIS — Z791 Long term (current) use of non-steroidal anti-inflammatories (NSAID): Secondary | ICD-10-CM

## 2022-01-26 DIAGNOSIS — E7800 Pure hypercholesterolemia, unspecified: Secondary | ICD-10-CM | POA: Diagnosis present

## 2022-01-26 DIAGNOSIS — Z79899 Other long term (current) drug therapy: Secondary | ICD-10-CM

## 2022-01-26 DIAGNOSIS — N179 Acute kidney failure, unspecified: Secondary | ICD-10-CM

## 2022-01-26 DIAGNOSIS — I129 Hypertensive chronic kidney disease with stage 1 through stage 4 chronic kidney disease, or unspecified chronic kidney disease: Secondary | ICD-10-CM | POA: Diagnosis present

## 2022-01-26 DIAGNOSIS — E861 Hypovolemia: Secondary | ICD-10-CM | POA: Diagnosis present

## 2022-01-26 DIAGNOSIS — E669 Obesity, unspecified: Secondary | ICD-10-CM | POA: Diagnosis present

## 2022-01-26 LAB — BASIC METABOLIC PANEL
Anion gap: 17 — ABNORMAL HIGH (ref 5–15)
BUN: 27 mg/dL — ABNORMAL HIGH (ref 8–23)
CO2: 22 mmol/L (ref 22–32)
Calcium: 9.3 mg/dL (ref 8.9–10.3)
Chloride: 76 mmol/L — ABNORMAL LOW (ref 98–111)
Creatinine, Ser: 1.41 mg/dL — ABNORMAL HIGH (ref 0.44–1.00)
GFR, Estimated: 39 mL/min — ABNORMAL LOW (ref >60)
Glucose, Bld: 175 mg/dL — ABNORMAL HIGH (ref 70–99)
Potassium: 3.6 mmol/L (ref 3.5–5.1)
Sodium: 115 mmol/L — CL (ref 135–145)

## 2022-01-26 LAB — CBC WITH DIFFERENTIAL/PLATELET
Abs Immature Granulocytes: 0.03 10*3/uL (ref 0.00–0.07)
Basophils Absolute: 0 10*3/uL (ref 0.0–0.1)
Basophils Relative: 0 %
Eosinophils Absolute: 0.1 10*3/uL (ref 0.0–0.5)
Eosinophils Relative: 1 %
HCT: 33.2 % — ABNORMAL LOW (ref 36.0–46.0)
Hemoglobin: 11.4 g/dL — ABNORMAL LOW (ref 12.0–15.0)
Immature Granulocytes: 0 %
Lymphocytes Relative: 25 %
Lymphs Abs: 2.2 10*3/uL (ref 0.7–4.0)
MCH: 29 pg (ref 26.0–34.0)
MCHC: 34.3 g/dL (ref 30.0–36.0)
MCV: 84.5 fL (ref 80.0–100.0)
Monocytes Absolute: 0.8 10*3/uL (ref 0.1–1.0)
Monocytes Relative: 9 %
Neutro Abs: 5.8 10*3/uL (ref 1.7–7.7)
Neutrophils Relative %: 65 %
Platelets: 326 10*3/uL (ref 150–400)
RBC: 3.93 MIL/uL (ref 3.87–5.11)
RDW: 12.5 % (ref 11.5–15.5)
WBC: 8.9 10*3/uL (ref 4.0–10.5)
nRBC: 0 % (ref 0.0–0.2)

## 2022-01-26 NOTE — ED Provider Triage Note (Signed)
  Emergency Medicine Provider Triage Evaluation Note  MRN:  [id number]  Arrival date & time: 01/26/22    Medically screening exam initiated at 10:41 PM.   CC:   Constipation   HPI:  Samantha [name] is a 76 y.o. year-old female presents to the ED with chief complaint of constipation x 6 days.  Denies having had a BM.  Has been taking meds, but she doesn't know which ones and doesn't have them with her in triage.  Denies fever, nausea, or vomiting.  Denies abdominal pain.  History provided by patient. ROS:  -As included in HPI PE:  There were no vitals filed for this visit.  Non-toxic appearing No respiratory distress  MDM:  Based on signs and symptoms, constipation is highest on my differential. I've ordered screening labs in triage to expedite lab/diagnostic workup.  Patient was informed that the remainder of the evaluation will be completed by another provider, this initial triage assessment does not replace that evaluation, and the importance of remaining in the ED until their evaluation is complete.    Montine Circle, PA-C 01/26/22 2242

## 2022-01-26 NOTE — ED Triage Notes (Signed)
Patient reports persistent constipation for several days last BM last week unrelieved by OTC laxative/stool softener. Denies abdominal pain or emesis .

## 2022-01-27 ENCOUNTER — Emergency Department (HOSPITAL_COMMUNITY): Payer: Medicare HMO

## 2022-01-27 ENCOUNTER — Encounter (HOSPITAL_COMMUNITY): Payer: Self-pay | Admitting: Internal Medicine

## 2022-01-27 DIAGNOSIS — E7800 Pure hypercholesterolemia, unspecified: Secondary | ICD-10-CM | POA: Diagnosis present

## 2022-01-27 DIAGNOSIS — E1169 Type 2 diabetes mellitus with other specified complication: Secondary | ICD-10-CM | POA: Diagnosis not present

## 2022-01-27 DIAGNOSIS — N179 Acute kidney failure, unspecified: Secondary | ICD-10-CM | POA: Diagnosis present

## 2022-01-27 DIAGNOSIS — E871 Hypo-osmolality and hyponatremia: Secondary | ICD-10-CM | POA: Diagnosis present

## 2022-01-27 DIAGNOSIS — N1831 Chronic kidney disease, stage 3a: Secondary | ICD-10-CM | POA: Diagnosis present

## 2022-01-27 DIAGNOSIS — I129 Hypertensive chronic kidney disease with stage 1 through stage 4 chronic kidney disease, or unspecified chronic kidney disease: Secondary | ICD-10-CM | POA: Diagnosis present

## 2022-01-27 DIAGNOSIS — D649 Anemia, unspecified: Secondary | ICD-10-CM | POA: Diagnosis present

## 2022-01-27 DIAGNOSIS — R631 Polydipsia: Secondary | ICD-10-CM | POA: Diagnosis present

## 2022-01-27 DIAGNOSIS — E861 Hypovolemia: Secondary | ICD-10-CM | POA: Diagnosis present

## 2022-01-27 DIAGNOSIS — E669 Obesity, unspecified: Secondary | ICD-10-CM | POA: Diagnosis present

## 2022-01-27 DIAGNOSIS — Z791 Long term (current) use of non-steroidal anti-inflammatories (NSAID): Secondary | ICD-10-CM | POA: Diagnosis not present

## 2022-01-27 DIAGNOSIS — Z6837 Body mass index (BMI) 37.0-37.9, adult: Secondary | ICD-10-CM | POA: Diagnosis not present

## 2022-01-27 DIAGNOSIS — Z7982 Long term (current) use of aspirin: Secondary | ICD-10-CM | POA: Diagnosis not present

## 2022-01-27 DIAGNOSIS — K5900 Constipation, unspecified: Secondary | ICD-10-CM | POA: Diagnosis present

## 2022-01-27 DIAGNOSIS — I10 Essential (primary) hypertension: Secondary | ICD-10-CM | POA: Diagnosis not present

## 2022-01-27 DIAGNOSIS — E1122 Type 2 diabetes mellitus with diabetic chronic kidney disease: Secondary | ICD-10-CM | POA: Diagnosis present

## 2022-01-27 DIAGNOSIS — Z87891 Personal history of nicotine dependence: Secondary | ICD-10-CM | POA: Diagnosis not present

## 2022-01-27 DIAGNOSIS — Z79899 Other long term (current) drug therapy: Secondary | ICD-10-CM | POA: Diagnosis not present

## 2022-01-27 LAB — URINALYSIS, COMPLETE (UACMP) WITH MICROSCOPIC
Bilirubin Urine: NEGATIVE
Glucose, UA: >=500 mg/dL — AB
Ketones, ur: 5 mg/dL — AB
Leukocytes,Ua: NEGATIVE
Nitrite: NEGATIVE
Protein, ur: NEGATIVE mg/dL
Specific Gravity, Urine: 1.008 (ref 1.005–1.030)
pH: 5 (ref 5.0–8.0)

## 2022-01-27 LAB — CBC WITH DIFFERENTIAL/PLATELET
Abs Immature Granulocytes: 0.02 10*3/uL (ref 0.00–0.07)
Basophils Absolute: 0 10*3/uL (ref 0.0–0.1)
Basophils Relative: 0 %
Eosinophils Absolute: 0.1 10*3/uL (ref 0.0–0.5)
Eosinophils Relative: 1 %
HCT: 32.6 % — ABNORMAL LOW (ref 36.0–46.0)
Hemoglobin: 11.4 g/dL — ABNORMAL LOW (ref 12.0–15.0)
Immature Granulocytes: 0 %
Lymphocytes Relative: 29 %
Lymphs Abs: 2.1 10*3/uL (ref 0.7–4.0)
MCH: 29.5 pg (ref 26.0–34.0)
MCHC: 35 g/dL (ref 30.0–36.0)
MCV: 84.5 fL (ref 80.0–100.0)
Monocytes Absolute: 0.7 10*3/uL (ref 0.1–1.0)
Monocytes Relative: 10 %
Neutro Abs: 4.4 10*3/uL (ref 1.7–7.7)
Neutrophils Relative %: 60 %
Platelets: 294 10*3/uL (ref 150–400)
RBC: 3.86 MIL/uL — ABNORMAL LOW (ref 3.87–5.11)
RDW: 12.7 % (ref 11.5–15.5)
WBC: 7.4 10*3/uL (ref 4.0–10.5)
nRBC: 0 % (ref 0.0–0.2)

## 2022-01-27 LAB — BASIC METABOLIC PANEL
Anion gap: 11 (ref 5–15)
BUN: 22 mg/dL (ref 8–23)
CO2: 26 mmol/L (ref 22–32)
Calcium: 9.5 mg/dL (ref 8.9–10.3)
Chloride: 86 mmol/L — ABNORMAL LOW (ref 98–111)
Creatinine, Ser: 1.14 mg/dL — ABNORMAL HIGH (ref 0.44–1.00)
GFR, Estimated: 51 mL/min — ABNORMAL LOW (ref >60)
Glucose, Bld: 147 mg/dL — ABNORMAL HIGH (ref 70–99)
Potassium: 3.7 mmol/L (ref 3.5–5.1)
Sodium: 123 mmol/L — ABNORMAL LOW (ref 135–145)

## 2022-01-27 LAB — CBG MONITORING, ED: Glucose-Capillary: 158 mg/dL — ABNORMAL HIGH (ref 70–99)

## 2022-01-27 LAB — SODIUM, URINE, RANDOM: Sodium, Ur: 16 mmol/L

## 2022-01-27 LAB — COMPREHENSIVE METABOLIC PANEL
ALT: 21 U/L (ref 0–44)
AST: 40 U/L (ref 15–41)
Albumin: 3.5 g/dL (ref 3.5–5.0)
Alkaline Phosphatase: 53 U/L (ref 38–126)
Anion gap: 13 (ref 5–15)
BUN: 23 mg/dL (ref 8–23)
CO2: 26 mmol/L (ref 22–32)
Calcium: 9.6 mg/dL (ref 8.9–10.3)
Chloride: 83 mmol/L — ABNORMAL LOW (ref 98–111)
Creatinine, Ser: 1.2 mg/dL — ABNORMAL HIGH (ref 0.44–1.00)
GFR, Estimated: 48 mL/min — ABNORMAL LOW (ref >60)
Glucose, Bld: 131 mg/dL — ABNORMAL HIGH (ref 70–99)
Potassium: 3.9 mmol/L (ref 3.5–5.1)
Sodium: 122 mmol/L — ABNORMAL LOW (ref 135–145)
Total Bilirubin: 1.2 mg/dL (ref 0.3–1.2)
Total Protein: 7 g/dL (ref 6.5–8.1)

## 2022-01-27 LAB — TSH: TSH: 1.615 u[IU]/mL (ref 0.350–4.500)

## 2022-01-27 LAB — GLUCOSE, CAPILLARY
Glucose-Capillary: 275 mg/dL — ABNORMAL HIGH (ref 70–99)
Glucose-Capillary: 161 mg/dL — ABNORMAL HIGH (ref 70–99)

## 2022-01-27 LAB — CREATININE, URINE, RANDOM: Creatinine, Urine: 14.88 mg/dL

## 2022-01-27 LAB — OSMOLALITY, URINE: Osmolality, Ur: 160 mOsm/kg — ABNORMAL LOW (ref 300–900)

## 2022-01-27 LAB — POC OCCULT BLOOD, ED: Fecal Occult Bld: NEGATIVE

## 2022-01-27 LAB — MAGNESIUM: Magnesium: 1.7 mg/dL (ref 1.7–2.4)

## 2022-01-27 LAB — OSMOLALITY: Osmolality: 262 mOsm/kg — ABNORMAL LOW (ref 275–295)

## 2022-01-27 MED ORDER — INSULIN ASPART 100 UNIT/ML IJ SOLN
0.0000 [IU] | Freq: Every day | INTRAMUSCULAR | Status: DC
  Administered 2022-01-28: 3 [IU] via SUBCUTANEOUS

## 2022-01-27 MED ORDER — LORATADINE 10 MG PO TABS
10.0000 mg | ORAL_TABLET | Freq: Every day | ORAL | Status: DC
  Administered 2022-01-27 – 2022-01-29 (×3): 10 mg via ORAL
  Filled 2022-01-27 (×2): qty 1

## 2022-01-27 MED ORDER — IOHEXOL 300 MG/ML  SOLN
80.0000 mL | Freq: Once | INTRAMUSCULAR | Status: AC | PRN
  Administered 2022-01-27: 80 mL via INTRAVENOUS

## 2022-01-27 MED ORDER — SODIUM CHLORIDE 0.9% FLUSH
3.0000 mL | Freq: Two times a day (BID) | INTRAVENOUS | Status: DC
  Administered 2022-01-27 – 2022-01-29 (×4): 3 mL via INTRAVENOUS

## 2022-01-27 MED ORDER — SODIUM CHLORIDE 0.9 % IV SOLN
Freq: Once | INTRAVENOUS | Status: AC
Start: 2022-01-27 — End: 2022-01-27

## 2022-01-27 MED ORDER — DOCUSATE SODIUM 100 MG PO CAPS
100.0000 mg | ORAL_CAPSULE | Freq: Two times a day (BID) | ORAL | Status: DC
  Administered 2022-01-27 – 2022-01-29 (×5): 100 mg via ORAL
  Filled 2022-01-27 (×4): qty 1

## 2022-01-27 MED ORDER — ACETAMINOPHEN 650 MG RE SUPP: 650.0000 mg | Freq: Four times a day (QID) | RECTAL | Status: DC | PRN

## 2022-01-27 MED ORDER — LACTATED RINGERS IV SOLN: INTRAVENOUS | Status: DC

## 2022-01-27 MED ORDER — ASPIRIN 81 MG PO CHEW
81.0000 mg | CHEWABLE_TABLET | Freq: Every day | ORAL | Status: DC
  Administered 2022-01-27 – 2022-01-29 (×3): 81 mg via ORAL
  Filled 2022-01-27 (×2): qty 1

## 2022-01-27 MED ORDER — BISACODYL 5 MG PO TBEC
5.0000 mg | DELAYED_RELEASE_TABLET | Freq: Once | ORAL | Status: AC
  Administered 2022-01-27: 5 mg via ORAL

## 2022-01-27 MED ORDER — ROSUVASTATIN CALCIUM 5 MG PO TABS
5.0000 mg | ORAL_TABLET | Freq: Every day | ORAL | Status: DC
  Administered 2022-01-27 – 2022-01-29 (×3): 5 mg via ORAL
  Filled 2022-01-27 (×2): qty 1

## 2022-01-27 MED ORDER — SODIUM CHLORIDE 0.9 % IV SOLN: INTRAVENOUS | Status: AC

## 2022-01-27 MED ORDER — POLYETHYLENE GLYCOL 3350 17 G PO PACK
17.0000 g | PACK | Freq: Every day | ORAL | Status: DC | PRN
  Administered 2022-01-28: 17 g via ORAL
  Filled 2022-01-27: qty 1

## 2022-01-27 MED ORDER — ACETAMINOPHEN 325 MG PO TABS: 650.0000 mg | ORAL_TABLET | Freq: Four times a day (QID) | ORAL | Status: DC | PRN

## 2022-01-27 MED ORDER — ENOXAPARIN SODIUM 40 MG/0.4ML IJ SOSY
40.0000 mg | PREFILLED_SYRINGE | INTRAMUSCULAR | Status: DC
  Administered 2022-01-27 – 2022-01-28 (×2): 40 mg via SUBCUTANEOUS
  Filled 2022-01-27 (×2): qty 0.4

## 2022-01-27 MED ORDER — ONDANSETRON HCL 4 MG/2ML IJ SOLN
4.0000 mg | Freq: Four times a day (QID) | INTRAMUSCULAR | Status: DC | PRN
Start: 2022-01-27 — End: 2022-01-29

## 2022-01-27 MED ORDER — HYDRALAZINE HCL 20 MG/ML IJ SOLN: 5.0000 mg | INTRAMUSCULAR | Status: DC | PRN

## 2022-01-27 MED ORDER — INSULIN ASPART 100 UNIT/ML IJ SOLN
0.0000 [IU] | Freq: Three times a day (TID) | INTRAMUSCULAR | Status: DC
  Administered 2022-01-27: 8 [IU] via SUBCUTANEOUS
  Administered 2022-01-29: 3 [IU] via SUBCUTANEOUS

## 2022-01-27 NOTE — ED Provider Notes (Signed)
[facility] EMERGENCY DEPARTMENT Provider Note   CSN: [id number] Arrival date & time: 01/26/22  2156     History  Chief Complaint  Patient presents with   Constipation    Samantha [name] is a 76 y.o. female.  76 year old female presents the ER today with constipation.  Patient states she has a history of the same.  She states she had disimpactions in the past.  She states that she has not had a bowel movement in about 6 days.  Her daughter corroborates this.  Patient does not eat much salt and also does not drink a lot of fluids.  States that she feels like she is got some stool in her rectum but is not able to get it out.  She did have emesis 1 time last night but not persistently.  No abdominal distention.  No other pain anywhere else.  No other associated symptoms.   Constipation     Home Medications Prior to Admission medications   Medication Sig Start Date End Date Taking? Authorizing Provider  aspirin 81 MG tablet Take 81 mg by mouth daily.     Yes [provider]  cholecalciferol (VITAMIN D) 1000 UNITS tablet Take 1,000 Units by mouth daily.   Yes [provider]  FARXIGA 5 MG TABS tablet Take 10 mg by mouth daily. 10/09/21  Yes [provider]  irbesartan-hydrochlorothiazide (AVALIDE) 150-12.5 MG tablet Take 1 tablet by mouth daily.   Yes [provider]  loratadine (CLARITIN) 10 MG tablet Take 10 mg by mouth daily.     Yes [provider]  meloxicam (MOBIC) 7.5 MG tablet Take 7.5 mg by mouth daily. 04/26/18  Yes [provider]  polyethylene glycol (MIRALAX / GLYCOLAX) 17 g packet Take 17 g by mouth See admin instructions. Monday, Wednesday, Friday   Yes [provider]  rosuvastatin (CRESTOR) 5 MG tablet Take 5 mg by mouth daily. 04/25/16  Yes [provider]  ACCU-CHEK AVIVA PLUS test strip  11/03/13   [provider]  ACCU-CHEK FASTCLIX LANCETS [place]  10/29/13   [provider]  JARDIANCE 10 MG TABS tablet Take 10 mg by mouth daily. Patient not taking: Reported on 01/27/2022 10/09/21   [provider]  LORATADINE PO     [provider]  metFORMIN (GLUCOPHAGE) 1000 MG tablet Take 1,000 mg by mouth 2 (two) times daily with a meal.   Patient not taking: Reported on 01/27/2022    [provider]  TRAVATAN Z 0.004 % SOLN ophthalmic solution Place 1 drop into both eyes.  Patient not taking: Reported on 01/27/2022 10/08/15   [provider]  triamcinolone cream (KENALOG) 0.1 % Apply 1 application topically 2 (two) times daily as needed. For irritation Patient not taking: Reported on 01/27/2022    [provider]  valsartan-hydrochlorothiazide (DIOVAN-HCT) 160-25 MG tablet Take 1 tablet by mouth daily. Patient not taking: Reported on 01/27/2022 10/08/15   [provider]      Allergies    Lipitor [atorvastatin], Lisinopril, and Pioglitazone    Review of Systems   Review of Systems  Gastrointestinal:  Positive for constipation.   Physical Exam Updated Vital Signs BP (!) 124/50   Pulse 92   Temp 97.9 F (36.6 C) (Oral)   Resp 16   SpO2 99%  Physical Exam Vitals and nursing note reviewed.  Constitutional:      Appearance: She is well-developed.  HENT:     Head: Normocephalic and  atraumatic.     Mouth/Throat:     Mouth: Mucous membranes are moist.     Pharynx: Oropharynx is clear.  Eyes:     Pupils: Pupils are equal, round, and reactive to light.  Cardiovascular:     Rate and Rhythm: Normal rate and regular rhythm.  Pulmonary:     Effort: Pulmonary effort is normal. No respiratory distress.     Breath sounds: No stridor.  Abdominal:     General: Abdomen is flat. There is no distension.  Genitourinary:    Comments: Rectal exam chaperoned by nurse [place].  No impaction.  No other obvious abnormalities on rectal exam. Musculoskeletal:     Cervical back: Normal range of motion.  Skin:     General: Skin is warm and dry.  Neurological:     General: No focal deficit present.     Mental Status: She is alert.    ED Results / Procedures / Treatments   Labs (all labs ordered are listed, but only abnormal results are displayed) Labs Reviewed  CBC WITH DIFFERENTIAL/PLATELET - Abnormal; Notable for the following components:      Result Value   Hemoglobin 11.4 (*)    HCT 33.2 (*)    All other components within normal limits  BASIC METABOLIC PANEL - Abnormal; Notable for the following components:   Sodium 115 (*)    Chloride 76 (*)    Glucose, Bld 175 (*)    BUN 27 (*)    Creatinine, Ser 1.41 (*)    GFR, Estimated 39 (*)    Anion gap 17 (*)    All other components within normal limits  BASIC METABOLIC PANEL  MAGNESIUM  COMPREHENSIVE METABOLIC PANEL  CBC WITH DIFFERENTIAL/PLATELET  POC OCCULT BLOOD, ED    EKG None  Radiology CT ABDOMEN PELVIS W CONTRAST  Result Date: 01/27/2022 CLINICAL DATA:  Bowel obstruction suspected.  Constipation EXAM: CT ABDOMEN AND PELVIS WITH CONTRAST TECHNIQUE: Multidetector CT imaging of the abdomen and pelvis was performed using the standard protocol following bolus administration of intravenous contrast. RADIATION DOSE REDUCTION: This exam was performed according to the departmental dose-optimization program which includes automated exposure control, adjustment of the mA and/or kV according to patient size and/or use of iterative reconstruction technique. CONTRAST:  37m OMNIPAQUE IOHEXOL 300 MG/ML  SOLN COMPARISON:  11/14/2006 FINDINGS: Lower chest: No acute abnormality. Hepatobiliary: No focal liver abnormality is seen. No gallstones, gallbladder wall thickening, or biliary dilatation. Pancreas: Mildly atrophic but otherwise unremarkable Spleen: Unremarkable Adrenals/Urinary Tract: The adrenal glands are unremarkable. The kidneys are normal in size and position. Multiple simple cortical cysts are seen within the kidneys bilaterally. No follow-up  imaging is recommended for these lesions. No enhancing intrarenal masses are seen. No hydronephrosis. No intrarenal or ureteral calculi. The bladder is unremarkable. Stomach/Bowel: Stomach is within normal limits. Appendix appears normal. No evidence of bowel wall thickening, distention, or inflammatory changes. Small stool burden. Vascular/Lymphatic: Aortic atherosclerosis. No enlarged abdominal or pelvic lymph nodes. Reproductive: Uterus and bilateral adnexa are unremarkable. Other: Small fat containing umbilical hernia Musculoskeletal: No acute bone abnormality. No lytic or blastic bone lesions. IMPRESSION: No acute intra-abdominal pathology identified. No definite radiographic explanation for the patient's reported symptoms. Small stool burden. Aortic Atherosclerosis (ICD10-I70.0). Electronically Signed   By: AFidela SalisburyM.D.   On: 01/27/2022 02:37    Procedures .Critical Care Performed by: MMerrily Pew MD Authorized by: MMerrily Pew MD   Critical care provider statement:    Critical care time (minutes):  30   Critical care was necessary to treat or prevent imminent or life-threatening deterioration of the following conditions:  Endocrine crisis   Critical care was time spent personally by me on the following activities:  Development of treatment plan with patient or surrogate, discussions with consultants, evaluation of patient's response to treatment, examination of patient, ordering and review of laboratory studies, ordering and review of radiographic studies, ordering and performing treatments and interventions, pulse oximetry, re-evaluation of patient's condition and review of old charts    Medications Ordered in ED Medications  acetaminophen (TYLENOL) tablet 650 mg (has no administration in time range)    Or  acetaminophen (TYLENOL) suppository 650 mg (has no administration in time range)  0.9 %  sodium chloride infusion ( Intravenous New Bag/Given 01/27/22 0437)  ondansetron  (ZOFRAN) injection 4 mg (has no administration in time range)  0.9 %  sodium chloride infusion (0 mLs Intravenous Stopped 01/27/22 0436)  iohexol (OMNIPAQUE) 300 MG/ML solution 80 mL (80 mLs Intravenous Contrast Given 01/27/22 0224)    ED Course/ Medical Decision Making/ A&P                           Medical Decision Making Amount and/or Complexity of Data Reviewed Radiology: ordered.  Risk Prescription drug management. Decision regarding hospitalization.   Patient CT scan negative for any bowel obstruction or other acute abdominal etiologies.  Her labs were reviewed by myself and do show that she has hyponatremia. CT interpreted by myself without obvious obstruction, radiology read reviewed. D/w TRH for admission.    Final Clinical Impression(s) / ED Diagnoses Final diagnoses:  Constipation, unspecified constipation type  Hyponatremia    Rx / DC Orders ED Discharge Orders     None         Sharin Altidor, Corene Cornea, MD 01/27/22 2246

## 2022-01-27 NOTE — Plan of Care (Signed)

## 2022-01-27 NOTE — Progress Notes (Signed)
  Carryover admission to the Day Admitter.  I discussed this case with the EDP, Dr. Dayna Barker.  Per these discussions:  This is a 76 year old female with history of chronic hyponatremia associated baseline serum sodium range of 1 30-1 35, who is being admitted for acute on chronic hyponatremia after presenting with complaint of constipation.  Denies recent constipation, although continues to pass flatus.  Imaging of the abdomen demonstrated no evidence of bowel obstruction.  She reportedly was also experiencing recent nausea resulting in at least 1 episode of nonbloody, nonbilious emesis with associated decline in oral intake due to this intermittent nausea.  She has a history of essential hypertension for which she is on HCTZ.  She also reportedly closely adheres to a low-sodium diet at home.   Presenting labs notable for sodium 115 as well as acute kidney injury.  Mental status reportedly at baseline.  No reported recent trauma.  No evidence of seizures.  I have placed an order for inpatient admission for further evaluation and management of acute on chronic hyponatremia as well as acute kidney injury.   I have placed some additional preliminary admit orders via the adult multi-morbid admission order set. I have also ordered urinalysis with microscopy, random urine sodium, random urine creatinine.  Urine osmolality, serum osmolality, TSH.  Also ordered continuous normal saline at 75 cc/h x 12 hours, with repeat CMP at 8 AM as well as repeat BMP at noon to closely monitor interval trend in serum sodium level, particularly in response to aforementioned IV fluids.    Babs Bertin, DO Hospitalist

## 2022-01-27 NOTE — Evaluation (Signed)
Occupational Therapy Evaluation Patient Details Name: Samantha [name] MRN: [id number] DOB: May 23, 1946 Today's Date: 01/27/2022   History of Present Illness Samantha [name] is a 76 year old female who is being admitted for acute on chronic hyponatremia after presenting with complaint of constipation. Pt with history of chronic hyponatremia associated baseline serum sodium range of 1 30-1 35,   Clinical Impression   [name] was evaluated s/p the above admission list, she is generally indep at baseline including driving and shopping at [place]. She lives with her daughter who is a Education officer, museum. Upon evaluation pt required supervision for all transfers, mobility and ADLs. She will benefit from continued OT acutely to further assess higher level cognitive functioning and activity tolerance. Anticipate no OT follow up needs at d/c.      Recommendations for follow up therapy are one component of a multi-disciplinary discharge planning process, led by the attending physician.  Recommendations may be updated based on patient status, additional functional criteria and insurance authorization.   Follow Up Recommendations  No OT follow up    Assistance Recommended at Discharge Intermittent Supervision/Assistance  Patient can return home with the following Direct supervision/assist for medications management;Direct supervision/assist for financial management    Functional Status Assessment  Patient has had a recent decline in their functional status and demonstrates the ability to make significant improvements in function in a reasonable and predictable amount of time.  Equipment Recommendations  Tub/shower seat       Precautions / Restrictions Precautions Precautions: Fall Restrictions Weight Bearing Restrictions: No      Mobility Bed Mobility Overal bed mobility: Needs Assistance             General bed mobility comments: pt sitting on EOB eating upon arrival    Transfers Overall  transfer level: Needs assistance Equipment used: None Transfers: Sit to/from Stand Sit to Stand: Supervision                  Balance Overall balance assessment: No apparent balance deficits (not formally assessed)                                         ADL either performed or assessed with clinical judgement   ADL Overall ADL's : Needs assistance/impaired                                       General ADL Comments: supervision for all BADLs this date, no AD, no LOB, she will benefit from further evaluation of activity tolerance and higher level IADLs     Vision Baseline Vision/History: 0 No visual deficits Ability to See in Adequate Light: 0 Adequate Patient Visual Report: No change from baseline Vision Assessment?: No apparent visual deficits     Perception     Praxis      Pertinent Vitals/Pain Pain Assessment Pain Assessment: No/denies pain     Hand Dominance Right   Extremity/Trunk Assessment Upper Extremity Assessment Upper Extremity Assessment: Generalized weakness   Lower Extremity Assessment Lower Extremity Assessment: Generalized weakness   Cervical / Trunk Assessment Cervical / Trunk Assessment: Normal   Communication Communication Communication: No difficulties   Cognition Arousal/Alertness: Awake/alert Behavior During Therapy: WFL for tasks assessed/performed Overall Cognitive Status: No family/caregiver present to determine baseline cognitive functioning  General Comments: WFL for tasks assessed this session - will benefit from further higher level cog assessment     General Comments  VSS on RA    Exercises     Shoulder Instructions      Home Living Family/patient expects to be discharged to:: Private residence Living Arrangements: Children Available Help at Discharge: Family;Available PRN/intermittently Type of Home: Apartment Home Access: Stairs to  enter Entrance Stairs-Number of Steps: 4 Entrance Stairs-Rails: Right Home Layout: One level     Bathroom Shower/Tub: Teacher, early years/pre: Standard     Home Equipment: Conservation officer, nature (2 wheels)   Additional Comments: lives with daughter who works as a IT consultant Prior Level of Function : Independent/Modified Independent;Driving             Mobility Comments: no AD ADLs Comments: indep including driving and going to [employer]        OT Problem List: Decreased cognition;Decreased safety awareness;Decreased activity tolerance      OT Treatment/Interventions: Self-care/ADL training;Therapeutic exercise;DME and/or AE instruction;Therapeutic activities;Cognitive remediation/compensation;Balance training;Patient/family education    OT Goals(Current goals can be found in the care plan section) Acute Rehab OT Goals Patient Stated Goal: to have a BM OT Goal Formulation: With patient Time For Goal Achievement: 02/10/22 Potential to Achieve Goals: Good ADL Goals Pt Will Perform Grooming: Independently;standing Pt Will Perform Lower Body Dressing: Independently;sit to/from stand Pt Will Transfer to Toilet: Independently;ambulating;regular height toilet Additional ADL Goal #1: Pt will indep complete IADL medication management task accurately  OT Frequency: Min 2X/week    Co-evaluation              AM-PAC OT "6 Clicks" Daily Activity     Outcome Measure Help from another person eating meals?: None Help from another person taking care of personal grooming?: A Little Help from another person toileting, which includes using toliet, bedpan, or urinal?: A Little Help from another person bathing (including washing, rinsing, drying)?: A Little Help from another person to put on and taking off regular upper body clothing?: A Little Help from another person to put on and taking off regular lower body clothing?: A Little 6 Click  Score: 19   End of Session Nurse Communication: Mobility status  Activity Tolerance: Patient tolerated treatment well Patient left: in chair;with call bell/phone within reach;with chair alarm set  OT Visit Diagnosis: Other abnormalities of gait and mobility (R26.89);Muscle weakness (generalized) (M62.81)                Time: 7939-0300 OT Time Calculation (min): 20 min Charges:  OT General Charges $OT Visit: 1 Visit OT Evaluation $OT Eval Low Complexity: 1 Low   Renae Mottley A Mesiah Manzo 01/27/2022, 4:15 PM

## 2022-01-27 NOTE — ED Notes (Addendum)
Patient provided bedside commode.

## 2022-01-27 NOTE — H&P (Signed)
History and Physical    Patient: Samantha [name] XTK:[id number] DOB: 06/13/1946 DOA: 01/26/2022 DOS: the patient was seen and examined on 01/27/2022 PCP: Vonna Drafts, FNP  Patient coming from: Home - lives with daughter; NOK: Daughter, Katlen Seyer, [phone]   Chief Complaint: Constipation  HPI: Samantha [name] is a 76 y.o. female with medical history significant of constipation; DM; HTN; HLD; and chronic hyponatremia (baseline 130-135) presenting with constipation.   She reports that she was feeling unwell and was constipated.  She has been constipated since last week.  She has been taking Miralax and it isn't helping.  She just started it Friday.  She has not been confused.  She hasn't been eating as well as unusual due to nausea associated with constipation.    ER Course:  Carryover, per Dr. Velia Meyer:  Recent constipation, although continues to pass flatus.  AXR with no evidence of bowel obstruction.  +nausea with at least 1 episode of nonbloody, nonbilious emesis with associated decline in oral intake due to this intermittent nausea.  Presenting labs notable for sodium 115 as well as acute kidney injury.  Mental status reportedly at baseline.  No reported recent trauma.  No evidence of seizures.     Review of Systems: As mentioned in the history of present illness. All other systems reviewed and are negative. 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 She has never used smokeless tobacco. She reports that she does not drink alcohol and does not use drugs.  Allergies  Allergen Reactions   Lipitor [Atorvastatin]     Infection on leg caused by Lipitor, takes Crestor at home   Lisinopril Other (See Comments)    unknown   Pioglitazone     Other reaction(s): Unknown    Family History  Problem Relation Age of Onset   Anesthesia problems Neg Hx    Hypotension Neg Hx    Malignant hyperthermia Neg Hx    Pseudochol deficiency Neg Hx    Diabetes Other     Prior to Admission medications   Medication Sig Start Date End Date Taking? Authorizing Provider  aspirin 81 MG tablet Take 81 mg by mouth daily.     Yes [provider]  cholecalciferol (VITAMIN D) 1000 UNITS tablet Take 1,000 Units by mouth daily.   Yes [provider]  FARXIGA 5 MG TABS tablet Take 10 mg by mouth  daily. 10/09/21  Yes [provider]  irbesartan-hydrochlorothiazide (AVALIDE) 150-12.5 MG tablet Take 1 tablet by mouth daily.   Yes [provider]  loratadine (CLARITIN) 10 MG tablet Take 10 mg by mouth daily.     Yes [provider]  meloxicam (MOBIC) 7.5 MG tablet Take 7.5 mg by mouth daily. 04/26/18  Yes [provider]  polyethylene glycol (MIRALAX / GLYCOLAX) 17 g packet Take 17 g by mouth See admin instructions. Monday, Wednesday, Friday   Yes [provider]  rosuvastatin (CRESTOR) 5 MG tablet Take 5 mg by mouth daily. 04/25/16  Yes [provider]  ACCU-CHEK AVIVA PLUS test strip   11/03/13   [provider]  ACCU-CHEK FASTCLIX LANCETS [place]  10/29/13   [provider]  JARDIANCE 10 MG TABS tablet Take 10 mg by mouth daily. Patient not taking: Reported on 01/27/2022 10/09/21   [provider]  LORATADINE PO     [provider]  metFORMIN (GLUCOPHAGE) 1000 MG tablet Take 1,000 mg by mouth 2 (two) times daily with a meal.   Patient not taking: Reported on 01/27/2022    [provider]  TRAVATAN Z 0.004 % SOLN ophthalmic solution Place 1 drop into both eyes.  Patient not taking: Reported on 01/27/2022 10/08/15   [provider]  triamcinolone cream (KENALOG) 0.1 % Apply 1 application topically 2 (two) times daily as needed. For irritation Patient not taking: Reported on 01/27/2022    [provider]  valsartan-hydrochlorothiazide (DIOVAN-HCT) 160-25 MG tablet Take 1 tablet by mouth daily. Patient not taking: Reported on 01/27/2022 10/08/15   [provider]    Physical Exam: Vitals:   01/27/22 0321 01/27/22 0400 01/27/22 0500 01/27/22 0745  BP:  117/61 (!) 124/50 (!) 103/48  Pulse: 94 96 92 87  Resp: '13 17 16 13  '$ Temp:      TempSrc:      SpO2: 99% 98% 99% 100%   General:  Appears calm and comfortable and is in NAD Eyes:  PERRL, EOMI, normal lids, iris ENT:  grossly normal hearing, lips & tongue, mmm; edentulous without significant mouth movements Neck:  no LAD, masses or thyromegaly Cardiovascular:  RRR, no m/r/g. No LE edema.  Respiratory:   CTA bilaterally with no wheezes/rales/rhonchi.  Normal respiratory effort. Abdomen:  soft, NT, ND Skin:  no rash or induration seen on limited exam Musculoskeletal:  grossly normal tone BUE/BLE, good ROM, no bony abnormality Psychiatric:  grossly normal mood and affect, speech fluent and appropriate, AOx3 Neurologic:  CN 2-12 grossly intact, moves all extremities in coordinated fashion   Radiological Exams on Admission: Independently reviewed - see discussion  in A/P where applicable  CT ABDOMEN PELVIS W CONTRAST  Result Date: 01/27/2022 CLINICAL DATA:  Bowel obstruction suspected.  Constipation EXAM: CT ABDOMEN AND PELVIS WITH CONTRAST TECHNIQUE: Multidetector CT imaging of the abdomen and pelvis was performed using the standard protocol following bolus administration of intravenous contrast. RADIATION DOSE REDUCTION: This exam was performed according to the departmental dose-optimization program which includes automated exposure control, adjustment of the mA and/or kV according to patient size and/or use of iterative reconstruction technique. CONTRAST:  78m OMNIPAQUE IOHEXOL 300 MG/ML  SOLN COMPARISON:  11/14/2006 FINDINGS: Lower chest: No acute abnormality. Hepatobiliary: No focal liver abnormality is seen. No gallstones, gallbladder wall thickening, or biliary dilatation. Pancreas: Mildly atrophic but otherwise unremarkable Spleen: Unremarkable Adrenals/Urinary Tract: The adrenal glands are unremarkable. The kidneys are normal in size and position. Multiple simple cortical  cysts are seen within the kidneys bilaterally. No follow-up imaging is recommended for these lesions. No enhancing intrarenal masses are seen. No hydronephrosis. No intrarenal or ureteral calculi. The bladder is unremarkable. Stomach/Bowel: Stomach is within normal limits. Appendix appears normal. No evidence of bowel wall thickening, distention, or inflammatory changes. Small stool burden. Vascular/Lymphatic: Aortic atherosclerosis. No enlarged abdominal or pelvic lymph nodes. Reproductive: Uterus and bilateral adnexa are unremarkable. Other: Small fat containing umbilical hernia Musculoskeletal: No acute bone abnormality. No lytic or blastic bone lesions. IMPRESSION: No acute intra-abdominal pathology identified. No definite radiographic explanation for the patient's reported symptoms. Small stool burden. Aortic Atherosclerosis (ICD10-I70.0). Electronically Signed   By: Fidela Salisbury M.D.    On: 01/27/2022 02:37    EKG: Independently reviewed.  NSR with rate 89; no evidence of acute ischemia   Labs on Admission: I have personally reviewed the available labs and imaging studies at the time of the admission.  Pertinent labs:    Na++ 115, 122, 123 Glucose 175 BUN 27/Creatinine 1.41/GFR 39 -> 22/1.14/51 Hgb 11.4   Assessment and Plan: Principal Problem:   Acute hyponatremia    Hyponatremia -Etiology appears to be hypovolemic hyponatremia; patient with recent constipation and resultant decreased PO intake -Because the patient is generally asymptomatic, it is unlikely that this extent of hyponatremia developed overnight -Sodium increased from 115 to 122 based on boluses/IVF received in ER.   -In an effort to prevent overcorrection (>54mq/L/day so as to avoid central pontine myelinolysis, IVF was discontinued after completion -Repeat Na++ was still only 122 so will add LR at 50 cc/hr for now and recheck in AM (no longer in dangerous level)  AKI -Uncertain baseline creatinine -However patient was assumed to be dry on presentation and creatinine has improved with rehydration -Will recheck BMP in AM -Hold Mobic, consider d/c  Constipation -CT negative for pathology, showed only small stool burden -Will give PO dulcolax x 1 and request bowel care per nursing -Will also give Colace BID and prn Miralax  DM -hold Farxiga -Cover with moderate-scale SSI   HTN -Hold irbesartan-HCTZ  HLD -Continue Crestor    Advance Care Planning:   Code Status: Full Code   Consults: PT/OT; nutrition  DVT Prophylaxis: Lovenox  Family Communication: None present; she is capable of communicating with her family at this time  Severity of Illness: The appropriate patient status for this patient is OBSERVATION. Observation status is judged to be reasonable and necessary in order to provide the required intensity of service to ensure the patient's safety. The patient's presenting  symptoms, physical exam findings, and initial radiographic and laboratory data in the context of their medical condition is felt to place them at decreased risk for further clinical deterioration. Furthermore, it is anticipated that the patient will be medically stable for discharge from the hospital within 2 midnights of admission.   Author: JKarmen Bongo MD 01/27/2022 8:10 AM  For on call review [email or web address]

## 2022-01-28 DIAGNOSIS — K5900 Constipation, unspecified: Secondary | ICD-10-CM | POA: Diagnosis not present

## 2022-01-28 DIAGNOSIS — D649 Anemia, unspecified: Secondary | ICD-10-CM

## 2022-01-28 DIAGNOSIS — E871 Hypo-osmolality and hyponatremia: Secondary | ICD-10-CM | POA: Diagnosis not present

## 2022-01-28 DIAGNOSIS — I10 Essential (primary) hypertension: Secondary | ICD-10-CM

## 2022-01-28 DIAGNOSIS — N179 Acute kidney failure, unspecified: Secondary | ICD-10-CM

## 2022-01-28 LAB — CBC WITH DIFFERENTIAL/PLATELET
Abs Immature Granulocytes: 0.01 10*3/uL (ref 0.00–0.07)
Basophils Absolute: 0 10*3/uL (ref 0.0–0.1)
Basophils Relative: 1 %
Eosinophils Absolute: 0.3 10*3/uL (ref 0.0–0.5)
Eosinophils Relative: 4 %
HCT: 28.9 % — ABNORMAL LOW (ref 36.0–46.0)
Hemoglobin: 9.7 g/dL — ABNORMAL LOW (ref 12.0–15.0)
Immature Granulocytes: 0 %
Lymphocytes Relative: 36 %
Lymphs Abs: 2.7 10*3/uL (ref 0.7–4.0)
MCH: 29 pg (ref 26.0–34.0)
MCHC: 33.6 g/dL (ref 30.0–36.0)
MCV: 86.3 fL (ref 80.0–100.0)
Monocytes Absolute: 0.8 10*3/uL (ref 0.1–1.0)
Monocytes Relative: 10 %
Neutro Abs: 3.7 10*3/uL (ref 1.7–7.7)
Neutrophils Relative %: 49 %
Platelets: 298 10*3/uL (ref 150–400)
RBC: 3.35 MIL/uL — ABNORMAL LOW (ref 3.87–5.11)
RDW: 12.9 % (ref 11.5–15.5)
WBC: 7.5 10*3/uL (ref 4.0–10.5)
nRBC: 0 % (ref 0.0–0.2)

## 2022-01-28 LAB — BASIC METABOLIC PANEL
Anion gap: 9 (ref 5–15)
BUN: 33 mg/dL — ABNORMAL HIGH (ref 8–23)
CO2: 28 mmol/L (ref 22–32)
Calcium: 9.1 mg/dL (ref 8.9–10.3)
Chloride: 89 mmol/L — ABNORMAL LOW (ref 98–111)
Creatinine, Ser: 1.14 mg/dL — ABNORMAL HIGH (ref 0.44–1.00)
GFR, Estimated: 51 mL/min — ABNORMAL LOW (ref >60)
Glucose, Bld: 106 mg/dL — ABNORMAL HIGH (ref 70–99)
Potassium: 3.9 mmol/L (ref 3.5–5.1)
Sodium: 126 mmol/L — ABNORMAL LOW (ref 135–145)

## 2022-01-28 LAB — GLUCOSE, CAPILLARY
Glucose-Capillary: 107 mg/dL — ABNORMAL HIGH (ref 70–99)
Glucose-Capillary: 154 mg/dL — ABNORMAL HIGH (ref 70–99)
Glucose-Capillary: 197 mg/dL — ABNORMAL HIGH (ref 70–99)
Glucose-Capillary: 212 mg/dL — ABNORMAL HIGH (ref 70–99)
Glucose-Capillary: 292 mg/dL — ABNORMAL HIGH (ref 70–99)
Glucose-Capillary: 94 mg/dL (ref 70–99)

## 2022-01-28 NOTE — Progress Notes (Signed)
PROGRESS NOTE    Samantha [name]  QZR:[id number] DOB: [birth year] DOA: 01/26/2022 PCP: Vonna Drafts, FNP   Brief Narrative:  The Patient is a 76 year old African-American female with past medical history significant for but not limited to constipation, diabetes mellitus type 2, hypertension, hyperlipidemia, chronic hyponatremia with a baseline sodium of 130-135, history of palpitations and thymoma as well as arthritis and other comorbidities who presents with reports of feeling unwell and having some constipation.  She states she has been constipated since last week and has been taking MiraLAX and it has not been helping.  She states it started last Friday and has now been confused.  She has been eating as well due to nausea associated with her constipation.  She continues to pass flatus and abdominal x-ray was done which showed no evidence of bowel obstruction.  She did vomit once as nonbloody and nonbilious with associated decline in her oral intake due to the nausea.  She is also been drinking quite a bit of water and drinks at least 6 to 8 12 to 16 ounce water drinks earlier today.  She denies any recent trauma no evidence of seizures.  She is admitted for constipation and hyponatremia and is improving.  Given bowel regimen and had a very large bowel movement.  Sodium is trending up and PT OT recommending no follow-up currently.  We will also discontinue her hydrochlorothiazide given her hyponatremia.  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  Now sodium is further improved and is now 126  at the time of check this morning -Osmolality was 260 -TSH was 1.615 -In an effort to prevent overcorrection (>41mq/L/day so as to avoid central pontine myelinolysis, IVF was discontinued after completion -Given repeat Na+ was still only 122 she was initiated on LR at 50 cc/hr for now and recheck in AM (no longer in dangerous level) -Currently allow for no salt restriction given the amount of water consumes -Continue to monitor and trend and repeat CMP in the a.m.   AKI -Uncertain baseline creatinine however upon further review back in 2013 her creatinine ranged from 0.7-1.0 -However patient was assumed to be dry on presentation and creatinine has improved with rehydration -Patient's BUNs/creatinine has gone from 27/1.41 to 22/1.20 and is now 33/1.14 this morning -Avoid further nephrotoxic medications, contrast dyes, hypotension and dehydration to ensure adequate renal perfusion and renally adjust medications -Repeat CMP in a.m. -Continue to hold Mobic and will likely discontinue at discharge   Constipation -CT negative for pathology, showed only small stool burden -Will give PO dulcolax x 1 and request bowel care per nursing -Will also continue docusate sodium 100 mg p.o. twice daily and MiraLAX 17 g p.o. daily as needed -Continue with open ambulation and continuing LR at 50 mils per hour for now -PT/OT recommending Home Health    DM Type 2 -Hold oral medications including Farxiga -Continue with moderate NovoLog sliding scale insulin before meals and at bedtime -CBGs ranging from 107-275   HTN -Hold Irbesartan-HCTZ -Continue to monitor blood pressures per protocol -Last blood pressure reading was on the softer side at 82/43  Normocytic Anemia -Patient's Hemoglobin/Hematocrit  went from 11.4/33.2 -> 11.4/32.6 -> 9.7/28.9   HLD -Continue Rosuvastatin 5 mg po Daily   Obesity -Complicates overall prognosis and care -Estimated body mass index is 37.42 kg/m as calculated from the  following:   Height as of this encounter: 5' 2.5" (1.588 m).   Weight as of this encounter: 94.3 kg.  -Weight Loss and Dietary Counseling given  DVT prophylaxis: enoxaparin (LOVENOX) injection 40 mg Start: 01/27/22 2200    Code Status: Full Code Family Communication: Discussed with Sister at bedside   Disposition Plan:  Level of care: Telemetry Medical Status is: Inpatient Remains inpatient appropriate because: We will need to ensure her sodium is improved and will continue to monitor and continue bowel regimen for now.  Anticipating discharging home in next 24 to 48 hours   Consultants:  None  Procedures:  None  Antimicrobials:  Anti-infectives (From admission, onward)    None       Subjective: Seen and examined at bedside and she is sitting in a chair and felt well.  Ambulated well.  States she is "85% better".  No nausea or vomiting.  Had a large bowel movement this morning.  Denies any lightheadedness or dizziness.  No other concerns or close at this time  Objective: Vitals:   01/27/22 1700 01/27/22 2026 01/28/22 0434 01/28/22 0828  BP:  (!) 110/54 (!) 101/53 (!) 82/43  Pulse:  93 89 77  Resp:  16  18  Temp:  97.9 F (36.6 C) 98.3 F (36.8 C) 98 F (36.7 C)  TempSrc:  Oral Oral   SpO2:  100% 97% 99%  Weight: 94.3 kg     Height: 5' 2.5" (1.588 m)       Intake/Output Summary (Last 24 hours) at 01/28/2022 1643 Last data filed at 01/28/2022 1301 Gross per 24 hour  Intake 1758.52 ml  Output --  Net 1758.52 ml   Filed Weights   01/27/22 1700  Weight: 94.3 kg   Examination: Physical Exam:  Constitutional: WN/WD obese African-American female currently no acute distress appears calm sitting in the chair bedside Respiratory: Slightly diminished to auscultation bilaterally with coarse breath sounds, no wheezing, rales, rhonchi or crackles. Normal respiratory effort and patient is not tachypenic. No accessory muscle use.  Unlabored breathing Cardiovascular: RRR,  no murmurs / rubs / gallops. S1 and S2 auscultated.  Abdomen: Soft, non-tender, distended secondary body habitus.  Bowel sounds positive.  GU: Deferred. Musculoskeletal: No clubbing / cyanosis of digits/nails. No joint deformity upper and lower extremities.  Neurologic: CN 2-12 grossly intact with no focal deficits. Romberg sign and cerebellar reflexes not assessed.  Psychiatric: Normal judgment and insight. Alert and oriented x 3. Normal mood and appropriate affect.   Data Reviewed: I have personally reviewed following labs and imaging studies  CBC: Recent Labs  Lab 01/26/22 2249 01/27/22 0810 01/28/22 0122  WBC 8.9 7.4 7.5  NEUTROABS 5.8 4.4 3.7  HGB 11.4* 11.4* 9.7*  HCT 33.2* 32.6* 28.9*  MCV 84.5 84.5 86.3  PLT 326 294 300   Basic Metabolic Panel: Recent Labs  Lab 01/26/22 2249 01/27/22 0810 01/27/22 1317 01/28/22 0122  NA 115* 122* 123* 126*  K 3.6 3.9 3.7 3.9  CL 76* 83* 86* 89*  CO2 '22 26 26 28  '$ GLUCOSE 175* 131* 147* 106*  BUN 27* 23 22 33*  CREATININE 1.41* 1.20* 1.14* 1.14*  CALCIUM 9.3 9.6 9.5 9.1  MG  --  1.7  --   --    GFR: Estimated  Creatinine Clearance: 46.8 mL/min (A) (by C-G formula based on SCr of 1.14 mg/dL (H)). Liver Function Tests: Recent Labs  Lab 01/27/22 0810  AST 40  ALT 21  ALKPHOS 53  BILITOT 1.2  PROT 7.0  ALBUMIN 3.5   No results for input(s): LIPASE, AMYLASE in the last 168 hours. No results for input(s): AMMONIA in the last 168 hours. Coagulation Profile: No results for input(s): INR, PROTIME in the last 168 hours. Cardiac Enzymes: No results for input(s): CKTOTAL, CKMB, CKMBINDEX, TROPONINI in the last 168 hours. BNP (last 3 results) No results for input(s): PROBNP in the last 8760 hours. HbA1C: No results for input(s): HGBA1C in the last 72 hours. CBG: Recent Labs  Lab 01/27/22 1733 01/27/22 2129 01/28/22 0826 01/28/22 1159 01/28/22 1505  GLUCAP 275* 161* 107* 154* 197*   Lipid Profile: No results for  input(s): CHOL, HDL, LDLCALC, TRIG, CHOLHDL, LDLDIRECT in the last 72 hours. Thyroid Function Tests: Recent Labs    01/27/22 0810  TSH 1.615   Anemia Panel: No results for input(s): VITAMINB12, FOLATE, FERRITIN, TIBC, IRON, RETICCTPCT in the last 72 hours. Sepsis Labs: No results for input(s): PROCALCITON, LATICACIDVEN in the last 168 hours.  No results found for this or any previous visit (from the past 240 hour(s)).   Radiology Studies: CT ABDOMEN PELVIS W CONTRAST  Result Date: 01/27/2022 CLINICAL DATA:  Bowel obstruction suspected.  Constipation EXAM: CT ABDOMEN AND PELVIS WITH CONTRAST TECHNIQUE: Multidetector CT imaging of the abdomen and pelvis was performed using the standard protocol following bolus administration of intravenous contrast. RADIATION DOSE REDUCTION: This exam was performed according to the departmental dose-optimization program which includes automated exposure control, adjustment of the mA and/or kV according to patient size and/or use of iterative reconstruction technique. CONTRAST:  57m OMNIPAQUE IOHEXOL 300 MG/ML  SOLN COMPARISON:  11/14/2006 FINDINGS: Lower chest: No acute abnormality. Hepatobiliary: No focal liver abnormality is seen. No gallstones, gallbladder wall thickening, or biliary dilatation. Pancreas: Mildly atrophic but otherwise unremarkable Spleen: Unremarkable Adrenals/Urinary Tract: The adrenal glands are unremarkable. The kidneys are normal in size and position. Multiple simple cortical cysts are seen within the kidneys bilaterally. No follow-up imaging is recommended for these lesions. No enhancing intrarenal masses are seen. No hydronephrosis. No intrarenal or ureteral calculi. The bladder is unremarkable. Stomach/Bowel: Stomach is within normal limits. Appendix appears normal. No evidence of bowel wall thickening, distention, or inflammatory changes. Small stool burden. Vascular/Lymphatic: Aortic atherosclerosis. No enlarged abdominal or pelvic lymph  nodes. Reproductive: Uterus and bilateral adnexa are unremarkable. Other: Small fat containing umbilical hernia Musculoskeletal: No acute bone abnormality. No lytic or blastic bone lesions. IMPRESSION: No acute intra-abdominal pathology identified. No definite radiographic explanation for the patient's reported symptoms. Small stool burden. Aortic Atherosclerosis (ICD10-I70.0). Electronically Signed   By: AFidela SalisburyM.D.   On: 01/27/2022 02:37    Scheduled Meds:  aspirin  81 mg Oral Daily   docusate sodium  100 mg Oral BID   enoxaparin (LOVENOX) injection  40 mg Subcutaneous Q24H   insulin aspart  0-15 Units Subcutaneous TID WC   insulin aspart  0-5 Units Subcutaneous QHS   loratadine  10 mg Oral Daily   rosuvastatin  5 mg Oral Daily   sodium chloride flush  3 mL Intravenous Q12H   Continuous Infusions:  lactated ringers Stopped (01/28/22 1250)    LOS: 1 day   ORaiford Noble DO [facility] Available via Epic secure chat 7am-7pm After these hours, please refer to coverage provider listed  on [email or web address] 01/28/2022, 4:43 PM

## 2022-01-28 NOTE — Evaluation (Signed)
Physical Therapy Evaluation Patient Details Name: Samantha [name] MRN: [id number] DOB: 16-Mar-1946 Today's Date: 01/28/2022  History of Present Illness  Samantha [name] is a 76 year old female who is being admitted for acute on chronic hyponatremia after presenting with complaint of constipation. Pt with history of chronic hyponatremia associated baseline serum sodium range of 1 30-135, DM, and HTN.  Clinical Impression  Prior to admission, pt was independent with all ADLs/IADLs and driving. Pt was independent with transfers, able to ambulate 563f and did 3 stairs with supervision. Pt encouraged to continue to mobilize while inpatient. Pt does not require acute PT as she is near her reported baseline for mobility and will not require follow up PT upon discharge.      Recommendations for follow up therapy are one component of a multi-disciplinary discharge planning process, led by the attending physician.  Recommendations may be updated based on patient status, additional functional criteria and insurance authorization.  Follow Up Recommendations No PT follow up    Assistance Recommended at Discharge Set up Supervision/Assistance  Patient can return home with the following  Assistance with cooking/housework;Assist for transportation;Help with stairs or ramp for entrance    Equipment Recommendations None recommended by PT  Recommendations for Other Services       Functional Status Assessment Patient has had a recent decline in their functional status and demonstrates the ability to make significant improvements in function in a reasonable and predictable amount of time.     Precautions / Restrictions Precautions Precautions: Fall Restrictions Weight Bearing Restrictions: No      Mobility  Bed Mobility               General bed mobility comments: Pt in chair upon arrival    Transfers Overall transfer level: Independent Equipment used: None                     Ambulation/Gait Ambulation/Gait assistance: Supervision Gait Distance (Feet): 500 Feet Assistive device: None Gait Pattern/deviations: Shuffle          Stairs Stairs: Yes Stairs assistance: Supervision Stair Management: One rail Left Number of Stairs: 3    Wheelchair Mobility    Modified Rankin (Stroke Patients Only)       Balance Overall balance assessment: Mild deficits observed, not formally tested                                           Pertinent Vitals/Pain Pain Assessment Pain Assessment: No/denies pain    Home Living Family/patient expects to be discharged to:: Private residence Living Arrangements: Children Available Help at Discharge: Family;Available PRN/intermittently Type of Home: Apartment Home Access: Stairs to enter Entrance Stairs-Rails: Left Entrance Stairs-Number of Steps: 4   Home Layout: One level Home Equipment: RConservation officer, nature(2 wheels)      Prior Function Prior Level of Function : Independent/Modified Independent;Driving                     Hand Dominance   Dominant Hand: Right    Extremity/Trunk Assessment   Upper Extremity Assessment Upper Extremity Assessment: Overall WFL for tasks assessed    Lower Extremity Assessment Lower Extremity Assessment: Overall WFL for tasks assessed    Cervical / Trunk Assessment Cervical / Trunk Assessment: Kyphotic  Communication   Communication: No difficulties  Cognition Arousal/Alertness: Awake/alert Behavior During Therapy: WFL for tasks assessed/performed Overall  Cognitive Status: Within Functional Limits for tasks assessed                                          General Comments      Exercises     Assessment/Plan    PT Assessment Patient does not need any further PT services  PT Problem List         PT Treatment Interventions      PT Goals (Current goals can be found in the Care Plan section)  Acute Rehab PT  Goals Patient Stated Goal: to return home PT Goal Formulation: All assessment and education complete, DC therapy    Frequency       Co-evaluation               AM-PAC PT "6 Clicks" Mobility  Outcome Measure Help needed turning from your back to your side while in a flat bed without using bedrails?: None Help needed moving from lying on your back to sitting on the side of a flat bed without using bedrails?: None Help needed moving to and from a bed to a chair (including a wheelchair)?: None Help needed standing up from a chair using your arms (e.g., wheelchair or bedside chair)?: None Help needed to walk in hospital room?: A Little Help needed climbing 3-5 steps with a railing? : A Little 6 Click Score: 22    End of Session   Activity Tolerance: Patient tolerated treatment well Patient left: in chair;with nursing/sitter in room;with chair alarm set;with call bell/phone within reach Nurse Communication: Mobility status PT Visit Diagnosis: Difficulty in walking, not elsewhere classified (R26.2)    Time: 7628-3151 PT Time Calculation (min) (ACUTE ONLY): 28 min   Charges:   PT Evaluation $PT Eval Low Complexity: 1 Low PT Treatments $Therapeutic Activity: 8-22 mins   Mackie Pai, SPT Acute Rehabilitation Services       Mackie Pai 01/28/2022, 3:26 PM

## 2022-01-28 NOTE — Progress Notes (Signed)
Patient is out of bed in chair eating dinner.  Requests return later for IV start.  Bedside nurse agreeable with this plan.

## 2022-01-29 DIAGNOSIS — N179 Acute kidney failure, unspecified: Secondary | ICD-10-CM | POA: Diagnosis not present

## 2022-01-29 DIAGNOSIS — E669 Obesity, unspecified: Secondary | ICD-10-CM

## 2022-01-29 DIAGNOSIS — E1169 Type 2 diabetes mellitus with other specified complication: Secondary | ICD-10-CM | POA: Diagnosis not present

## 2022-01-29 DIAGNOSIS — I10 Essential (primary) hypertension: Secondary | ICD-10-CM | POA: Diagnosis not present

## 2022-01-29 DIAGNOSIS — E871 Hypo-osmolality and hyponatremia: Secondary | ICD-10-CM | POA: Diagnosis not present

## 2022-01-29 LAB — CBC WITH DIFFERENTIAL/PLATELET
Abs Immature Granulocytes: 0.02 10*3/uL (ref 0.00–0.07)
Basophils Absolute: 0 10*3/uL (ref 0.0–0.1)
Basophils Relative: 0 %
Eosinophils Absolute: 0.5 10*3/uL (ref 0.0–0.5)
Eosinophils Relative: 7 %
HCT: 33.8 % — ABNORMAL LOW (ref 36.0–46.0)
Hemoglobin: 11.2 g/dL — ABNORMAL LOW (ref 12.0–15.0)
Immature Granulocytes: 0 %
Lymphocytes Relative: 37 %
Lymphs Abs: 2.5 10*3/uL (ref 0.7–4.0)
MCH: 28.9 pg (ref 26.0–34.0)
MCHC: 33.1 g/dL (ref 30.0–36.0)
MCV: 87.1 fL (ref 80.0–100.0)
Monocytes Absolute: 0.7 10*3/uL (ref 0.1–1.0)
Monocytes Relative: 10 %
Neutro Abs: 3.1 10*3/uL (ref 1.7–7.7)
Neutrophils Relative %: 46 %
Platelets: 316 10*3/uL (ref 150–400)
RBC: 3.88 MIL/uL (ref 3.87–5.11)
RDW: 13.3 % (ref 11.5–15.5)
WBC: 6.7 10*3/uL (ref 4.0–10.5)
nRBC: 0 % (ref 0.0–0.2)

## 2022-01-29 LAB — COMPREHENSIVE METABOLIC PANEL
ALT: 23 U/L (ref 0–44)
AST: 38 U/L (ref 15–41)
Albumin: 3.3 g/dL — ABNORMAL LOW (ref 3.5–5.0)
Alkaline Phosphatase: 52 U/L (ref 38–126)
Anion gap: 10 (ref 5–15)
BUN: 37 mg/dL — ABNORMAL HIGH (ref 8–23)
CO2: 27 mmol/L (ref 22–32)
Calcium: 9.9 mg/dL (ref 8.9–10.3)
Chloride: 92 mmol/L — ABNORMAL LOW (ref 98–111)
Creatinine, Ser: 1.38 mg/dL — ABNORMAL HIGH (ref 0.44–1.00)
GFR, Estimated: 40 mL/min — ABNORMAL LOW (ref >60)
Glucose, Bld: 192 mg/dL — ABNORMAL HIGH (ref 70–99)
Potassium: 3.9 mmol/L (ref 3.5–5.1)
Sodium: 129 mmol/L — ABNORMAL LOW (ref 135–145)
Total Bilirubin: 0.5 mg/dL (ref 0.3–1.2)
Total Protein: 7.3 g/dL (ref 6.5–8.1)

## 2022-01-29 LAB — GLUCOSE, CAPILLARY
Glucose-Capillary: 101 mg/dL — ABNORMAL HIGH (ref 70–99)
Glucose-Capillary: 179 mg/dL — ABNORMAL HIGH (ref 70–99)

## 2022-01-29 LAB — PHOSPHORUS: Phosphorus: 3 mg/dL (ref 2.5–4.6)

## 2022-01-29 LAB — MAGNESIUM: Magnesium: 1.9 mg/dL (ref 1.7–2.4)

## 2022-01-29 MED ORDER — POLYETHYLENE GLYCOL 3350 17 G PO PACK: 17.0000 g | PACK | Freq: Every day | ORAL | 0 refills | Status: AC

## 2022-01-29 MED ORDER — ACETAMINOPHEN 325 MG PO TABS
650.0000 mg | ORAL_TABLET | Freq: Four times a day (QID) | ORAL | 0 refills | Status: AC | PRN
Start: 2022-01-29 — End: ?

## 2022-01-29 MED ORDER — DOCUSATE SODIUM 100 MG PO CAPS: 100.0000 mg | ORAL_CAPSULE | Freq: Two times a day (BID) | ORAL | 0 refills | Status: AC

## 2022-01-29 MED ORDER — POLYETHYLENE GLYCOL 3350 17 G PO PACK
17.0000 g | PACK | Freq: Every day | ORAL | Status: DC
Start: 2022-01-29 — End: 2022-01-29

## 2022-01-29 MED ORDER — IRBESARTAN 150 MG PO TABS: 150.0000 mg | ORAL_TABLET | Freq: Every day | ORAL | 0 refills | Status: AC

## 2022-01-29 NOTE — Progress Notes (Signed)
  Transition of Care [facility]) Screening Note   Patient Details  Name: Samantha [name] Date of Birth: 1945-10-30   Transition of Care [facility]) CM/SW Contact:    Bartholomew Crews, RN Phone Number: [phone] 01/29/2022, 8:33 AM  Patient admitted with chronic hyponatremia. Monitoring labs. PT and OT evaluations indicate no follow up needed.   Transition of Care Department [facility]) has reviewed patient and no TOC needs have been identified at this time. We will continue to monitor patient advancement through interdisciplinary progression rounds. If new patient transition needs arise, please place a TOC consult.

## 2022-01-29 NOTE — Progress Notes (Signed)
Occupational Therapy Treatment Patient Details Name: Samantha [name] MRN: [id number] DOB: March 03, 1946 Today's Date: 01/29/2022   History of present illness Samantha [name] is a 76 year old female who is being admitted for acute on chronic hyponatremia after presenting with complaint of constipation. Pt with history of chronic hyponatremia associated baseline serum sodium range of 1 30-135, DM, and HTN.   OT comments  Pt initially demonstrating unsteadiness with first OOB ADLs this AM though steadiness improved with progressive activity. Overall, pt min guard to supervision for bathroom mobility and Setup Assist at most for ADLs assessed this AM. Began discussing pt's med mgmt routine with plans to further assess higher level cognition in next sessions. Pt on track to DC home when medically stable.    Recommendations for follow up therapy are one component of a multi-disciplinary discharge planning process, led by the attending physician.  Recommendations may be updated based on patient status, additional functional criteria and insurance authorization.    Follow Up Recommendations  No OT follow up    Assistance Recommended at Discharge Set up Supervision/Assistance  Patient can return home with the following  Direct supervision/assist for medications management;Direct supervision/assist for financial management   Equipment Recommendations  Tub/shower seat    Recommendations for Other Services      Precautions / Restrictions Precautions Precautions: Fall Restrictions Weight Bearing Restrictions: No       Mobility Bed Mobility Overal bed mobility: Needs Assistance Bed Mobility: Supine to Sit     Supine to sit: Min assist, HOB elevated     General bed mobility comments: requesting handheld assist to lift trunk    Transfers Overall transfer level: Needs assistance Equipment used: None Transfers: Sit to/from Stand Sit to Stand: Supervision                 Balance Overall  balance assessment: Mild deficits observed, not formally tested                                         ADL either performed or assessed with clinical judgement   ADL Overall ADL's : Needs assistance/impaired     Grooming: Set up;Standing;Wash/dry hands           Upper Body Dressing : Modified independent;Standing Upper Body Dressing Details (indicate cue type and reason): able to doff bra in standing with assist only for lead mgmt     Toilet Transfer: Min guard;Ambulation Toilet Transfer Details (indicate cue type and reason): progressing to supervision, mildly unsteady with initial standing/mobility tasks Toileting- Clothing Manipulation and Hygiene: Set up;Sit to/from stand;Sitting/lateral lean Toileting - Clothing Manipulation Details (indicate cue type and reason): able to complete hygiene in standing without LOB or AD, required assist to access toilet paper from dispenser       General ADL Comments: Unsteady with initial mobility this AM but improved with progressed activity    Extremity/Trunk Assessment Upper Extremity Assessment Upper Extremity Assessment: Overall WFL for tasks assessed   Lower Extremity Assessment Lower Extremity Assessment: Defer to PT evaluation        Vision   Vision Assessment?: No apparent visual deficits   Perception     Praxis      Cognition Arousal/Alertness: Awake/alert Behavior During Therapy: WFL for tasks assessed/performed Overall Cognitive Status: Within Functional Limits for tasks assessed  General Comments: follows directions well, mild confusion noted when discussing plan to order breakfast and pt reported preference to use room phone. Given room phone at end of session with pt reporting she did not need that as she had already called in breakfast order with her cell phone but later reported unsure. Discussed med mgmt routine with pt reporting she uses a pill box  "every day", reports medications only 1x/day - will further assess        Exercises      Shoulder Instructions       General Comments HR 80s-90s    Pertinent Vitals/ Pain       Pain Assessment Pain Assessment: No/denies pain  Home Living                                          Prior Functioning/Environment              Frequency  Min 2X/week        Progress Toward Goals  OT Goals(current goals can now be found in the care plan section)  Progress towards OT goals: Progressing toward goals  Acute Rehab OT Goals Patient Stated Goal: eat some breakfast OT Goal Formulation: With patient Time For Goal Achievement: 02/10/22 Potential to Achieve Goals: Good ADL Goals Pt Will Perform Grooming: Independently;standing Pt Will Perform Lower Body Dressing: Independently;sit to/from stand Pt Will Transfer to Toilet: Independently;ambulating;regular height toilet Additional ADL Goal #1: Pt will indep complete IADL medication management task accurately  Plan Discharge plan remains appropriate    Co-evaluation                 AM-PAC OT "6 Clicks" Daily Activity     Outcome Measure   Help from another person eating meals?: None Help from another person taking care of personal grooming?: A Little Help from another person toileting, which includes using toliet, bedpan, or urinal?: A Little Help from another person bathing (including washing, rinsing, drying)?: A Little Help from another person to put on and taking off regular upper body clothing?: None Help from another person to put on and taking off regular lower body clothing?: A Little 6 Click Score: 20    End of Session Equipment Utilized During Treatment: Gait belt  OT Visit Diagnosis: Other abnormalities of gait and mobility (R26.89);Muscle weakness (generalized) (M62.81)   Activity Tolerance Patient tolerated treatment well   Patient Left in bed;with call bell/phone within  reach;Other (comment) (sitting EOB with NT accessing vitals)   Nurse Communication Mobility status        Time: 7681-1572 OT Time Calculation (min): 18 min  Charges: OT General Charges $OT Visit: 1 Visit OT Treatments $Self Care/Home Management : 8-22 mins  Malachy Chamber, OTR/L Acute Rehab Services Office: [phone]   Layla Maw 01/29/2022, 7:45 AM

## 2022-01-29 NOTE — Progress Notes (Signed)
Mobility Specialist Progress Note   01/29/22 1425  Mobility  Activity Ambulated independently in hallway  Level of Assistance Independent after set-up  Assistive Device None  Distance Ambulated (ft) 230 ft  Activity Response Tolerated well  $Mobility charge 1 Mobility   Received pt in chair eager to go home but having no complaints and agreeable to mobility. Asymptomatic throughout ambulation, returned back to chair awaiting to get changed for d/c. Holland Falling Mobility Specialist Phone Number [phone]

## 2022-01-29 NOTE — Progress Notes (Signed)
Initial Nutrition Assessment  DOCUMENTATION CODES:   Obesity unspecified  INTERVENTION:  Upon discharge home, recommend regular diet with thin liquids.   Diet education given.   NUTRITION DIAGNOSIS:   Increased nutrient needs related to acute illness as evidenced by estimated needs.  GOAL:   Patient will meet greater than or equal to 90% of their needs  MONITOR:   PO intake, Supplement acceptance, Labs, Weight trends, Skin, I & O's  REASON FOR ASSESSMENT:   Consult Assessment of nutrition requirement/status  ASSESSMENT:   76 y.o. female with medical history significant of constipation; DM; HTN; HLD; and chronic hyponatremia (baseline 130-135) presenting with constipation.  Meal completion has been 75%. Pt reports having a good appetite currently. Pt reports consuming 3 meals a day at home. Daughter at bedside. Diet education given and discussed. Recommended fresh fruits and vegetables as well as whole grains. Discussed lean protein at each meal and to limited sugar sweetened beverages. Pt reports consuming >/= 96 ounces of water a day. Recommended decreasing the amount of fluid intake to prevent hyponatremia. Daughter and pt report understanding of information discussed. Plans to discharge home today.   NUTRITION - FOCUSED PHYSICAL EXAM:  Flowsheet Row Most Recent Value  Orbital Region No depletion  Upper Arm Region No depletion  Thoracic and Lumbar Region No depletion  Buccal Region No depletion  Temple Region No depletion  Clavicle Bone Region No depletion  Clavicle and Acromion Bone Region No depletion  Scapular Bone Region Unable to assess  Dorsal Hand No depletion  Patellar Region No depletion  Anterior Thigh Region No depletion  Posterior Calf Region No depletion  Edema (RD Assessment) None  Hair Reviewed  Eyes Reviewed  Mouth Reviewed  Skin Reviewed  Nails Reviewed      Labs and medications reviewed.  Diet Order:   Diet Order             Diet  heart healthy/carb modified Room service appropriate? Yes; Fluid consistency: Thin  Diet effective now                   EDUCATION NEEDS:   Education needs have been addressed  Skin:  Skin Assessment: Reviewed RN Assessment  Last BM:  5/21  Height:   Ht Readings from Last 1 Encounters:  01/27/22 5' 2.5" (1.588 m)    Weight:   Wt Readings from Last 1 Encounters:  01/27/22 94.3 kg   BMI:  Body mass index is 37.42 kg/m.  Estimated Nutritional Needs:   Kcal:  1850-2050  Protein:  85-95 grams  Fluid:  >/= 1.8 L/day  Corrin Parker, MS, RD, LDN RD pager number/after hours weekend pager number on Amion.

## 2022-01-29 NOTE — TOC Transition Note (Signed)
Transition of Care [facility]) - CM/SW Discharge Note   Patient Details  Name: Samantha [name] MRN: [id number] Date of Birth: September 27, 1945  Transition [facility] Care [facility]) CM/SW Contact:  Bartholomew Crews, RN Phone Number: [phone] 01/29/2022, 3:32 PM   Clinical Narrative:     Patient to transition home today. No TOC needs identified at this time.   Final next level of care: Home/Self Care Barriers to Discharge: No Barriers Identified   Patient Goals and CMS Choice        Discharge Placement                       Discharge Plan and Services                                     Social Determinants of Health (SDOH) Interventions     Readmission Risk Interventions     View : No data to display.

## 2022-01-29 NOTE — Discharge Summary (Signed)
Physician Discharge Summary   Patient: Samantha [name] MRN: [id number] DOB: 01-Dec-1945  Admit date:     01/26/2022  Discharge date: 01/29/22  Discharge Physician: Raiford Noble, DO   PCP: Vonna Drafts, FNP   Recommendations at discharge:   Follow-up with PCP within 1 to 2 weeks and repeat CBC, CMP, mag, Phos within 1 week and continue bowel regimen at discharge Follow-up with nephrology within 1 to 2 weeks if necessary  Discharge Diagnoses: Principal Problem:   Acute hyponatremia Active Problems:   Diabetes mellitus type 2 in obese ([place])   HYPERTENSION, BENIGN ESSENTIAL   Hyponatremia   AKI (acute kidney injury) ([place])   Normocytic anemia   Constipation  Resolved Problems:   * No resolved hospital problems. [facility] Course: The Patient is a 76 year old African-American female with past medical history significant for but not limited to constipation, diabetes mellitus type 2, hypertension, hyperlipidemia, chronic hyponatremia with a baseline sodium of 130-135, history of palpitations and thymoma as well as arthritis and other comorbidities who presents with reports of feeling unwell and having some constipation.  She states she has been constipated since last week and has been taking MiraLAX and it has not been helping.  She states it started last Friday and has now been confused.  She has been eating as well due to nausea associated with her constipation.  She continues to pass flatus and abdominal x-ray was done which showed no evidence of bowel obstruction.  She did vomit once as nonbloody and nonbilious with associated decline in her oral intake due to the nausea.  She is also been drinking quite a bit of water and drinks at least 6 to 8 12 to 16 ounce water drinks earlier today.  She denies any recent trauma no evidence of seizures.  She is admitted for constipation and hyponatremia and is improving.  Given bowel regimen and had a very large bowel movement.  Sodium is  trending up and PT OT recommending no follow-up currently.  We will also discontinue her hydrochlorothiazide given her hyponatremia.  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  Now sodium is further improved and is now 126 at the time of check yesterday morning and now is 129 -Osmolality was 260 -TSH was 1.615 -In an effort to prevent overcorrection (>38mq/L/day so as to avoid central pontine myelinolysis, IVF was discontinued after completion -Given repeat Na+ was still only 122 she was initiated on LR at 50 cc/hr for now and recheck in AM (no longer in dangerous level) -Currently allow for no salt restriction given the amount of water consumes -Continue to monitor and trend and repeat CMP within 1 week and recommend no salt restriction in her diet but did recommend not drinking as much free water -Follow-up with PCP within 1 week and if necessary outpatient evaluation and nephrology   AKI on likely CKD stage IIIa -Uncertain baseline creatinine however upon further review back in 2013 her creatinine ranged from 0.7-1.0 -However patient was assumed to be dry on presentation and creatinine has improved with rehydration -Patient's BUNs/creatinine has gone from 27/1.41 to 22/1.20 and is  now 33/1.14 yesterday morning but is now being assessed creatinine is 37/1.38 -Avoid further nephrotoxic medications, contrast dyes, hypotension and dehydration to ensure adequate renal perfusion and renally adjust medications -Repeat CMP in a.m. -Continue to hold Mobic and will likely discontinue at  discharge -Follow-up with PCP within 1 week and follow-up with nephrology in outpatient setting if necessary   Constipation -CT negative for pathology, showed only small stool burden -Will give PO dulcolax x 1 and request bowel care per nursing -Will also continue docusate sodium 100 mg p.o. twice daily and MiraLAX 17 g p.o. daily as needed -Continue with open ambulation and continuing LR at 50 mils per hour for now -PT/OT recommending Home Health  -Continue with bowel regimen at discharge   DM Type 2 -Hold oral medications including Farxiga -Continue with moderate NovoLog sliding scale insulin before meals and at bedtime -CBGs ranging from 94-292   HTN -Hold Irbesartan-HCTZ and discontinue the hydrochlorothiazide component at discharge and discontinue irbesartan at discharge -Continue to monitor blood pressures per protocol -Last blood pressure reading was on the softer side at 108/49 but she is stable for discharge   Normocytic Anemia -Patient's Hemoglobin/Hematocrit went from 11.4/33.2 -> 11.4/32.6 -> 9.7/28.9 and today is now 11.2/33.8 -Continue to monitor for signs and symptoms bleeding; no overt bleeding noted -Repeat CBC within 1 week   HLD -Continue Rosuvastatin 5 mg po Daily   Obesity -Complicates overall prognosis and care -Estimated body mass index is 37.42 kg/m as calculated from the following:   Height as of this encounter: 5' 2.5" (1.588 m).   Weight as of this encounter: 94.3 kg.  -Weight Loss and Dietary Counseling given  Nutrition Documentation    [place] ED to Hosp-Admission (Discharged) from 01/26/2022 in [place]  Nutrition Problem Increased nutrient needs  Etiology acute illness  Nutrition Goal Patient will meet greater than or equal to 90% of their needs  Interventions Refer to RD note for recommendations      Pain control - [facility] Controlled Substance Reporting System database was reviewed. and  patient was instructed, not to drive, operate heavy machinery, perform activities at heights, swimming or participation in water activities or provide baby-sitting services while on Pain, Sleep and Anxiety Medications; until their outpatient Physician has advised to do so again. Also recommended to not to take more than prescribed Pain, Sleep and Anxiety Medications.   Consultants: None Procedures performed: None Disposition: Home Diet recommendation:  Discharge Diet Orders (From admission, onward)     Start     Ordered   01/29/22 0000  Diet general        01/29/22 1415           Cardiac and Carb modified diet DISCHARGE MEDICATION: Allergies as of 01/29/2022       Reactions   Lipitor [atorvastatin]    Infection on leg caused by Lipitor, takes Crestor at home   Lisinopril Other (See Comments)   unknown   Pioglitazone    Other reaction(s): Unknown        Medication List     STOP taking these medications    irbesartan-hydrochlorothiazide 150-12.5 MG tablet Commonly known as: AVALIDE   meloxicam 7.5 MG tablet Commonly known as: MOBIC       TAKE these medications    Accu-Chek Aviva Plus test strip Generic drug: glucose blood   Accu-Chek FastClix Lancets Misc   acetaminophen 325 MG tablet Commonly known as: TYLENOL Take 2 tablets (650 mg  total) by mouth every 6 (six) hours as needed for mild pain (or Fever >/= 101).   aspirin 81 MG tablet Take 81 mg by mouth daily.   cholecalciferol 1000 units tablet Commonly known as: VITAMIN D Take 1,000 Units by mouth daily.   docusate sodium 100 MG capsule Commonly known as: COLACE Take 1 capsule (100 mg total) by mouth 2 (two) times daily.   Farxiga 5 MG Tabs tablet Generic drug: dapagliflozin propanediol Take 10 mg by mouth daily.   irbesartan 150 MG tablet Commonly known as: AVAPRO Take 1 tablet (150 mg total) by mouth daily.   loratadine 10 MG tablet Commonly known as: CLARITIN Take 10 mg by mouth  daily.   polyethylene glycol 17 g packet Commonly known as: MIRALAX / GLYCOLAX Take 17 g by mouth daily. What changed:  when to take this additional instructions   rosuvastatin 5 MG tablet Commonly known as: CRESTOR Take 5 mg by mouth daily.        Discharge Exam: Filed Weights   01/27/22 1700  Weight: 94.3 kg   Vitals:   01/29/22 0741 01/29/22 1332  BP: 119/63 (!) 108/49  Pulse: 84 85  Resp: 18 18  Temp: 97.6 F (36.4 C) 97.7 F (36.5 C)  SpO2: 100% 100%   Examination: Physical Exam:  Constitutional: WN/WD obese African-American female currently no acute distress appears calm Respiratory: Mildly diminished to auscultation bilaterally, no wheezing, rales, rhonchi or crackles. Normal respiratory effort and patient is not tachypenic. No accessory muscle use.  Unlabored breathing Cardiovascular: RRR, no murmurs / rubs / gallops. S1 and S2 auscultated.  Abdomen: Soft, non-tender, distended secondary body habitus. Bowel sounds positive.  GU: Deferred. Musculoskeletal: No clubbing / cyanosis of digits/nails. No joint deformity upper and lower extremities.  Neurologic: CN 2-12 grossly intact with no focal deficits. Romberg sign and cerebellar reflexes not assessed.  Psychiatric: Normal judgment and insight. Alert and oriented x 3. Normal mood and appropriate affect.   Condition at discharge: stable  The results of significant diagnostics from this hospitalization (including imaging, microbiology, ancillary and laboratory) are listed below for reference.   Imaging Studies: CT ABDOMEN PELVIS W CONTRAST  Result Date: 01/27/2022 CLINICAL DATA:  Bowel obstruction suspected.  Constipation EXAM: CT ABDOMEN AND PELVIS WITH CONTRAST TECHNIQUE: Multidetector CT imaging of the abdomen and pelvis was performed using the standard protocol following bolus administration of intravenous contrast. RADIATION DOSE REDUCTION: This exam was performed according to the departmental  dose-optimization program which includes automated exposure control, adjustment of the mA and/or kV according to patient size and/or use of iterative reconstruction technique. CONTRAST:  70m OMNIPAQUE IOHEXOL 300 MG/ML  SOLN COMPARISON:  11/14/2006 FINDINGS: Lower chest: No acute abnormality. Hepatobiliary: No focal liver abnormality is seen. No gallstones, gallbladder wall thickening, or biliary dilatation. Pancreas: Mildly atrophic but otherwise unremarkable Spleen: Unremarkable Adrenals/Urinary Tract: The adrenal glands are unremarkable. The kidneys are normal in size and position. Multiple simple cortical cysts are seen within the kidneys bilaterally. No follow-up imaging is recommended for these lesions. No enhancing intrarenal masses are seen. No hydronephrosis. No intrarenal or ureteral calculi. The bladder is unremarkable. Stomach/Bowel: Stomach is within normal limits. Appendix appears normal. No evidence of bowel wall thickening, distention, or inflammatory changes. Small stool burden. Vascular/Lymphatic: Aortic atherosclerosis. No enlarged abdominal or pelvic lymph nodes. Reproductive: Uterus and bilateral adnexa are unremarkable. Other: Small fat containing umbilical hernia Musculoskeletal: No acute bone abnormality. No lytic or blastic bone lesions. IMPRESSION: No acute intra-abdominal pathology identified.  No definite radiographic explanation for the patient's reported symptoms. Small stool burden. Aortic Atherosclerosis (ICD10-I70.0). Electronically Signed   By: Fidela Salisbury M.D.   On: 01/27/2022 02:37    Microbiology: Results for orders placed or performed during the hospital encounter of 01/21/12  Surgical pcr screen     Status: Abnormal   Collection Time: 01/21/12 11:28 AM   Specimen: Nasal Mucosa; Nasal Swab  Result Value Ref Range Status   MRSA, PCR NEGATIVE NEGATIVE Final   Staphylococcus aureus POSITIVE (A) NEGATIVE Final    Comment:        The Xpert SA Assay (FDA approved for  NASAL specimens only), is one component of a comprehensive surveillance program.  It is not intended to diagnose infection nor to guide or monitor treatment.    Labs: CBC: Recent Labs  Lab 01/26/22 2249 01/27/22 0810 01/28/22 0122 01/29/22 0945  WBC 8.9 7.4 7.5 6.7  NEUTROABS 5.8 4.4 3.7 3.1  HGB 11.4* 11.4* 9.7* 11.2*  HCT 33.2* 32.6* 28.9* 33.8*  MCV 84.5 84.5 86.3 87.1  PLT 326 294 298 130   Basic Metabolic Panel: Recent Labs  Lab 01/26/22 2249 01/27/22 0810 01/27/22 1317 01/28/22 0122 01/29/22 0945  NA 115* 122* 123* 126* 129*  K 3.6 3.9 3.7 3.9 3.9  CL 76* 83* 86* 89* 92*  CO2 '22 26 26 28 27  '$ GLUCOSE 175* 131* 147* 106* 192*  BUN 27* 23 22 33* 37*  CREATININE 1.41* 1.20* 1.14* 1.14* 1.38*  CALCIUM 9.3 9.6 9.5 9.1 9.9  MG  --  1.7  --   --  1.9  PHOS  --   --   --   --  3.0   Liver Function Tests: Recent Labs  Lab 01/27/22 0810 01/29/22 0945  AST 40 38  ALT 21 23  ALKPHOS 53 52  BILITOT 1.2 0.5  PROT 7.0 7.3  ALBUMIN 3.5 3.3*   CBG: Recent Labs  Lab 01/28/22 1701 01/28/22 2113 01/28/22 2355 01/29/22 0741 01/29/22 1156  GLUCAP 212* 292* 94 101* 179*   Discharge time spent: greater than 30 minutes.  Signed: Raiford Noble, DO [facility] 01/29/2022

## 2022-01-29 NOTE — Plan of Care (Signed)

## 2022-02-07 ENCOUNTER — Ambulatory Visit (INDEPENDENT_AMBULATORY_CARE_PROVIDER_SITE_OTHER): Payer: Medicare HMO | Admitting: Podiatry

## 2022-02-07 DIAGNOSIS — M79675 Pain in left toe(s): Secondary | ICD-10-CM

## 2022-02-07 DIAGNOSIS — E1142 Type 2 diabetes mellitus with diabetic polyneuropathy: Secondary | ICD-10-CM | POA: Diagnosis not present

## 2022-02-07 DIAGNOSIS — B351 Tinea unguium: Secondary | ICD-10-CM

## 2022-02-07 DIAGNOSIS — M79674 Pain in right toe(s): Secondary | ICD-10-CM | POA: Diagnosis not present

## 2022-02-11 ENCOUNTER — Encounter: Payer: Self-pay | Admitting: Podiatry

## 2022-02-11 NOTE — Progress Notes (Signed)
  Subjective:  Patient ID: Samantha [name], female    DOB: Jan 13, 1946,  MRN: [id number]  LORANA MAFFEO presents to clinic today for at risk foot care with history of diabetic neuropathy and painful thick toenails that are difficult to trim. Pain interferes with ambulation. Aggravating factors include wearing enclosed shoe gear. Pain is relieved with periodic professional debridement.  Patient states blood glucose was 157 mg/dl today.  Last known HgA1c was unknown.  New problem(s): None.   PCP is Vonna Drafts, FNP , and last visit was April, 2023.  Allergies  Allergen Reactions   Lipitor [Atorvastatin]     Infection on leg caused by Lipitor, takes Crestor at home   Lisinopril Other (See Comments)    unknown   Pioglitazone     Other reaction(s): Unknown    Review of Systems: Negative except as noted in the HPI.  Objective: There were no vitals filed for this visit.  [name] is a pleasant 76 y.o. female in NAD. AAO X 3.  Vascular Examination: CFT <3 seconds b/l LE. Palpable DP pulse(s) b/l LE. Diminished PT pulse(s) b/l LE. Pedal hair absent. No pain with calf compression b/l. Lower extremity skin temperature gradient within normal limits. No ischemia or gangrene noted b/l LE. No cyanosis or clubbing noted b/l LE.  Dermatological Examination: Pedal integument with normal turgor, texture and tone BLE. No open wounds b/l LE. No interdigital macerations noted b/l LE. Toenails 1-5 bilaterally elongated, discolored, dystrophic, thickened, and crumbly with subungual debris and tenderness to dorsal palpation. No hyperkeratotic nor porokeratotic lesions present on today's visit.  Neurological Examination: Protective sensation diminished with 10g monofilament b/l.  Musculoskeletal Examination: Muscle strength 5/5 to all lower extremity muscle groups bilaterally. HAV with bunion deformity noted b/l LE. Hammertoe deformity noted 2-5 b/l.  Assessment/Plan: 1. Pain due to  onychomycosis of toenails of both feet   2. Diabetic peripheral neuropathy associated with type 2 diabetes mellitus ([place])   -Patient was evaluated and treated. All patient's and/or POA's questions/concerns answered on today's visit. -No new findings. No new orders. -Patient to continue soft, supportive shoe gear daily. -Toenails 1-5 b/l were debrided in length and girth with sterile nail nippers and dremel without iatrogenic bleeding.  -Patient/POA to call should there be question/concern in the interim.   Return in about 3 months (around 05/10/2022).  Marzetta Board, DPM

## 2022-05-23 ENCOUNTER — Ambulatory Visit: Payer: Medicare HMO | Admitting: Podiatry

## 2022-05-23 ENCOUNTER — Encounter: Payer: Self-pay | Admitting: Podiatry

## 2022-05-23 DIAGNOSIS — M79675 Pain in left toe(s): Secondary | ICD-10-CM

## 2022-05-23 DIAGNOSIS — B351 Tinea unguium: Secondary | ICD-10-CM

## 2022-05-23 DIAGNOSIS — M79674 Pain in right toe(s): Secondary | ICD-10-CM | POA: Diagnosis not present

## 2022-05-23 DIAGNOSIS — E1142 Type 2 diabetes mellitus with diabetic polyneuropathy: Secondary | ICD-10-CM

## 2022-05-27 NOTE — Progress Notes (Signed)
  Subjective:  Patient ID: Samantha [name], female    DOB: September 15, 1945,  MRN: [id number]  76 y.o. female presents at risk foot care with history of diabetic neuropathy and painful elongated mycotic toenails 1-5 bilaterally which are tender when wearing enclosed shoe gear. Pain is relieved with periodic professional debridement.  Patient states blood glucose was 183 mg/dl today. Last known  HgA1c was 7.4%.    New problem(s): None   PCP is Vonna Drafts, FNP , and last visit was June , 2023  Allergies  Allergen Reactions   Lipitor [Atorvastatin]     Infection on leg caused by Lipitor, takes Crestor at home   Lisinopril Other (See Comments)    unknown   Pioglitazone     Other reaction(s): Unknown    Review of Systems: Negative except as noted in the HPI.   Objective:    Vascular Examination: CFT <3 seconds b/l LE. Palpable DP pulse(s) b/l LE. Diminished PT pulse(s) b/l LE. Pedal hair absent. No pain with calf compression b/l. Lower extremity skin temperature gradient within normal limits. Trace edema noted BLE. No cyanosis or clubbing noted b/l LE.  Neurological Examination: Protective sensation diminished with 10g monofilament b/l.  Dermatological Examination: Pedal skin with normal turgor, texture and tone b/l. Toenails 1-5 b/l thick, discolored, elongated with subungual debris and pain on dorsal palpation. No hyperkeratotic lesions noted b/l.   Musculoskeletal Examination: Muscle strength 5/5 to b/l LE. HAV with bunion bilaterally and hammertoes 2-5 b/l.  Radiographs: None  Last A1c:       No data to display           Assessment:   1. Pain due to onychomycosis of toenails of both feet   2. Diabetic peripheral neuropathy associated with type 2 diabetes mellitus ([place])    Plan:  -Examined patient. -Discussed and educated patient on diabetic foot care, especially with  regards to the vascular, neurological and musculoskeletal systems. -Mycotic toenails 1-5  bilaterally were debrided in length and girth with sterile nail nippers and dremel without incident. -Patient/POA to call should there be question/concern in the interim.  Return in about 3 months (around 08/22/2022).  Marzetta Board, DPM

## 2022-08-24 ENCOUNTER — Ambulatory Visit (INDEPENDENT_AMBULATORY_CARE_PROVIDER_SITE_OTHER): Payer: Medicare HMO | Admitting: Podiatry

## 2022-08-24 ENCOUNTER — Encounter: Payer: Self-pay | Admitting: Podiatry

## 2022-08-24 VITALS — BP 124/61

## 2022-08-24 DIAGNOSIS — Z1211 Encounter for screening for malignant neoplasm of colon: Secondary | ICD-10-CM | POA: Insufficient documentation

## 2022-08-24 DIAGNOSIS — E1142 Type 2 diabetes mellitus with diabetic polyneuropathy: Secondary | ICD-10-CM | POA: Diagnosis not present

## 2022-08-24 DIAGNOSIS — M79675 Pain in left toe(s): Secondary | ICD-10-CM | POA: Diagnosis not present

## 2022-08-24 DIAGNOSIS — M79674 Pain in right toe(s): Secondary | ICD-10-CM

## 2022-08-24 DIAGNOSIS — L84 Corns and callosities: Secondary | ICD-10-CM | POA: Diagnosis not present

## 2022-08-24 DIAGNOSIS — B351 Tinea unguium: Secondary | ICD-10-CM

## 2022-08-24 NOTE — Progress Notes (Unsigned)
  Subjective:  Patient ID: Samantha [name], female    DOB: Feb 28, 1946,  MRN: [id number]  Samantha [name] presents to clinic today for {jgcomplaint:23593}  Chief Complaint  Patient presents with   Nail Problem    [facility] [id number] A1C-Do not know PCP-Beverly Brown PCP VST-2 weeks ago   New problem(s): None. {jgcomplaint:23593}  PCP is Willene Hatchet, NP.  Allergies  Allergen Reactions   Lipitor [Atorvastatin]     Infection on leg caused by Lipitor, takes Crestor at home   Lisinopril Other (See Comments)    unknown   Pioglitazone     Other reaction(s): Unknown    Review of Systems: Negative except as noted in the HPI.  Objective: No changes noted in today's physical examination. Vitals:   08/24/22 1105  BP: 124/61   [name] is a pleasant 76 y.o. female {jgbodyhabitus:24098} AAO x 3. Vascular Examination: CFT <3 seconds b/l LE. Palpable DP pulse(s) b/l LE. Diminished PT pulse(s) b/l LE. Pedal hair absent. No pain with calf compression b/l. Lower extremity skin temperature gradient within normal limits. Trace edema noted BLE. No cyanosis or clubbing noted b/l LE.  Neurological Examination: Protective sensation diminished with 10g monofilament b/l.  Dermatological Examination: Pedal skin with normal turgor, texture and tone b/l. Toenails 1-5 b/l thick, discolored, elongated with subungual debris and pain on dorsal palpation. No hyperkeratotic lesions noted b/l.   Musculoskeletal Examination: Muscle strength 5/5 to b/l LE. HAV with bunion bilaterally and hammertoes 2-5 b/l.  Radiographs: None Assessment/Plan: No diagnosis found.  No orders of the defined types were placed in this encounter.   None {Jgplan:23602::"-Patient/POA to call should there be question/concern in the interim."}   Return in about 3 months (around 11/23/2022).  Marzetta Board, DPM

## 2022-12-17 ENCOUNTER — Ambulatory Visit: Payer: Medicare HMO | Admitting: Podiatry

## 2022-12-19 ENCOUNTER — Ambulatory Visit: Payer: Medicare HMO | Admitting: Podiatry

## 2022-12-19 ENCOUNTER — Encounter: Payer: Self-pay | Admitting: Podiatry

## 2022-12-19 DIAGNOSIS — M2011 Hallux valgus (acquired), right foot: Secondary | ICD-10-CM | POA: Diagnosis not present

## 2022-12-19 DIAGNOSIS — L84 Corns and callosities: Secondary | ICD-10-CM | POA: Diagnosis not present

## 2022-12-19 DIAGNOSIS — B351 Tinea unguium: Secondary | ICD-10-CM | POA: Diagnosis not present

## 2022-12-19 DIAGNOSIS — M2041 Other hammer toe(s) (acquired), right foot: Secondary | ICD-10-CM | POA: Diagnosis not present

## 2022-12-19 DIAGNOSIS — E119 Type 2 diabetes mellitus without complications: Secondary | ICD-10-CM

## 2022-12-19 DIAGNOSIS — M2012 Hallux valgus (acquired), left foot: Secondary | ICD-10-CM

## 2022-12-19 DIAGNOSIS — M79675 Pain in left toe(s): Secondary | ICD-10-CM

## 2022-12-19 DIAGNOSIS — M2042 Other hammer toe(s) (acquired), left foot: Secondary | ICD-10-CM

## 2022-12-19 DIAGNOSIS — M79674 Pain in right toe(s): Secondary | ICD-10-CM | POA: Diagnosis not present

## 2022-12-19 DIAGNOSIS — E1142 Type 2 diabetes mellitus with diabetic polyneuropathy: Secondary | ICD-10-CM

## 2022-12-23 NOTE — Progress Notes (Signed)
ANNUAL DIABETIC FOOT EXAM  Subjective: Samantha [name] presents today for annual diabetic foot examination.  Chief Complaint  Patient presents with   Diabetes    [facility] BS - 114 A1C - DK LVPCP - 11/2022   Callouses    CALLUS BILAT    Nail Problem    PT THINKS SHE MAY HAVE A FOOT FUNGUS    Patient confirms h/o diabetes.  Patient denies any h/o foot wounds.  Patient has been diagnosed with neuropathy.  Risk factors: diabetes, diabetic neuropathy, HTN, hypercholesterolemia, h/o tobacco use in remission.  Estevan Oaks, NP is patient's PCP.  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   noted on CT angiogram of chest   Palpitations    Thymoma 01/23/2012   Patient Active Problem List   Diagnosis Date Noted   Colon cancer screening 08/24/2022   AKI (acute kidney injury) 01/28/2022   Normocytic anemia 01/28/2022   Constipation 01/28/2022   Acute hyponatremia 01/27/2022   Abnormal weight gain 10/28/2020   Gastroesophageal reflux disease 10/28/2020   Morbid obesity 10/28/2020   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   Thymoma 01/23/2012   Murmur 11/23/2011   Preop cardiovascular exam 11/23/2011   Hyponatremia 10/19/2011   Dyspnea 06/28/2011   Palpitations 06/28/2011   Dyspnea 06/28/2011   Diabetes mellitus type 2 in obese (HCC) 02/17/2007   DM W/NEURO MNFST, TYPE II, UNCONTROLLED 02/17/2007   TOBACCO USER 02/17/2007   HYPERTENSION, BENIGN ESSENTIAL 02/17/2007   HEMATURIA, MICROSCOPIC 02/17/2007   Past Surgical History:  Procedure Laterality Date   Kelman phacoemulsification cataract   2011   bilateral   MEDIASTERNOTOMY  01/23/2012   Procedure: PARTIAL MEDIASTERNOTOMY;  Surgeon: Purcell Nails, MD;  Location: [facility] OR;  Service: Thoracic;  Laterality: N/A;   WEDGE RESECTION  01/23/2012   Procedure: WEDGE RESECTION;  Surgeon: Purcell Nails, MD;  Location: [facility] OR;  Service: Thoracic;;  Wedge Resection Left Upper Lobe   Current Outpatient Medications on File Prior to Visit  Medication Sig Dispense Refill   ACCU-CHEK AVIVA PLUS test strip      ACCU-CHEK FASTCLIX LANCETS MISC      acetaminophen (TYLENOL) 325 MG tablet Take 2 tablets (650 mg total) by mouth every 6 (six) hours as needed for mild pain (or Fever >/= 101). 20 tablet 0   aspirin 81 MG tablet Take 81 mg by mouth daily.       cholecalciferol (VITAMIN D) 1000 UNITS tablet Take 1,000 Units by mouth daily.     docusate sodium (COLACE) 100 MG capsule Take 1 capsule (100 mg total) by mouth 2 (two) times daily. 10 capsule 0   FARXIGA 5 MG TABS tablet Take 10 mg by mouth daily.     irbesartan (AVAPRO) 150 MG tablet Take 1 tablet (150 mg total) by mouth daily. 30 tablet 0   loratadine (CLARITIN) 10 MG tablet Take 10 mg by mouth daily.       meloxicam (MOBIC) 7.5 MG tablet Take 7.5 mg  by mouth daily.     polyethylene glycol (MIRALAX / GLYCOLAX) 17 g packet Take 17 g by mouth daily. 14 each 0   rosuvastatin (CRESTOR) 5 MG tablet Take 5 mg by mouth daily.     Travoprost, BAK Free, (TRAVATAN) 0.004 % SOLN ophthalmic solution      No current facility-administered medications on file prior to visit.    Allergies  Allergen Reactions   Lipitor [Atorvastatin]     Infection on leg caused by Lipitor, takes Crestor at home   Lisinopril Other (See Comments)    unknown   Pioglitazone     Other reaction(s): Unknown   Social History   Occupational History   Not on file  Tobacco Use   Smoking status: Former    Packs/day: 1.00    Years: 40.00    Additional pack years: 0.00    Total pack years: 40.00    Types: Cigarettes     Quit date: 2012    Years since quitting: 12.2   Smokeless tobacco: Never   Tobacco comments:    quit in Jan 2012  Substance and Sexual Activity   Alcohol use: Yes    Comment: rare   Drug use: No   Sexual activity: Never    Birth control/protection: Post-menopausal   Family History  Problem Relation Age of Onset   Anesthesia problems Neg Hx    Hypotension Neg Hx    Malignant hyperthermia Neg Hx    Pseudochol deficiency Neg Hx    Diabetes Other    Immunization History  Administered Date(s) Administered   Influenza, High Dose Seasonal PF 05/18/2017, 06/06/2019   Pneumococcal Polysaccharide-23 09/14/2005   Td 09/14/2005     Review of Systems: Negative except as noted in the HPI.   Objective: There were no vitals filed for this visit.  [name] is a pleasant 77 y.o. female in NAD. AAO X 3.  Vascular Examination: CFT <3 seconds b/l. DP pulses faintly palpable b/l. PT pulses nonpalpable b/l. Digital hair absent. Skin temperature gradient warm to warm b/l. No pain with calf compression. No ischemia or gangrene. No cyanosis or clubbing noted b/l.    Neurological Examination: Protective sensation diminished with 10g monofilament b/l.  Dermatological Examination: Pedal skin warm and supple b/l. No open wounds b/l. No interdigital macerations. Toenails 1-5 b/l thick, discolored, elongated with subungual debris and pain on dorsal palpation.  Hyperkeratotic lesion(s) submet head 2 left foot and submet head 5 right foot.  No erythema, no edema, no drainage, no fluctuance.  Musculoskeletal Examination: Muscle strength 5/5 to b/l LE. HAV with bunion bilaterally and hammertoes 2-5 b/l.  Radiographs: None  Footwear Assessment: Does the patient wear appropriate shoes? Yes. Does the patient need inserts/orthotics? Yes.  Lab Results  Component Value Date   HGBA1C 6.6 (H) 10/18/2011   ADA Risk Categorization: High Risk  Patient has one or more of the following: Loss of  protective sensation Absent pedal pulses Severe Foot deformity History of foot ulcer  Assessment: 1. Pain due to onychomycosis of toenails of both feet   2. Callus   3. Hallux valgus, acquired, bilateral   4. Acquired hammertoes of both feet   5. Diabetic peripheral neuropathy associated with type 2 diabetes mellitus   6. Encounter for diabetic foot exam     Plan: -Consent given for treatment as described below: -Examined patient. -Diabetic foot examination performed today. -Continue diabetic foot care principles: inspect feet daily, monitor glucose as recommended by PCP and/or Endocrinologist,  and follow prescribed diet per PCP, Endocrinologist and/or dietician. -Patient to continue soft, supportive shoe gear daily. -Mycotic toenails 1-5 bilaterally were debrided in length and girth with sterile nail nippers and dremel without incident. -Callus(es) submet head 2 left foot and submet head 5 right foot pared utilizing sharp debridement with sterile blade without complication or incident. Total number debrided =2. -Patient/POA to call should there be question/concern in the interim. Return in about 3 months (around 03/20/2023).  Freddie Breech, DPM

## 2023-05-08 ENCOUNTER — Encounter: Payer: Self-pay | Admitting: Podiatry

## 2023-05-08 ENCOUNTER — Ambulatory Visit (INDEPENDENT_AMBULATORY_CARE_PROVIDER_SITE_OTHER): Payer: Medicare HMO | Admitting: Podiatry

## 2023-05-08 DIAGNOSIS — B351 Tinea unguium: Secondary | ICD-10-CM

## 2023-05-08 DIAGNOSIS — E1142 Type 2 diabetes mellitus with diabetic polyneuropathy: Secondary | ICD-10-CM

## 2023-05-08 DIAGNOSIS — L84 Corns and callosities: Secondary | ICD-10-CM

## 2023-05-08 DIAGNOSIS — M79674 Pain in right toe(s): Secondary | ICD-10-CM | POA: Diagnosis not present

## 2023-05-08 DIAGNOSIS — M79675 Pain in left toe(s): Secondary | ICD-10-CM | POA: Diagnosis not present

## 2023-05-11 NOTE — Progress Notes (Signed)
  Subjective:  Patient ID: Samantha [name], female    DOB: [birth year],  MRN: [id number]  Samantha [name] presents to clinic today for at risk foot care with history of diabetic neuropathy and callus(es) of both feet and painful thick toenails that are difficult to trim. Painful toenails interfere with ambulation. Aggravating factors include wearing enclosed shoe gear. Pain is relieved with periodic professional debridement. Painful calluses are aggravated when weightbearing with and without shoegear. Pain is relieved with periodic professional debridement.  Chief Complaint  Patient presents with   Nail Problem    A1C:6.6,DFC,Referring Provider Estevan Oaks, NP,lov:07/24,BS:121      New problem(s): None.   PCP is Estevan Oaks, NP.  Allergies  Allergen Reactions   Lipitor [Atorvastatin]     Infection on leg caused by Lipitor, takes Crestor at home   Lisinopril Other (See Comments)    unknown   Pioglitazone     Other reaction(s): Unknown    Review of Systems: Negative except as noted in the HPI.  Objective: No changes noted in today's physical examination. There were no vitals filed for this visit. Samantha [name] is a pleasant 78 y.o. female obese in NAD. AAO x 3.  Vascular Examination: CFT <3 seconds b/l. DP pulses faintly palpable b/l. PT pulses nonpalpable b/l. Digital hair absent. Skin temperature gradient warm to warm b/l. No pain with calf compression. No ischemia or gangrene. No cyanosis or clubbing noted b/l.    Neurological Examination: Protective sensation diminished with 10g monofilament b/l.  Dermatological Examination: Pedal skin warm and supple b/l. No open wounds b/l. No interdigital macerations. Toenails 1-5 b/l thick, discolored, elongated with subungual debris and pain on dorsal palpation.    Hyperkeratotic lesion(s) submet head 5 b/l and left hallux.  No erythema, no edema, no drainage, no fluctuance.  Musculoskeletal Examination: Muscle  strength 5/5 to b/l LE. HAV with bunion bilaterally and hammertoes 2-5 b/l.  Radiographs: None  Assessment/Plan: 1. Pain due to onychomycosis of toenails of both feet   2. Callus   3. Diabetic peripheral neuropathy associated with type 2 diabetes mellitus (HCC)     -Patient was evaluated and treated. All patient's and/or POA's questions/concerns answered on today's visit. -Continue foot and shoe inspections daily. Monitor blood glucose per PCP/Endocrinologist's recommendations. -Continue supportive shoe gear daily. -Toenails 1-5 b/l were debrided in length and girth with sterile nail nippers and dremel without iatrogenic bleeding.  -Callus(es) left great toe and submet head 5 b/l pared utilizing sterile scalpel blade without complication or incident. Total number debrided =3. -Patient/POA to call should there be question/concern in the interim.   Return in about 3 months (around 08/08/2023).  Freddie Breech, DPM

## 2023-08-20 ENCOUNTER — Encounter: Payer: Self-pay | Admitting: Podiatry

## 2023-08-20 ENCOUNTER — Ambulatory Visit (INDEPENDENT_AMBULATORY_CARE_PROVIDER_SITE_OTHER): Payer: Medicare HMO | Admitting: Podiatry

## 2023-08-20 DIAGNOSIS — L84 Corns and callosities: Secondary | ICD-10-CM

## 2023-08-20 DIAGNOSIS — M79674 Pain in right toe(s): Secondary | ICD-10-CM

## 2023-08-20 DIAGNOSIS — M79675 Pain in left toe(s): Secondary | ICD-10-CM

## 2023-08-20 DIAGNOSIS — B351 Tinea unguium: Secondary | ICD-10-CM | POA: Diagnosis not present

## 2023-08-20 DIAGNOSIS — E1151 Type 2 diabetes mellitus with diabetic peripheral angiopathy without gangrene: Secondary | ICD-10-CM | POA: Diagnosis not present

## 2023-08-20 NOTE — Progress Notes (Unsigned)
    Subjective:  Patient ID: Samantha [name], female    DOB: May 01, 1946,  MRN: [id number]  Samantha [name] presents to clinic today for:  Chief Complaint  Patient presents with   [facility]    She would like a nail trim.   Patient notes nails are thick and elongated, causing pain in shoe gear when ambulating.  Patient has painful calluses bilateral submet 5.  PCP is Estevan Oaks, NP.  Last seen around 05/25/23.  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 female in NAD. AAO x 3.  Vascular Examination: Patient has palpable DP pulse, absent PT pulse bilateral.  Delayed capillary refill bilateral toes.  Sparse digital hair bilateral.  Proximal to distal cooling WNL bilateral.    Dermatological Examination: Interspaces are clear with no open lesions noted bilateral.  Skin is shiny and atrophic bilateral.  Nails are 3-10mm thick, with yellowish/brown discoloration, subungual debris and distal onycholysis x10.  There is pain with compression of nails x10.  There are hyperkeratotic lesions noted bilateral submet 5 .  Patient qualifies for at-risk foot care because of diabetes with PVD .  Assessment/Plan: 1. Pain due to onychomycosis of toenails of both feet   2. Callus    3. Type II diabetes mellitus with peripheral circulatory disorder (HCC)    Mycotic nails x10 were sharply debrided with sterile nail nippers and power debriding burr to decrease bulk and length.  Hyperkeratotic lesions x2 were shaved with #312 blade.  Return in about 3 months (around 11/18/2023) for [facility].   Clerance Lav, DPM, FACFAS [facility]     [address] [place], [place] [address]                Office [phone]  Fax [phone]

## 2023-08-22 ENCOUNTER — Ambulatory Visit
Admission: EM | Admit: 2023-08-22 | Discharge: 2023-08-22 | Disposition: A | Discharge status: Home / Self Care | Payer: Medicare HMO

## 2023-08-22 ENCOUNTER — Other Ambulatory Visit: Payer: Self-pay

## 2023-08-22 ENCOUNTER — Emergency Department (HOSPITAL_BASED_OUTPATIENT_CLINIC_OR_DEPARTMENT_OTHER): Payer: Medicare HMO | Admitting: Radiology

## 2023-08-22 ENCOUNTER — Emergency Department (HOSPITAL_BASED_OUTPATIENT_CLINIC_OR_DEPARTMENT_OTHER): Payer: Medicare HMO

## 2023-08-22 ENCOUNTER — Emergency Department (HOSPITAL_BASED_OUTPATIENT_CLINIC_OR_DEPARTMENT_OTHER)
Admission: EM | Admit: 2023-08-22 | Discharge: 2023-08-22 | Disposition: A | Discharge status: Home / Self Care | Payer: Medicare HMO | Attending: Emergency Medicine | Admitting: Emergency Medicine

## 2023-08-22 ENCOUNTER — Encounter (HOSPITAL_BASED_OUTPATIENT_CLINIC_OR_DEPARTMENT_OTHER): Payer: Self-pay | Admitting: Emergency Medicine

## 2023-08-22 DIAGNOSIS — Z79899 Other long term (current) drug therapy: Secondary | ICD-10-CM | POA: Diagnosis not present

## 2023-08-22 DIAGNOSIS — Z7984 Long term (current) use of oral hypoglycemic drugs: Secondary | ICD-10-CM | POA: Diagnosis not present

## 2023-08-22 DIAGNOSIS — E119 Type 2 diabetes mellitus without complications: Secondary | ICD-10-CM | POA: Diagnosis not present

## 2023-08-22 DIAGNOSIS — W19XXXA Unspecified fall, initial encounter: Secondary | ICD-10-CM

## 2023-08-22 DIAGNOSIS — Z7982 Long term (current) use of aspirin: Secondary | ICD-10-CM | POA: Insufficient documentation

## 2023-08-22 DIAGNOSIS — S0990XA Unspecified injury of head, initial encounter: Secondary | ICD-10-CM | POA: Diagnosis present

## 2023-08-22 DIAGNOSIS — S0081XA Abrasion of other part of head, initial encounter: Secondary | ICD-10-CM | POA: Diagnosis not present

## 2023-08-22 DIAGNOSIS — Z23 Encounter for immunization: Secondary | ICD-10-CM | POA: Diagnosis not present

## 2023-08-22 DIAGNOSIS — I10 Essential (primary) hypertension: Secondary | ICD-10-CM | POA: Diagnosis not present

## 2023-08-22 DIAGNOSIS — W06XXXA Fall from bed, initial encounter: Secondary | ICD-10-CM | POA: Diagnosis not present

## 2023-08-22 MED ORDER — TETANUS-DIPHTH-ACELL PERTUSSIS 5-2.5-18.5 LF-MCG/0.5 IM SUSY
0.5000 mL | PREFILLED_SYRINGE | Freq: Once | INTRAMUSCULAR | Status: AC
  Administered 2023-08-22: 0.5 mL via INTRAMUSCULAR
  Filled 2023-08-22: qty 0.5

## 2023-08-22 NOTE — ED Triage Notes (Signed)
"  I fell this morning, around 0630am, I was trying to get out of bed, and rolled out of bed onto floor, hitting left side of head and hurting left lower side of back" (onto carpet). No lacerations. Some abrasions around left eye and lower lid (no visual changes).

## 2023-08-22 NOTE — ED Provider Notes (Signed)
[place] EMERGENCY DEPARTMENT AT [facility] Provider Note   CSN: [id number] Arrival date & time: 08/22/23  1527     History  Chief Complaint  Patient presents with   Samantha [name]    Samantha [name] is a 77 y.o. female.  77 year old female with a history of hypertension, hyperlipidemia, chronic hyponatremia, and diabetes who presents to the emergency department after a fall.  Patient reports that she rolled out of bed and fell onto the ground.  Struck the left side of her face.  Also was having some left side pain afterwards.  Takes aspirin but no thinners.  No LOC.  No preceding symptoms.  Was on the ground for approximately 30 minutes before EMS was able to arrive and help her stand up.  Has been ambulatory since and is not having any leg or hip pain.  Denies any back pain or neck pain.  Takes a baby aspirin but no other blood thinners.  Went to urgent care who referred her to the emergency department.  Believes her last tetanus shot was over 10 years ago.        Home Medications Prior to Admission medications   Medication Sig Start Date End Date Taking? Authorizing Provider  ACCU-CHEK AVIVA PLUS test strip  11/03/13   [provider]  ACCU-CHEK FASTCLIX LANCETS MISC  10/29/13   [provider]  acetaminophen (TYLENOL) 325 MG tablet Take 2 tablets (650 mg total) by mouth every 6 (six) hours as needed for mild pain (or Fever >/= 101). 01/29/22   Marguerita Merles Latif, DO  aspirin 81 MG tablet Take 81 mg by mouth daily.      [provider]  cholecalciferol (VITAMIN D) 1000 UNITS tablet Take 1,000 Units by mouth daily.    [provider]  docusate sodium (COLACE) 100 MG capsule Take 1 capsule (100 mg total) by mouth 2 (two) times daily. 01/29/22   Sheikh, Omair Latif, DO  FARXIGA 10 MG TABS tablet Take 10 mg by mouth daily. 07/03/23   [provider]  FARXIGA 5 MG TABS tablet Take 10 mg by mouth daily. 10/09/21   [provider]   glimepiride (AMARYL) 1 MG tablet Take 1 mg by mouth daily. 06/15/23   [provider]  irbesartan (AVAPRO) 150 MG tablet Take 1 tablet (150 mg total) by mouth daily. 01/29/22   Marguerita Merles Latif, DO  loratadine (CLARITIN) 10 MG tablet Take 10 mg by mouth daily.      [provider]  meloxicam (MOBIC) 7.5 MG tablet Take 7.5 mg by mouth daily. 04/17/22   [provider]  polyethylene glycol (MIRALAX / GLYCOLAX) 17 g packet Take 17 g by mouth daily. 01/29/22   Marguerita Merles Latif, DO  rosuvastatin (CRESTOR) 5 MG tablet Take 5 mg by mouth daily. 04/25/16   [provider]  Travoprost, BAK Free, (TRAVATAN) 0.004 % SOLN ophthalmic solution     [provider]      Allergies    Lipitor [atorvastatin], Lisinopril, and Pioglitazone    Review of Systems   Review of Systems  Physical Exam Updated Vital Signs BP 115/62   Pulse 91   Temp 97.9 F (36.6 C) (Oral)   Resp 16   SpO2 100%  Physical Exam Constitutional:      General: She is not in acute distress.    Appearance: Normal appearance. She is not ill-appearing.  HENT:     Head: Normocephalic.     Comments: Small abrasion  to left face    Right Ear: External ear normal.     Left Ear: External ear normal.     Mouth/Throat:     Mouth: Mucous membranes are moist.     Pharynx: Oropharynx is clear.  Eyes:     Extraocular Movements: Extraocular movements intact.     Conjunctiva/sclera: Conjunctivae normal.     Pupils: Pupils are equal, round, and reactive to light.  Neck:     Comments: No C-spine midline tenderness to palpation Cardiovascular:     Rate and Rhythm: Normal rate and regular rhythm.     Pulses: Normal pulses.     Heart sounds: Normal heart sounds.  Pulmonary:     Effort: Pulmonary effort is normal. No respiratory distress.     Breath sounds: Normal breath sounds.  Abdominal:     General: Abdomen is flat. There is no distension.     Palpations: Abdomen is soft. There is no mass.      Tenderness: There is no abdominal tenderness. There is no guarding.  Musculoskeletal:        General: No deformity. Normal range of motion.     Cervical back: No rigidity or tenderness.     Comments: No tenderness to palpation of midline thoracic or lumbar spine.  No step-offs palpated.  No tenderness to palpation of chest wall.  No bruising noted.  No tenderness to palpation of bilateral clavicles.  No tenderness to palpation, bruising, or deformities noted of bilateral shoulders, elbows, wrists, hips, knees, or ankles.  Neurological:     General: No focal deficit present.     Mental Status: She is alert and oriented to person, place, and time. Mental status is at baseline.     Cranial Nerves: No cranial nerve deficit.     Sensory: No sensory deficit.     Motor: No weakness.     ED Results / Procedures / Treatments   Labs (all labs ordered are listed, but only abnormal results are displayed) Labs Reviewed - No data to display  EKG None  Radiology DG Ribs Unilateral W/Chest Left Result Date: 08/22/2023 CLINICAL DATA:  Left chest pain after fall. EXAM: LEFT RIBS AND CHEST - 3+ VIEW COMPARISON:  March 24, 2012. FINDINGS: No fracture or other bone lesions are seen involving the ribs. There is no evidence of pneumothorax or pleural effusion. Both lungs are clear. Heart size and mediastinal contours are within normal limits. IMPRESSION: Negative. Electronically Signed   By: Lupita Raider M.D.   On: 08/22/2023 17:19   CT Head Wo Contrast Result Date: 08/22/2023 CLINICAL DATA:  History EXAM: CT HEAD WITHOUT CONTRAST CT CERVICAL SPINE WITHOUT CONTRAST TECHNIQUE: Multidetector CT imaging of the head and cervical spine was performed following the standard protocol without intravenous contrast. Multiplanar CT image reconstructions of the cervical spine were also generated. RADIATION DOSE REDUCTION: This exam was performed according to the departmental dose-optimization program which  includes automated exposure control, adjustment of the mA and/or kV according to patient size and/or use of iterative reconstruction technique. COMPARISON:  None Available. FINDINGS: CT HEAD FINDINGS Brain: No hemorrhage. No hydrocephalus. No extra-axial fluid collection. No CT evidence of an acute cortical infarct. No mass effect. No mass lesion. Is a background of mild chronic microvascular ischemic change Vascular: No hyperdense vessel or unexpected calcification. Skull: Normal. Negative for fracture or focal lesion. Sinuses/Orbits: No middle ear or mastoid effusion. Paranasal sinuses are clear. Bilateral lens replacement. Orbits are otherwise unremarkable. Other: None. CT CERVICAL  SPINE FINDINGS Alignment: Grade 1 anterolisthesis of C4 on C5. Skull base and vertebrae: No acute fracture. No primary bone lesion or focal pathologic process. Soft tissues and spinal canal: No prevertebral fluid or swelling. No visible canal hematoma. Disc levels:  No CT evidence of high-grade spinal canal stenosis Upper chest: Negative Other: 2.1 cm left thyroid nodule. Recommend further evaluation with a dedicated thyroid ultrasound, if not previously performed. IMPRESSION: 1. No acute intracranial abnormality. 2. No acute fracture or traumatic subluxation of the cervical spine. 3. 2.1 cm left thyroid nodule. Recommend further evaluation with a dedicated thyroid ultrasound, if not previously performed. Electronically Signed   By: Lorenza Cambridge M.D.   On: 08/22/2023 16:50   CT Cervical Spine Wo Contrast Result Date: 08/22/2023 CLINICAL DATA:  History EXAM: CT HEAD WITHOUT CONTRAST CT CERVICAL SPINE WITHOUT CONTRAST TECHNIQUE: Multidetector CT imaging of the head and cervical spine was performed following the standard protocol without intravenous contrast. Multiplanar CT image reconstructions of the cervical spine were also generated. RADIATION DOSE REDUCTION: This exam was performed according to the departmental dose-optimization  program which includes automated exposure control, adjustment of the mA and/or kV according to patient size and/or use of iterative reconstruction technique. COMPARISON:  None Available. FINDINGS: CT HEAD FINDINGS Brain: No hemorrhage. No hydrocephalus. No extra-axial fluid collection. No CT evidence of an acute cortical infarct. No mass effect. No mass lesion. Is a background of mild chronic microvascular ischemic change Vascular: No hyperdense vessel or unexpected calcification. Skull: Normal. Negative for fracture or focal lesion. Sinuses/Orbits: No middle ear or mastoid effusion. Paranasal sinuses are clear. Bilateral lens replacement. Orbits are otherwise unremarkable. Other: None. CT CERVICAL SPINE FINDINGS Alignment: Grade 1 anterolisthesis of C4 on C5. Skull base and vertebrae: No acute fracture. No primary bone lesion or focal pathologic process. Soft tissues and spinal canal: No prevertebral fluid or swelling. No visible canal hematoma. Disc levels:  No CT evidence of high-grade spinal canal stenosis Upper chest: Negative Other: 2.1 cm left thyroid nodule. Recommend further evaluation with a dedicated thyroid ultrasound, if not previously performed. IMPRESSION: 1. No acute intracranial abnormality. 2. No acute fracture or traumatic subluxation of the cervical spine. 3. 2.1 cm left thyroid nodule. Recommend further evaluation with a dedicated thyroid ultrasound, if not previously performed. Electronically Signed   By: Lorenza Cambridge M.D.   On: 08/22/2023 16:50    Procedures Procedures    Medications Ordered in ED Medications  Tdap (BOOSTRIX) injection 0.5 mL (0.5 mLs Intramuscular Given 08/22/23 1609)    ED Course/ Medical Decision Making/ A&P                                 Medical Decision Making Amount and/or Complexity of Data Reviewed Radiology: ordered.  Risk Prescription drug management.   Samantha [name] is a 77 y.o. female with comorbidities that complicate the patient  evaluation including hypertension, hyperlipidemia, chronic hyponatremia, and diabetes who presents to the emergency department after a fall.    Initial Ddx:  TBI, concussion, C-spine injury, rib fracture, hip fracture  MDM/Course:  Patient presented to the emergency department after a fall.  Does have obvious head injury.  On aspirin but no other blood thinners.  Also complaining of left-sided pain.  Because of her age did have a CT of the head and C-spine which did not reveal acute abnormality.  Rib series without rib fractures.  Tetanus shot was updated.  Patient has been bearing weight and does not have any tenderness to palpation or deformity of the left leg making hip fracture highly unlikely.  Upon reevaluation the patient remained stable.  Will have her follow-up with her primary doctor in several days and take Tylenol for her pain.  This patient presents to the ED for concern of complaints listed in HPI, this involves an extensive number of treatment options, and is a complaint that carries with it a high risk of complications and morbidity. Disposition including potential need for admission considered.   Dispo: DC Home. Return precautions discussed including, but not limited to, those listed in the AVS. Allowed pt time to ask questions which were answered fully prior to dc.  Additional history obtained from daughter Records reviewed Outpatient Clinic Notes I independently reviewed the following imaging with scope of interpretation limited to determining acute life threatening conditions related to emergency care: CT Head and agree with the radiologist interpretation with the following exceptions: none I have reviewed the patients home medications and made adjustments as needed Social Determinants of health:  Elderly  Portions of this note were generated with Scientist, clinical (histocompatibility and immunogenetics). Dictation errors may occur despite best attempts at proofreading.     Final Clinical Impression(s) /  ED Diagnoses Final diagnoses:  Minor head trauma  Fall, initial encounter    Rx / DC Orders ED Discharge Orders     None         Rondel Baton, MD 08/23/23 1247

## 2023-08-22 NOTE — ED Provider Notes (Signed)
Patient here today for evaluation of head injury that occurred earlier this morning when she rolled out of bed and hit the left side of her face.  She denies any visual changes.  Given age and mechanism of injury recommended further evaluation in the emergency room.  Daughter who is here with her will transport via POV.   Tomi Bamberger, PA-C 08/22/23 1440

## 2023-08-22 NOTE — Discharge Instructions (Signed)
You were seen for your fall in the emergency department.   At home, please take Tylenol for pain.    Check your MyChart online for the results of any tests that had not resulted by the time you left the emergency department.   Follow-up with your primary doctor in 2-3 days regarding your visit.    Return immediately to the emergency department if you experience any of the following: Severe headache, vomiting, or any other concerning symptoms.    Thank you for visiting our Emergency Department. It was a pleasure taking care of you today.

## 2023-08-22 NOTE — ED Triage Notes (Signed)
Rolled out of bed this morning. Landed on left side. Abrasion above and below left eye. Went to UC for ongoing left flank, lower back pain. No pain in triage with or without palpation. Full ROM in upper and lower extremities. Ambulatory to triage. No thinners. No LOC. A+Ox4.

## 2023-08-22 NOTE — ED Triage Notes (Signed)
Last Glucose (todsay, this morning): 149.

## 2023-11-19 ENCOUNTER — Ambulatory Visit (INDEPENDENT_AMBULATORY_CARE_PROVIDER_SITE_OTHER): Payer: Medicare HMO | Admitting: Podiatry

## 2023-11-19 ENCOUNTER — Encounter: Payer: Self-pay | Admitting: Podiatry

## 2023-11-19 VITALS — Ht 62.5 in | Wt 207.9 lb

## 2023-11-19 DIAGNOSIS — E1151 Type 2 diabetes mellitus with diabetic peripheral angiopathy without gangrene: Secondary | ICD-10-CM

## 2023-11-19 DIAGNOSIS — B351 Tinea unguium: Secondary | ICD-10-CM | POA: Diagnosis not present

## 2023-11-19 DIAGNOSIS — L84 Corns and callosities: Secondary | ICD-10-CM

## 2023-11-19 DIAGNOSIS — M79674 Pain in right toe(s): Secondary | ICD-10-CM

## 2023-11-19 DIAGNOSIS — M79675 Pain in left toe(s): Secondary | ICD-10-CM

## 2023-11-19 NOTE — Progress Notes (Unsigned)
       Subjective:  Patient ID: Samantha [name], female    DOB: 11/11/1945,  MRN: [id number]  Samantha [name] presents to clinic today for:  Chief Complaint  Patient presents with   Nail Problem    [facility]   Patient notes nails are thick and elongated, causing pain in shoe gear when ambulating.  She also has painful calluses bilateral submet 5  PCP is Estevan Oaks, NP.  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 female in NAD. AAO x 3.  Vascular Examination: Patient has palpable DP pulse, absent PT pulse bilateral.  Delayed capillary refill bilateral toes.  Sparse digital hair bilateral.  Proximal to distal cooling WNL bilateral.    Dermatological Examination: Interspaces are clear with no open lesions noted bilateral.  Skin is shiny and atrophic bilateral.  Nails are 3-66mm thick, with yellowish/brown discoloration, subungual debris and distal onycholysis x10.  There is pain with compression of nails x10.  There are hyperkeratotic lesions noted bilateral submet 5.  Patient qualifies for at-risk foot care because of diabetes with PVD.  Assessment/Plan: 1. Pain due to onychomycosis of toenails of both feet   2. Callus   3. Type II  diabetes mellitus with peripheral circulatory disorder (HCC)    Mycotic nails x10 were sharply debrided with sterile nail nippers and power debriding burr to decrease bulk and length.  Hyperkeratotic lesions x 2 were shaved with #312 blade.   Return in about 3 months (around 02/19/2024) for [facility].   Clerance Lav, DPM, FACFAS [facility]     [address] [place], [place] [address]                Office [phone]  Fax [phone]

## 2023-11-20 ENCOUNTER — Ambulatory Visit: Payer: Medicare HMO | Admitting: Podiatry

## 2024-02-26 ENCOUNTER — Ambulatory Visit: Admitting: Podiatry
